# Patient Record
Sex: Male | Born: 1940 | Race: White | Hispanic: No | Marital: Single | Smoking: Former smoker
Health system: Southern US, Community
[De-identification: ages and names within clinical notes are randomized; demographics above are authoritative.]

## PROBLEM LIST (undated history)

## (undated) DIAGNOSIS — E785 Hyperlipidemia, unspecified: Secondary | ICD-10-CM

## (undated) DIAGNOSIS — N179 Acute kidney failure, unspecified: Secondary | ICD-10-CM

## (undated) DIAGNOSIS — R339 Retention of urine, unspecified: Secondary | ICD-10-CM

## (undated) DIAGNOSIS — I219 Acute myocardial infarction, unspecified: Secondary | ICD-10-CM

## (undated) DIAGNOSIS — M6282 Rhabdomyolysis: Secondary | ICD-10-CM

## (undated) DIAGNOSIS — R319 Hematuria, unspecified: Secondary | ICD-10-CM

## (undated) DIAGNOSIS — E119 Type 2 diabetes mellitus without complications: Secondary | ICD-10-CM

## (undated) DIAGNOSIS — I739 Peripheral vascular disease, unspecified: Secondary | ICD-10-CM

## (undated) DIAGNOSIS — I2510 Atherosclerotic heart disease of native coronary artery without angina pectoris: Secondary | ICD-10-CM

## (undated) DIAGNOSIS — K219 Gastro-esophageal reflux disease without esophagitis: Secondary | ICD-10-CM

## (undated) DIAGNOSIS — I10 Essential (primary) hypertension: Secondary | ICD-10-CM

## (undated) DIAGNOSIS — M6281 Muscle weakness (generalized): Secondary | ICD-10-CM

## (undated) DIAGNOSIS — R1310 Dysphagia, unspecified: Secondary | ICD-10-CM

## (undated) DIAGNOSIS — E870 Hyperosmolality and hypernatremia: Secondary | ICD-10-CM

## (undated) DIAGNOSIS — F419 Anxiety disorder, unspecified: Secondary | ICD-10-CM

## (undated) HISTORY — DX: Gastro-esophageal reflux disease without esophagitis: K21.9

## (undated) HISTORY — DX: Acute kidney failure, unspecified: N17.9

## (undated) HISTORY — DX: Essential (primary) hypertension: I10

## (undated) HISTORY — DX: Retention of urine, unspecified: R33.9

## (undated) HISTORY — DX: Hyperosmolality and hypernatremia: E87.0

## (undated) HISTORY — DX: Hyperlipidemia, unspecified: E78.5

## (undated) HISTORY — DX: Dysphagia, unspecified: R13.10

## (undated) HISTORY — PX: ABDOMINAL SURGERY: SHX537

## (undated) HISTORY — DX: Peripheral vascular disease, unspecified: I73.9

## (undated) HISTORY — DX: Rhabdomyolysis: M62.82

## (undated) HISTORY — DX: Muscle weakness (generalized): M62.81

## (undated) HISTORY — DX: Anxiety disorder, unspecified: F41.9

## (undated) HISTORY — DX: Type 2 diabetes mellitus without complications: E11.9

## (undated) HISTORY — DX: Acute myocardial infarction, unspecified: I21.9

## (undated) HISTORY — DX: Hematuria, unspecified: R31.9

## (undated) HISTORY — DX: Atherosclerotic heart disease of native coronary artery without angina pectoris: I25.10

## (undated) HISTORY — PX: OTHER SURGICAL HISTORY: SHX169

---

## 2011-01-10 ENCOUNTER — Inpatient Hospital Stay: Payer: Self-pay | Admitting: Internal Medicine

## 2011-01-10 DIAGNOSIS — I10 Essential (primary) hypertension: Secondary | ICD-10-CM

## 2011-01-18 LAB — PATHOLOGY REPORT

## 2011-01-21 LAB — PATHOLOGY REPORT

## 2011-01-24 ENCOUNTER — Emergency Department: Payer: Self-pay | Admitting: Emergency Medicine

## 2011-02-18 ENCOUNTER — Ambulatory Visit: Payer: Self-pay | Admitting: Vascular Surgery

## 2011-02-18 LAB — URINALYSIS, COMPLETE
Bacteria: NONE SEEN
Bilirubin,UR: NEGATIVE
Glucose,UR: NEGATIVE mg/dL (ref 0–75)
Nitrite: NEGATIVE
RBC,UR: <1 /HPF (ref 0–5)
Specific Gravity: 1.015 (ref 1.003–1.030)
WBC UR: 1 /HPF (ref 0–5)

## 2011-02-18 LAB — BASIC METABOLIC PANEL
BUN: 15 mg/dL (ref 7–18)
Calcium, Total: 9.6 mg/dL (ref 8.5–10.1)
Chloride: 100 mmol/L (ref 98–107)
Co2: 31 mmol/L (ref 21–32)
EGFR (Non-African Amer.): >60
Osmolality: 285 (ref 275–301)
Potassium: 3.8 mmol/L (ref 3.5–5.1)
Sodium: 141 mmol/L (ref 136–145)

## 2011-02-18 LAB — APTT: Activated PTT: 37.5 secs — ABNORMAL HIGH (ref 23.6–35.9)

## 2011-02-18 LAB — CBC
HCT: 32.6 % — ABNORMAL LOW (ref 40.0–52.0)
MCHC: 32.4 g/dL (ref 32.0–36.0)
RBC: 3.92 10*6/uL — ABNORMAL LOW (ref 4.40–5.90)
RDW: 14.7 % — ABNORMAL HIGH (ref 11.5–14.5)
WBC: 7.9 10*3/uL (ref 3.8–10.6)

## 2011-02-18 LAB — PROTIME-INR: INR: 1

## 2011-02-22 ENCOUNTER — Inpatient Hospital Stay: Payer: Self-pay | Admitting: Vascular Surgery

## 2011-02-24 LAB — CREATININE, SERUM
Creatinine: 0.56 mg/dL — ABNORMAL LOW (ref 0.60–1.30)
EGFR (Non-African Amer.): >60

## 2011-02-24 LAB — PLATELET COUNT: Platelet: 216 10*3/uL (ref 150–440)

## 2011-02-27 LAB — PATHOLOGY REPORT

## 2011-02-28 LAB — CREATININE, SERUM
Creatinine: 0.56 mg/dL — ABNORMAL LOW (ref 0.60–1.30)
EGFR (Non-African Amer.): >60

## 2011-03-19 ENCOUNTER — Ambulatory Visit: Payer: Self-pay | Admitting: Vascular Surgery

## 2011-03-19 LAB — BASIC METABOLIC PANEL
BUN: 9 mg/dL (ref 7–18)
Calcium, Total: 9.1 mg/dL (ref 8.5–10.1)
Chloride: 92 mmol/L — ABNORMAL LOW (ref 98–107)
Creatinine: 0.59 mg/dL — ABNORMAL LOW (ref 0.60–1.30)
EGFR (Non-African Amer.): >60
Sodium: 131 mmol/L — ABNORMAL LOW (ref 136–145)

## 2011-05-14 ENCOUNTER — Ambulatory Visit: Payer: Self-pay | Admitting: Podiatry

## 2011-05-14 LAB — CBC WITH DIFFERENTIAL/PLATELET
Basophil #: 0 10*3/uL (ref 0.0–0.1)
Basophil %: 0.3 %
Eosinophil #: 0.3 10*3/uL (ref 0.0–0.7)
Eosinophil %: 2.6 %
HGB: 11.4 g/dL — ABNORMAL LOW (ref 13.0–18.0)
Lymphocyte #: 1.3 10*3/uL (ref 1.0–3.6)
Lymphocyte %: 10.2 %
MCH: 26.7 pg (ref 26.0–34.0)
MCHC: 32.3 g/dL (ref 32.0–36.0)
MCV: 83 fL (ref 80–100)
Neutrophil #: 10.4 10*3/uL — ABNORMAL HIGH (ref 1.4–6.5)
Neutrophil %: 79 %
RBC: 4.27 10*6/uL — ABNORMAL LOW (ref 4.40–5.90)
WBC: 13.2 10*3/uL — ABNORMAL HIGH (ref 3.8–10.6)

## 2011-05-14 LAB — BASIC METABOLIC PANEL
Anion Gap: 7 (ref 7–16)
BUN: 22 mg/dL — ABNORMAL HIGH (ref 7–18)
Creatinine: 0.87 mg/dL (ref 0.60–1.30)
EGFR (African American): >60
EGFR (Non-African Amer.): >60
Glucose: 128 mg/dL — ABNORMAL HIGH (ref 65–99)
Osmolality: 284 (ref 275–301)

## 2011-05-15 ENCOUNTER — Ambulatory Visit: Payer: Self-pay | Admitting: Podiatry

## 2013-07-15 ENCOUNTER — Inpatient Hospital Stay: Payer: Self-pay | Admitting: Cardiothoracic Surgery

## 2013-07-15 LAB — BASIC METABOLIC PANEL
Anion Gap: 8 (ref 7–16)
BUN: 21 mg/dL — ABNORMAL HIGH (ref 7–18)
CO2: 29 mmol/L (ref 21–32)
Calcium, Total: 9.5 mg/dL (ref 8.5–10.1)
Chloride: 99 mmol/L (ref 98–107)
Creatinine: 1.1 mg/dL (ref 0.60–1.30)
EGFR (African American): >60
EGFR (Non-African Amer.): >60
Glucose: 275 mg/dL — ABNORMAL HIGH (ref 65–99)
Osmolality: 285 (ref 275–301)
Potassium: 4.2 mmol/L (ref 3.5–5.1)
SODIUM: 136 mmol/L (ref 136–145)

## 2013-07-15 LAB — CBC
HCT: 54.9 % — AB (ref 40.0–52.0)
HGB: 17.8 g/dL (ref 13.0–18.0)
MCH: 27.4 pg (ref 26.0–34.0)
MCHC: 32.4 g/dL (ref 32.0–36.0)
MCV: 84 fL (ref 80–100)
Platelet: 238 10*3/uL (ref 150–440)
RBC: 6.5 10*6/uL — ABNORMAL HIGH (ref 4.40–5.90)
RDW: 13.8 % (ref 11.5–14.5)
WBC: 15.4 10*3/uL — AB (ref 3.8–10.6)

## 2013-07-15 LAB — URINALYSIS, COMPLETE
BACTERIA: NONE SEEN
Bilirubin,UR: NEGATIVE
Glucose,UR: >=500 mg/dL (ref 0–75)
KETONE: NEGATIVE
NITRITE: POSITIVE
Ph: 7 (ref 4.5–8.0)
SPECIFIC GRAVITY: 1.009 (ref 1.003–1.030)
SQUAMOUS EPITHELIAL: NONE SEEN

## 2013-07-15 LAB — APTT: Activated PTT: 29 secs (ref 23.6–35.9)

## 2013-07-15 LAB — PROTIME-INR
INR: 1
PROTHROMBIN TIME: 12.6 s (ref 11.5–14.7)

## 2013-07-15 LAB — HEMOGLOBIN A1C: HEMOGLOBIN A1C: 9.9 % — AB (ref 4.2–6.3)

## 2013-07-17 LAB — CBC WITH DIFFERENTIAL/PLATELET
BASOS ABS: 0 10*3/uL (ref 0.0–0.1)
Basophil %: 0.5 %
Eosinophil #: 0.2 10*3/uL (ref 0.0–0.7)
Eosinophil %: 2.5 %
HCT: 41.8 % (ref 40.0–52.0)
HGB: 13.7 g/dL (ref 13.0–18.0)
Lymphocyte #: 0.6 10*3/uL — ABNORMAL LOW (ref 1.0–3.6)
Lymphocyte %: 6.5 %
MCH: 27.5 pg (ref 26.0–34.0)
MCHC: 32.7 g/dL (ref 32.0–36.0)
MCV: 84 fL (ref 80–100)
Monocyte #: 1.1 x10 3/mm — ABNORMAL HIGH (ref 0.2–1.0)
Monocyte %: 11.9 %
NEUTROS PCT: 78.6 %
Neutrophil #: 7.3 10*3/uL — ABNORMAL HIGH (ref 1.4–6.5)
Platelet: 139 10*3/uL — ABNORMAL LOW (ref 150–440)
RBC: 4.98 10*6/uL (ref 4.40–5.90)
RDW: 13.9 % (ref 11.5–14.5)
WBC: 9.3 10*3/uL (ref 3.8–10.6)

## 2013-07-19 LAB — URINE CULTURE

## 2013-09-04 IMAGING — US US EXTREM LOW VENOUS*R*
1 series · 17 of 24 positions shown · non-contrast
Comparison: none

REASON FOR EXAM: DVT
COMMENTS:

[Series 1: us extrem low venous*right* · 17 of 31 slices shown]
[im 1/31]
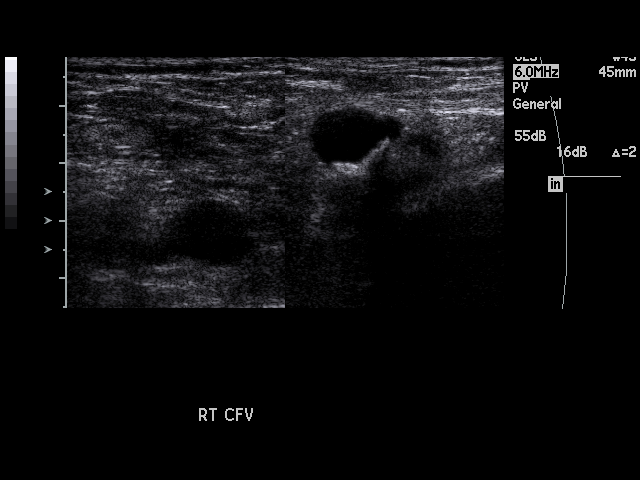
[im 3/31]
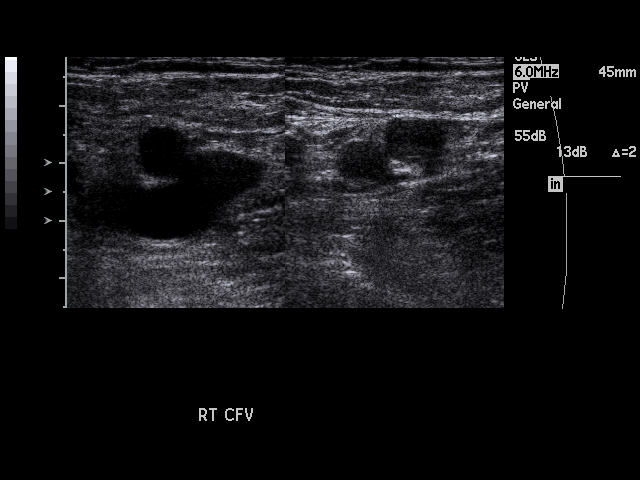
[im 4/31]
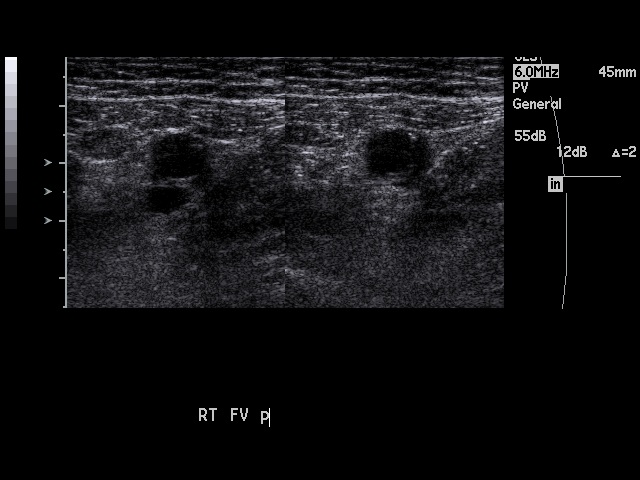
[im 6/31]
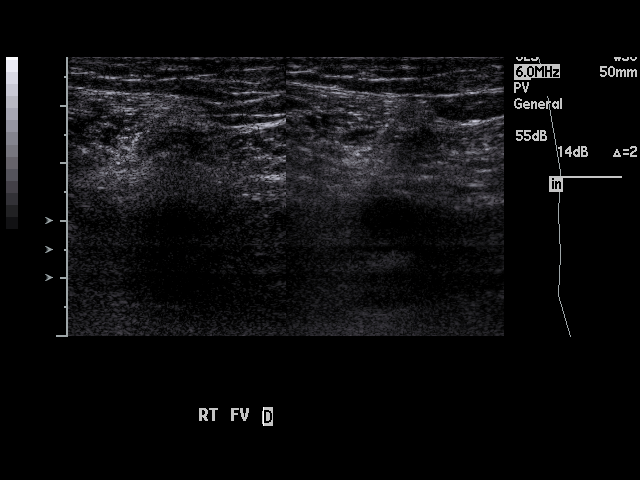
[im 8/31]
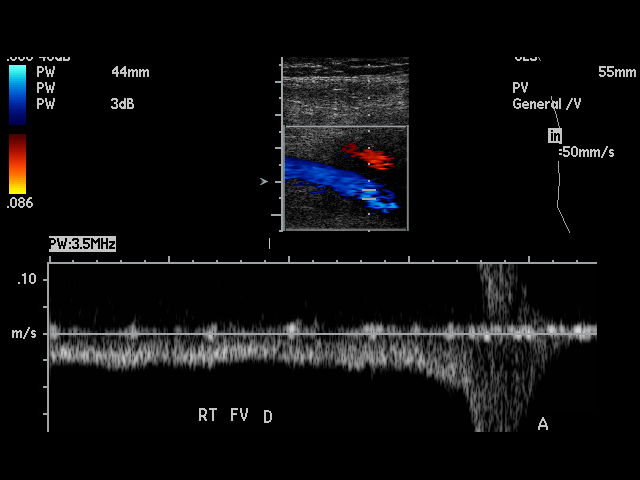
[im 10/31]
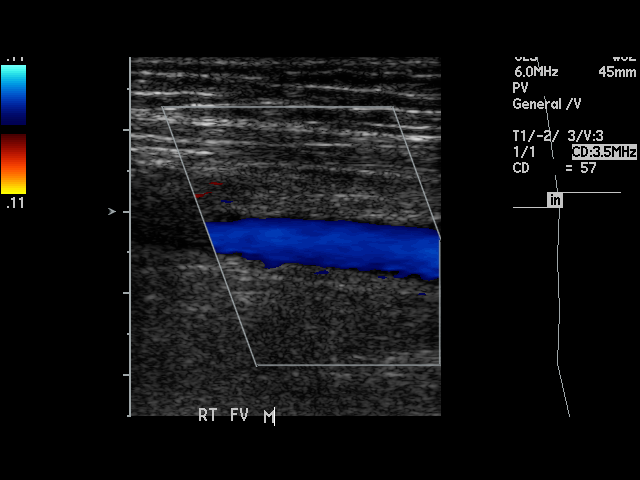
[im 12/31]
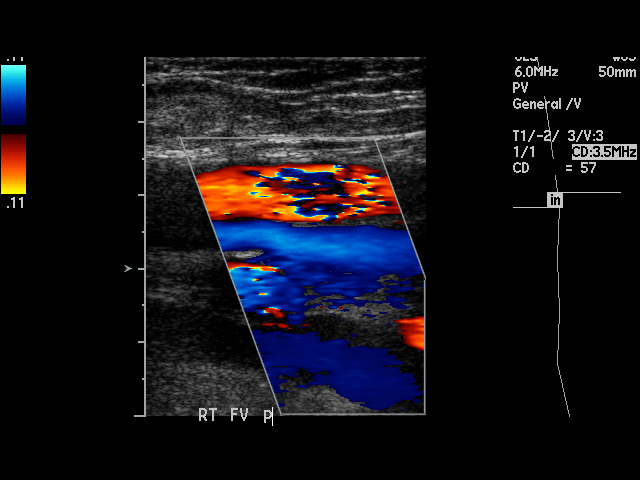
[im 14/31]
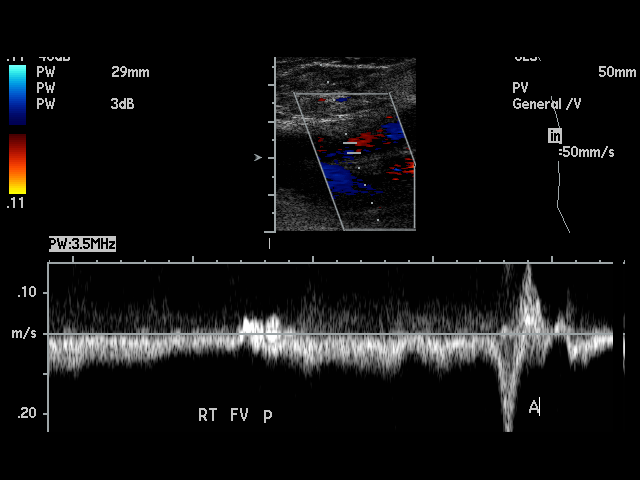
[im 16/31]
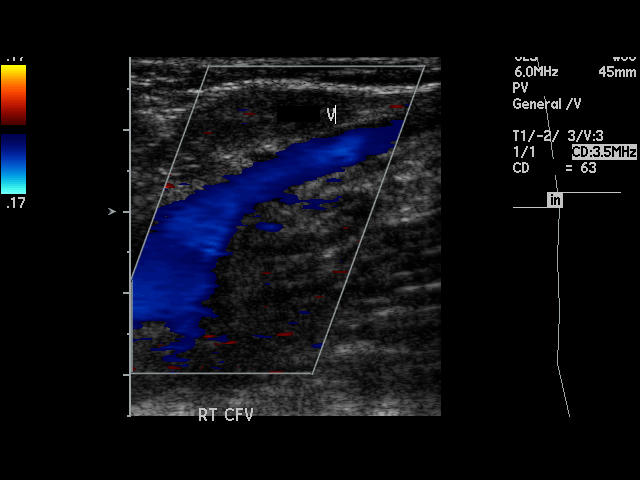
[im 17/31]
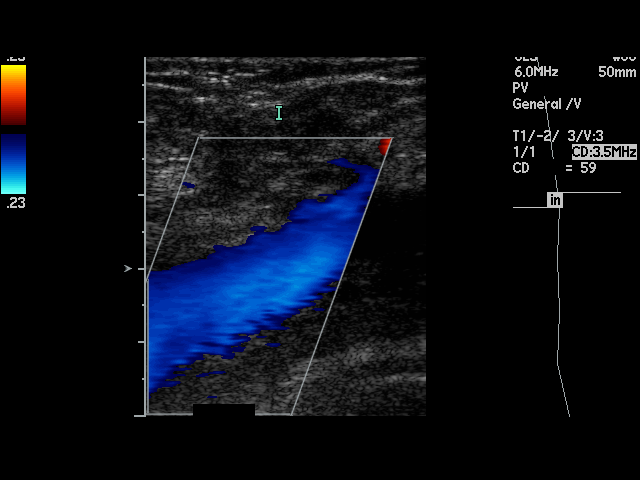
[im 19/31]
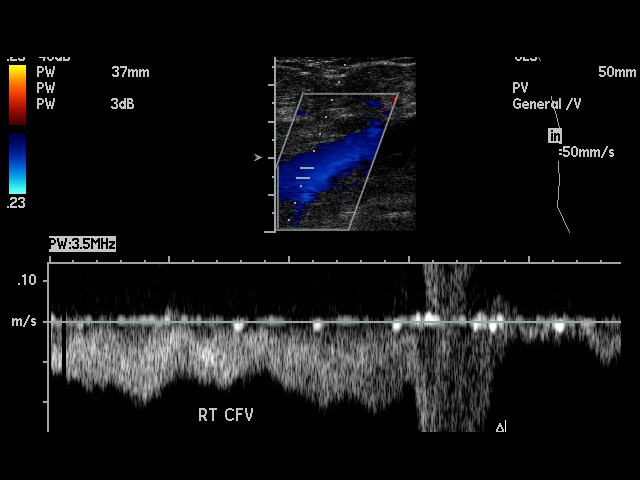
[im 21/31]
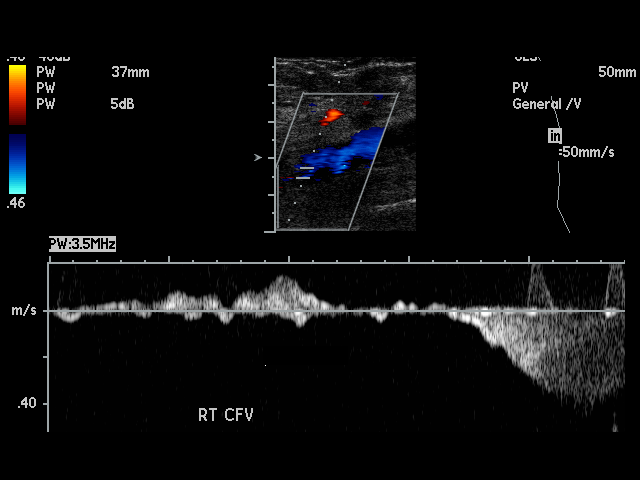
[im 23/31]
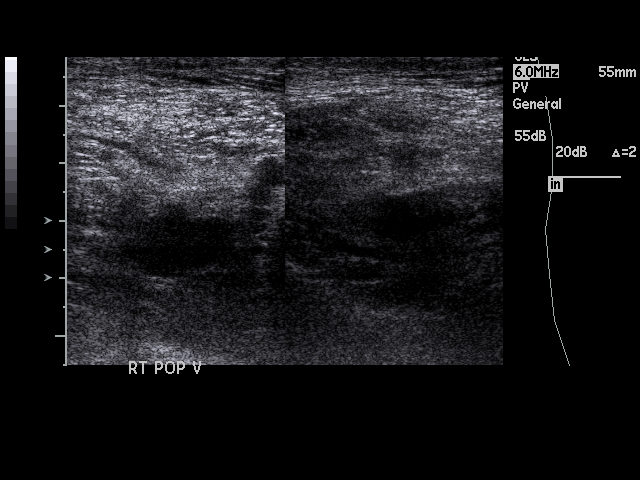
[im 25/31]
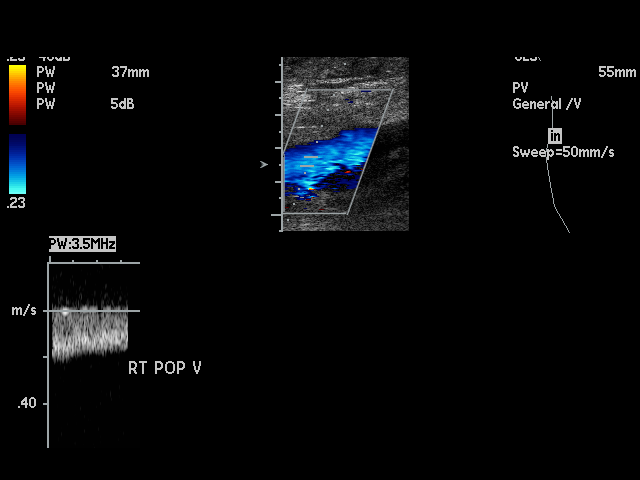
[im 27/31]
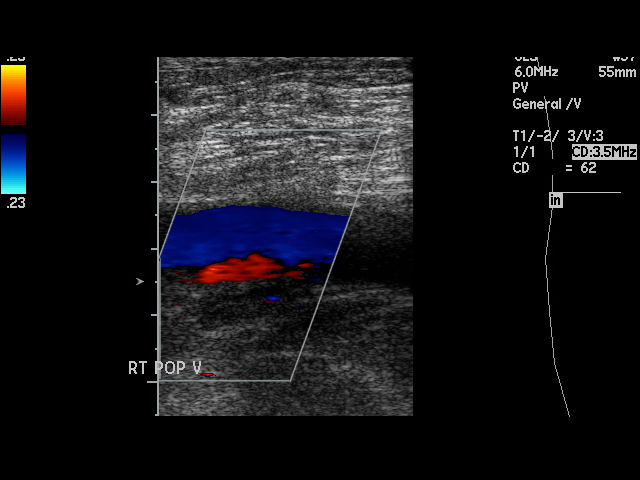
[im 28/31]
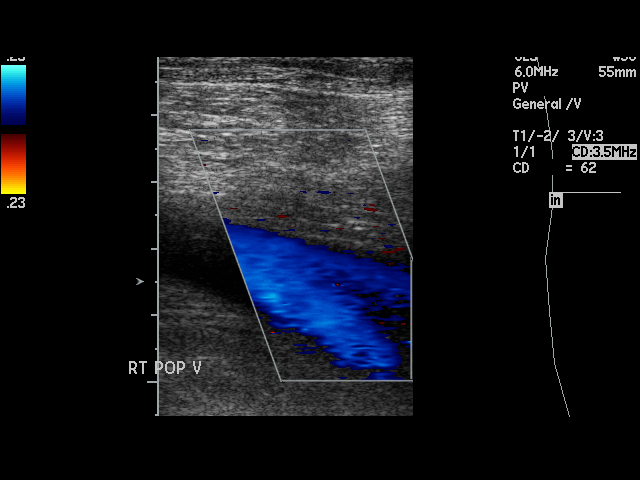
[im 31/31]
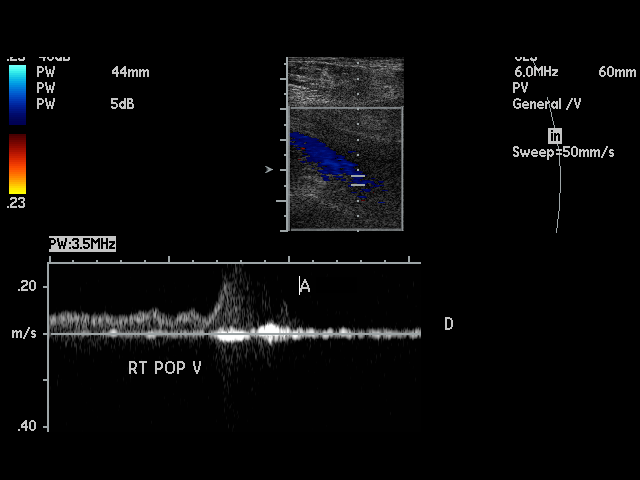

[17 of 24 positions shown; findings below may reference images not displayed]

PROCEDURE:     US  - US DOPPLER LOW EXTR RIGHT  - January 10, 2011  [DATE]

RESULT:     Comparison: None

Technique and findings: Multiple longitudinal and transverse grayscale as
well as color and spectral Doppler images of the right lower extremity veins
were obtained from the common femoral veins through the popliteal veins.

The right common femoral, femoral, and popliteal veins are patent,
demonstrating normal color-flow and compressibility. No intraluminal
thrombus is identified.  There is normal respiratory variation and
augmentation demonstrated at all vein levels.
IMPRESSION: No evidence of DVT in the right lower extremity.

## 2013-09-04 IMAGING — CR RIGHT FOOT COMPLETE - 3+ VIEW
3 series · 3 of 3 positions shown · non-contrast
Comparison: none

REASON FOR EXAM: osteomyelitis
COMMENTS:

PROCEDURE:     DXR - DXR FOOT RT COMPLETE W/OBLIQUES  - January 10, 2011 [DATE]
RESULT:     Comparison: None.

[ap]
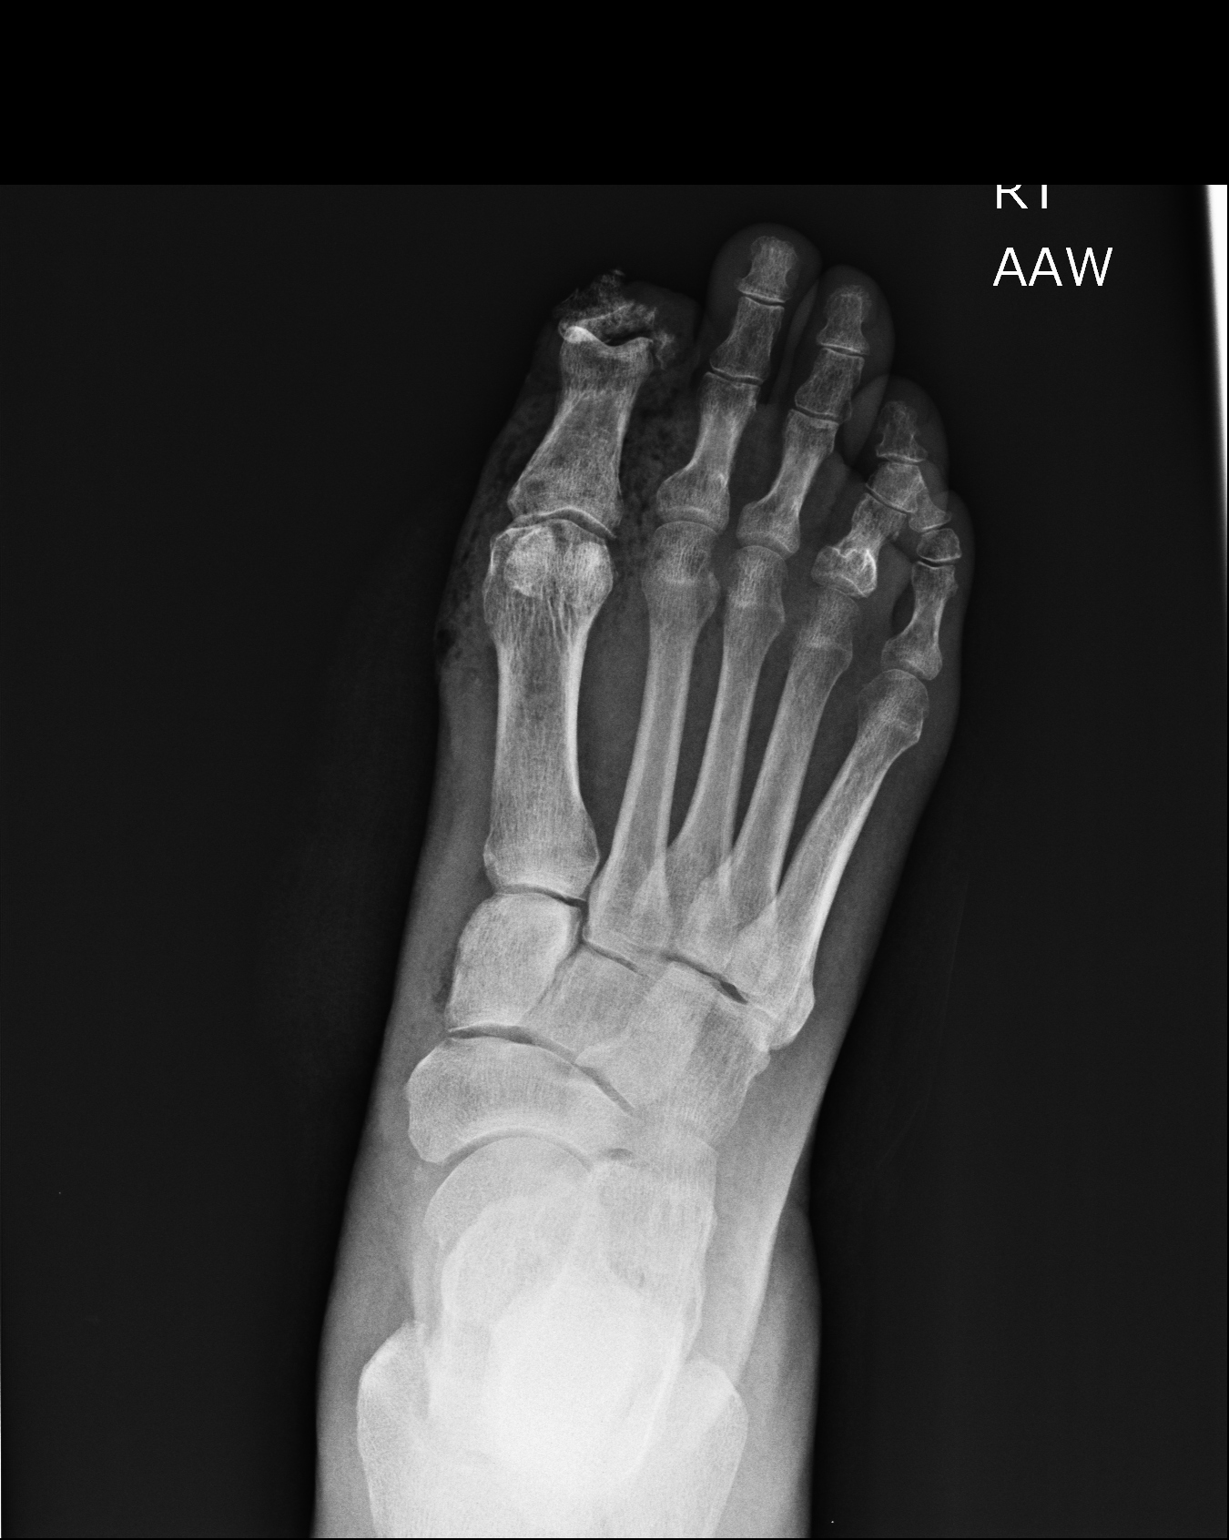

[oblique]
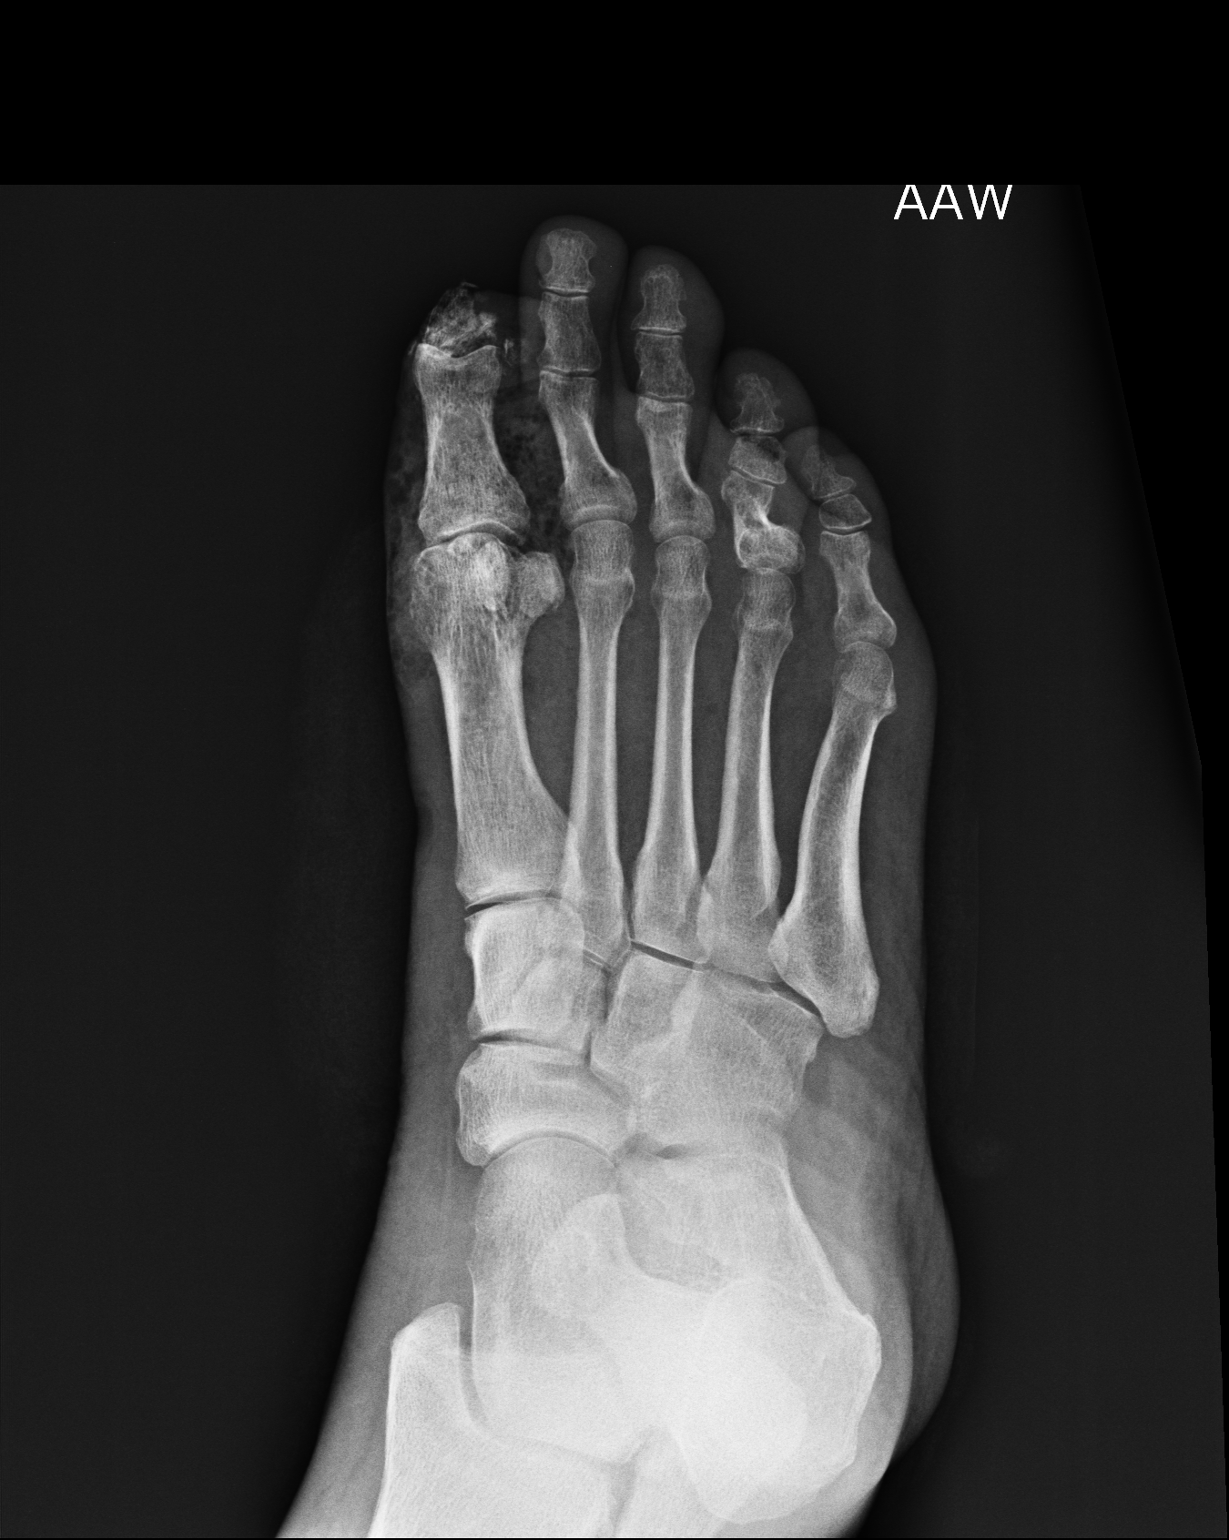

[lat]
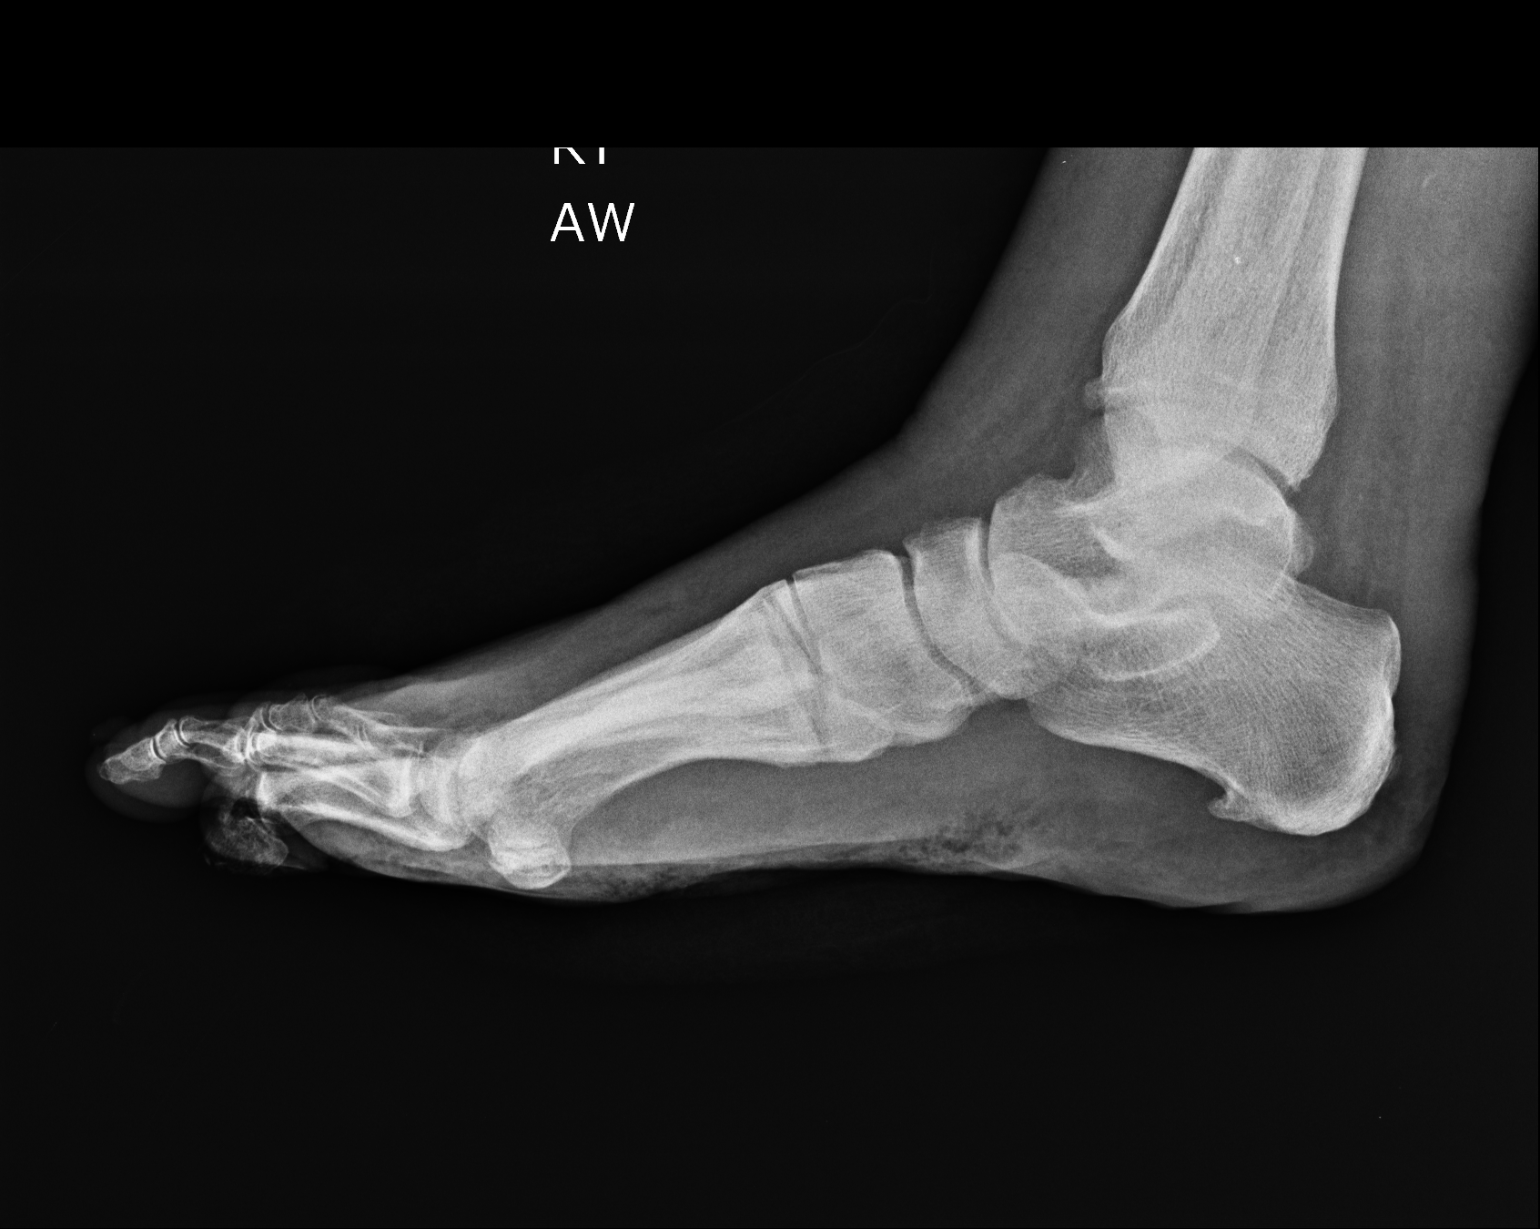

[3 of 3 positions shown; findings below may reference images not displayed]

FINDINGS: There is gas within the soft tissues of the great toe. This extends
proximally to the plantar aspect of the foot at the level of the middle
cuneiform. There is soft tissue defect of the great toe. There is truncation
of the distal phalanx of the great toe, which is partially exposed. There is
irregularity of the residual distal phalanx. This may represent
osteomyelitis or postoperative changes. Well corticated density lateral to
the proximal phalanx likely represent sequela of old prior trauma.
IMPRESSION: 1. Soft tissue gas in the great toe and medial foot, concerning for soft
tissue infection, including necrotizing infection such as necrotizing
fasciitis. Surgical consultation is recommended.
2. Truncation and irregularity of the distal phalanx of the great toe may be
secondary to osteomyelitis and/or postoperative changes.

This was called to Dr. Verna Lyon at 6647 hours 01/10/2011.

## 2013-09-04 IMAGING — US US CAROTID DUPLEX BILAT
1 series · 17 of 24 positions shown · non-contrast
Comparison: none

REASON FOR EXAM: CVA; PVD
COMMENTS:

PROCEDURE:     US  - US CAROTID DOPPLER BILATERAL  - January 10, 2011  [DATE]
RESULT:     Comparison: None
TECHNIQUE: Gray-scale, color Doppler, and spectral Doppler images were
obtained of the extracranial carotid artery systems and vertebral arteries
in the neck.

[Series 1: us carotid duplex bilat · 17 of 75 slices shown]
[im 1/75]
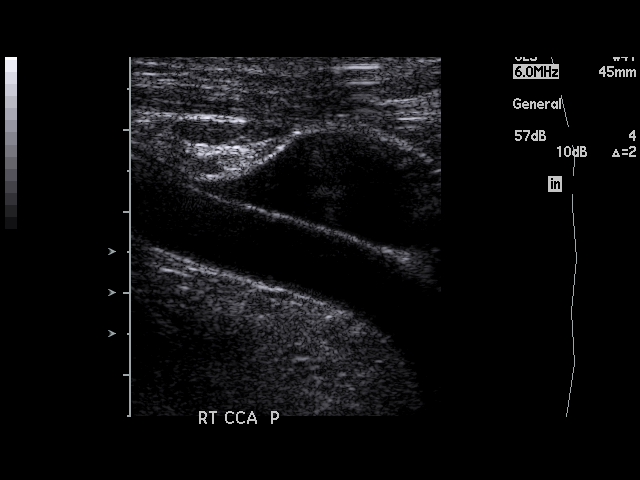
[im 7/75]
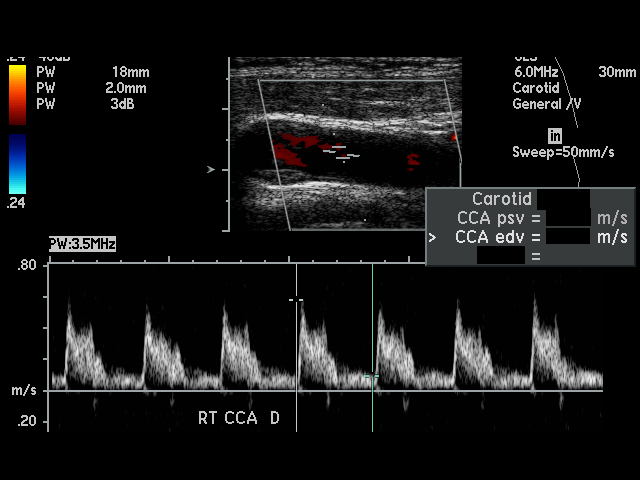
[im 10/75]
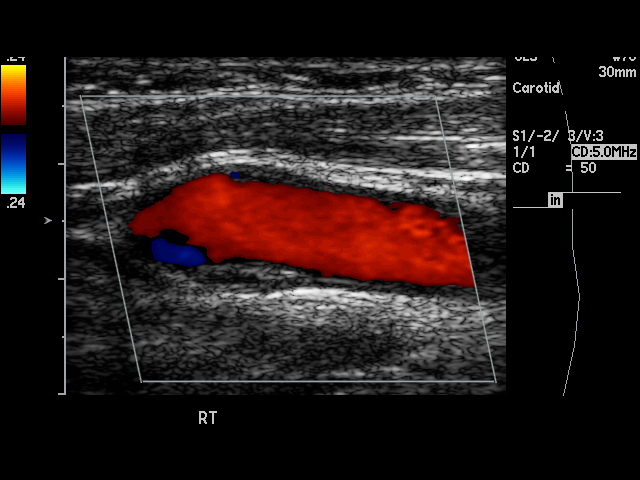
[im 13/75]
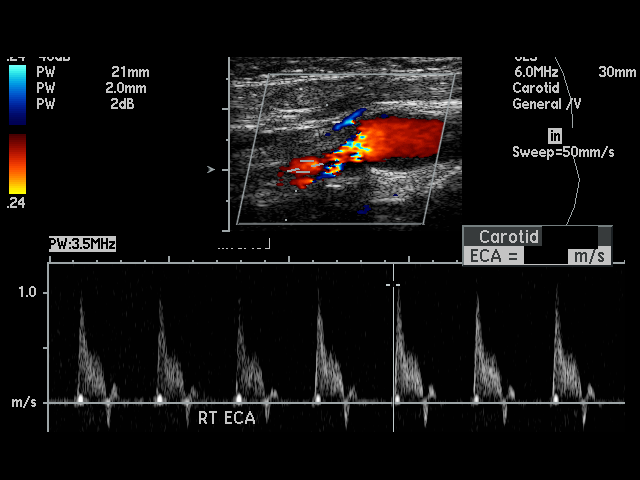
[im 20/75]
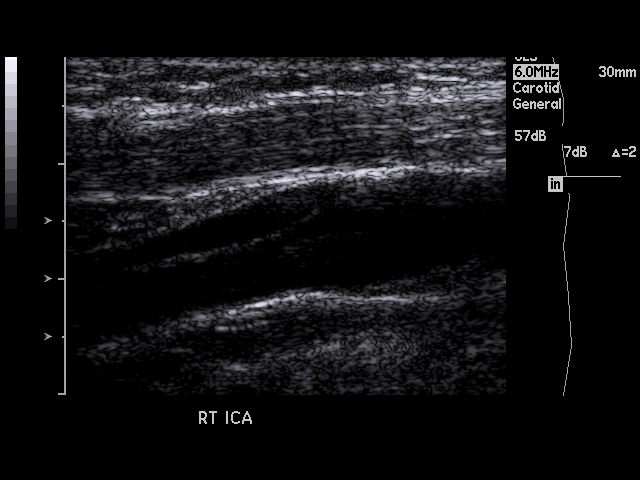
[im 23/75]
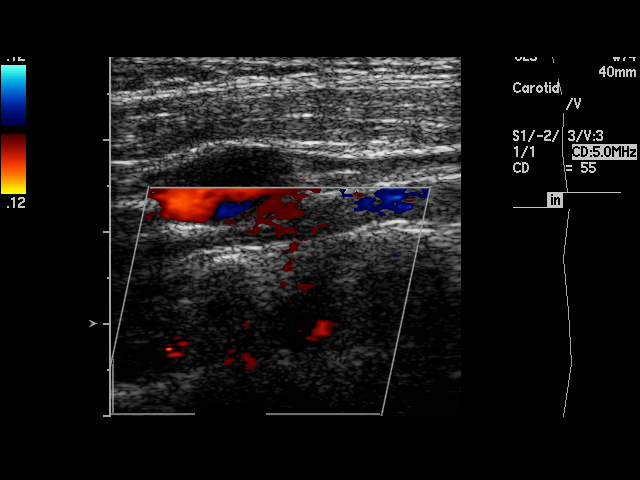
[im 29/75]
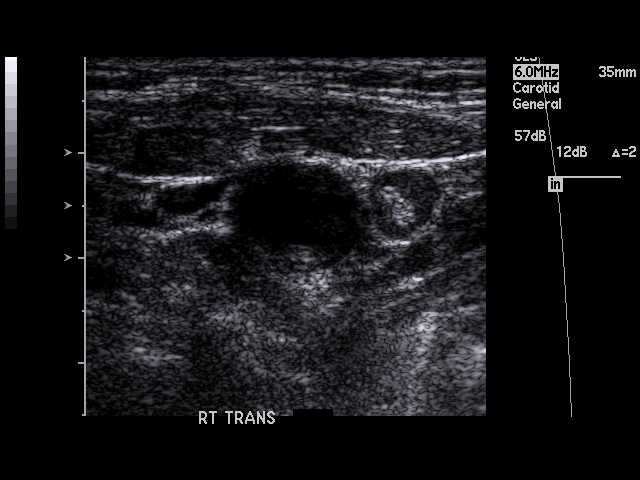
[im 33/75]
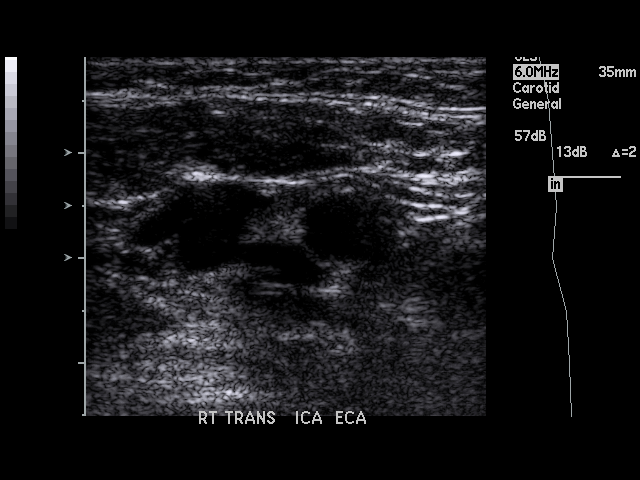
[im 39/75]
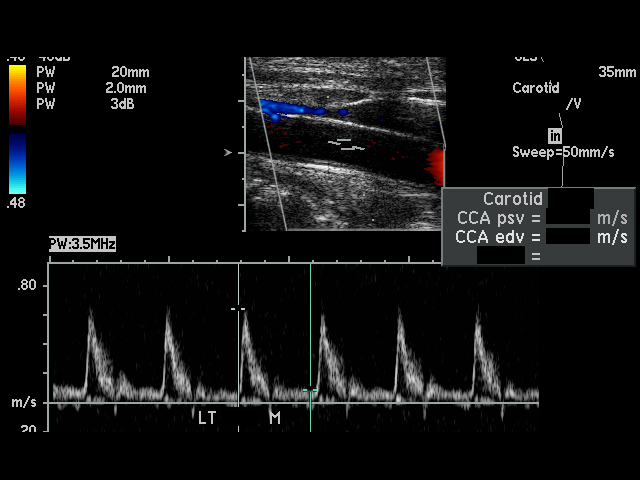
[im 42/75]
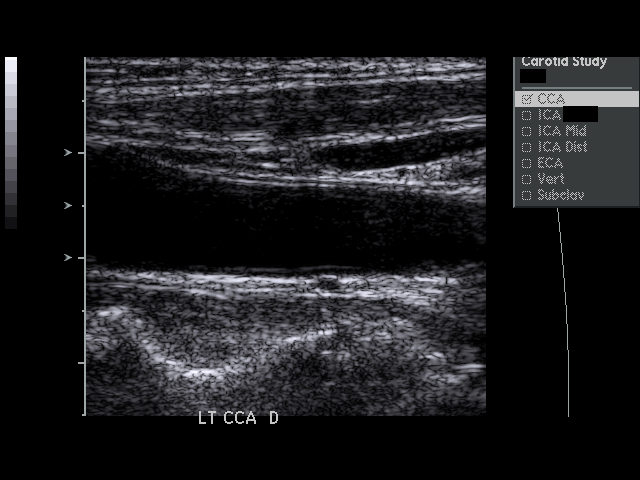
[im 46/75]
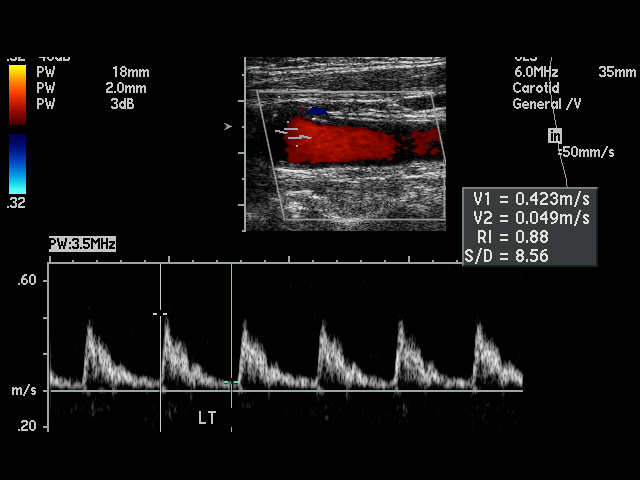
[im 52/75]
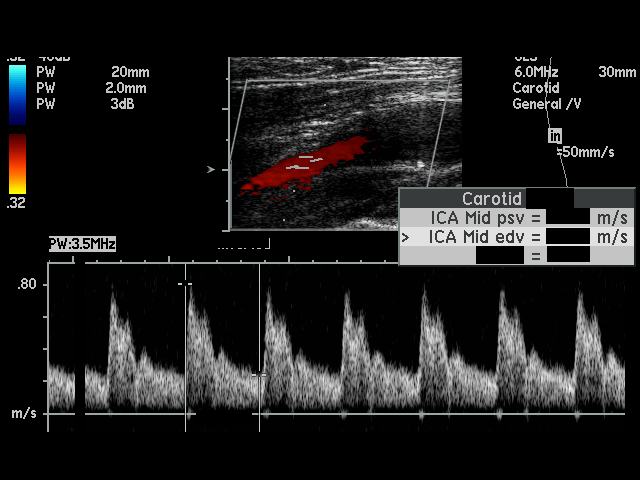
[im 55/75]
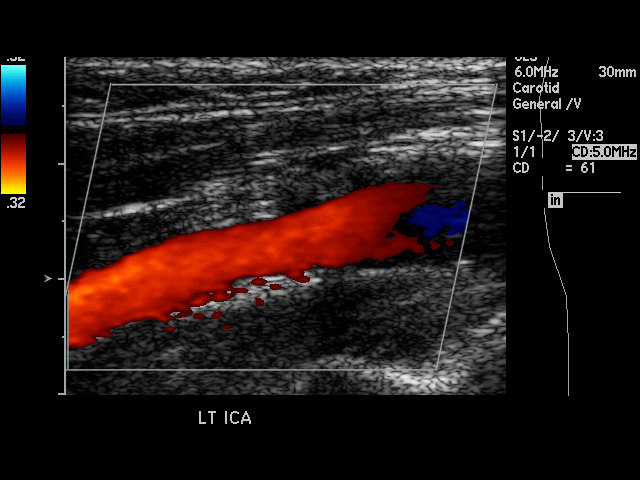
[im 62/75]
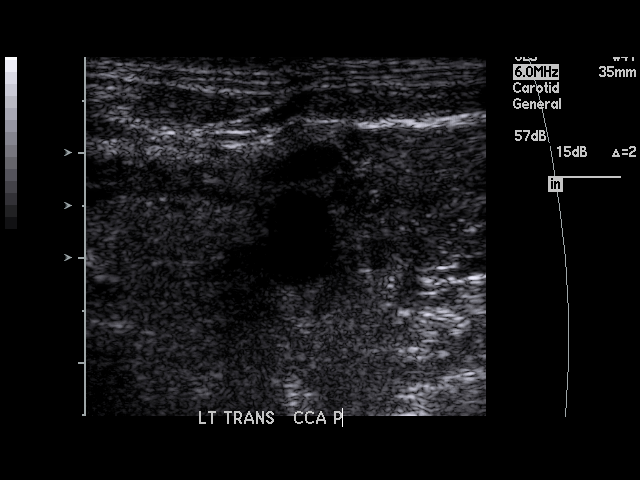
[im 65/75]
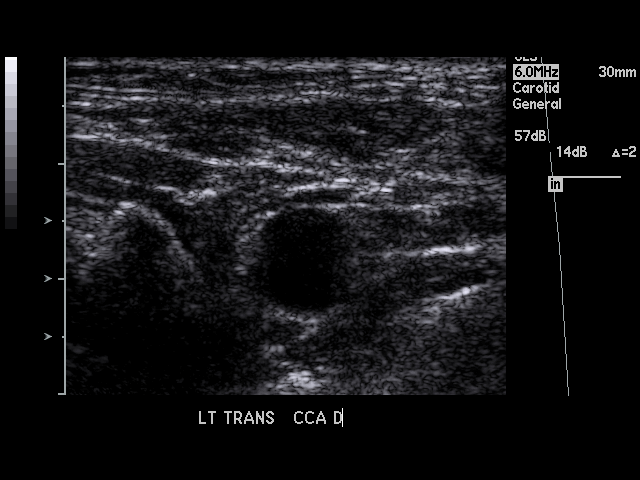
[im 68/75]
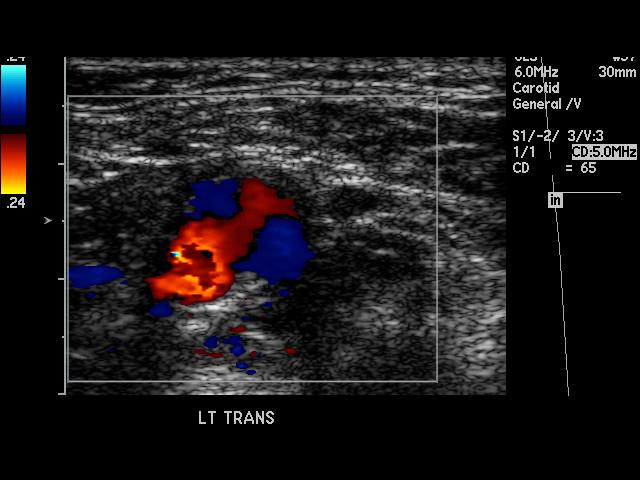
[im 75/75]
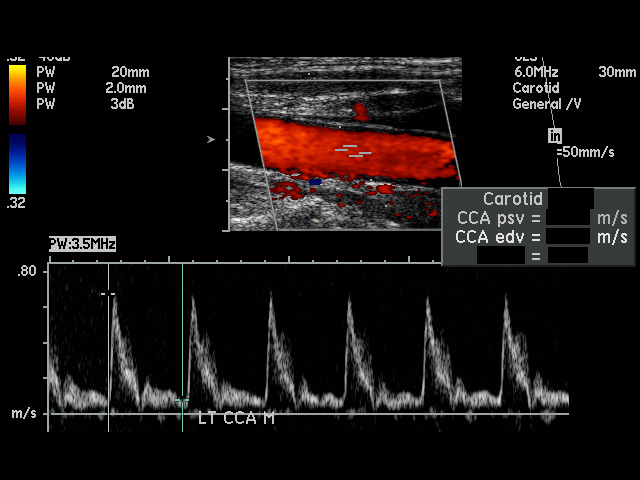

[17 of 24 positions shown; findings below may reference images not displayed]

FINDINGS: There are mild atherosclerotic plaques in the carotid bulbs and internal
carotid arteries. No significant atherosclerotic plaques identified in the
extracranial carotid arteries.  The peak systolic velocities are not
elevated.

The vertebral arteries are patent bilaterally.
IMPRESSION: No evidence of hemodynamically significant stenosis in the extracranial
carotid arteries.

## 2013-09-12 IMAGING — XA IR VASCULAR PROCEDURE
1 series · 1 of 1 positions shown · non-contrast
Comparison: none

[Series 2: single · 1 of 1 slices shown]
[im 1/1]
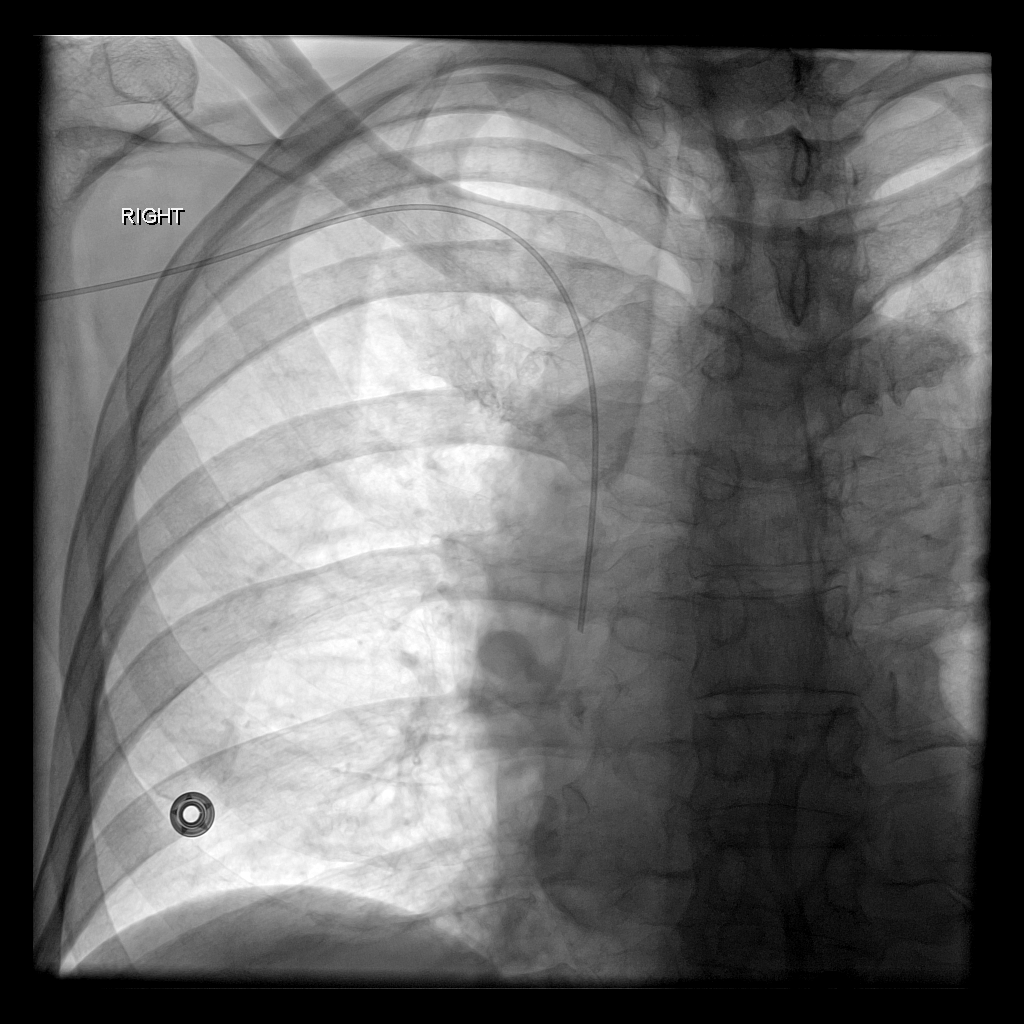

[1 of 1 positions shown; findings below may reference images not displayed]

IMAGES IMPORTED FROM THE [employer] WORKFLOW SYSTEM
NO DICTATION FOR STUDY

## 2013-12-12 ENCOUNTER — Ambulatory Visit: Payer: Self-pay | Admitting: Internal Medicine

## 2013-12-30 ENCOUNTER — Inpatient Hospital Stay: Payer: Self-pay | Admitting: Internal Medicine

## 2013-12-30 LAB — DIFFERENTIAL
Basophil #: 0 10*3/uL (ref 0.0–0.1)
Basophil %: 0.3 %
Eosinophil #: 0 10*3/uL (ref 0.0–0.7)
Eosinophil %: 0.1 %
Lymphocyte #: 0.3 10*3/uL — ABNORMAL LOW (ref 1.0–3.6)
Lymphocyte %: 1.6 %
Monocyte #: 0.5 x10 3/mm (ref 0.2–1.0)
Monocyte %: 3 %
NEUTROS ABS: 15.1 10*3/uL — AB (ref 1.4–6.5)
Neutrophil %: 95 %

## 2013-12-30 LAB — DRUG SCREEN, URINE

## 2013-12-30 LAB — TROPONIN I
Troponin-I: 29 ng/mL — ABNORMAL HIGH
Troponin-I: 16 ng/mL — ABNORMAL HIGH
Troponin-I: 21 ng/mL — ABNORMAL HIGH

## 2013-12-30 LAB — URINALYSIS, COMPLETE
RBC,UR: 2808 /HPF (ref 0–5)
Specific Gravity: 1.016 (ref 1.003–1.030)
Squamous Epithelial: NONE SEEN
WBC UR: 2128 /HPF (ref 0–5)

## 2013-12-30 LAB — COMPREHENSIVE METABOLIC PANEL
ALBUMIN: 2.9 g/dL — AB (ref 3.4–5.0)
ALK PHOS: 164 U/L — AB
Anion Gap: 17 — ABNORMAL HIGH (ref 7–16)
BILIRUBIN TOTAL: 2.2 mg/dL — AB (ref 0.2–1.0)
BUN: 67 mg/dL — AB (ref 7–18)
CHLORIDE: 105 mmol/L (ref 98–107)
Calcium, Total: 8.4 mg/dL — ABNORMAL LOW (ref 8.5–10.1)
Co2: 18 mmol/L — ABNORMAL LOW (ref 21–32)
Creatinine: 5.06 mg/dL — ABNORMAL HIGH (ref 0.60–1.30)
EGFR (Non-African Amer.): 12 — ABNORMAL LOW
GFR CALC AF AMER: 14 — AB
Glucose: 189 mg/dL — ABNORMAL HIGH (ref 65–99)
OSMOLALITY: 304 (ref 275–301)
POTASSIUM: 4.9 mmol/L (ref 3.5–5.1)
SGOT(AST): 318 U/L — ABNORMAL HIGH (ref 15–37)
SGPT (ALT): 97 U/L — ABNORMAL HIGH
SODIUM: 140 mmol/L (ref 136–145)
Total Protein: 6.7 g/dL (ref 6.4–8.2)

## 2013-12-30 LAB — PROTIME-INR
INR: 1.5
Prothrombin Time: 17.6 secs — ABNORMAL HIGH (ref 11.5–14.7)

## 2013-12-30 LAB — CK: CK, TOTAL: 8635 U/L — AB

## 2013-12-30 LAB — CBC
HCT: 46.1 % (ref 40.0–52.0)
HGB: 14.8 g/dL (ref 13.0–18.0)
MCH: 27.9 pg (ref 26.0–34.0)
MCHC: 32.1 g/dL (ref 32.0–36.0)
MCV: 87 fL (ref 80–100)
Platelet: 91 10*3/uL — ABNORMAL LOW (ref 150–440)
RBC: 5.3 10*6/uL (ref 4.40–5.90)
RDW: 14.2 % (ref 11.5–14.5)
WBC: 15.9 10*3/uL — ABNORMAL HIGH (ref 3.8–10.6)

## 2013-12-30 LAB — AMMONIA

## 2013-12-30 LAB — CK-MB
CK-MB: 26.3 ng/mL — AB (ref 0.5–3.6)
CK-MB: 22 ng/mL — AB (ref 0.5–3.6)
CK-MB: 43.4 ng/mL — ABNORMAL HIGH (ref 0.5–3.6)

## 2013-12-30 LAB — APTT: ACTIVATED PTT: 35.1 s (ref 23.6–35.9)

## 2013-12-30 LAB — ETHANOL: Ethanol: <3 mg/dL

## 2013-12-31 ENCOUNTER — Other Ambulatory Visit: Payer: Self-pay | Admitting: Physician Assistant

## 2013-12-31 DIAGNOSIS — I340 Nonrheumatic mitral (valve) insufficiency: Secondary | ICD-10-CM

## 2013-12-31 DIAGNOSIS — R570 Cardiogenic shock: Secondary | ICD-10-CM

## 2013-12-31 DIAGNOSIS — N179 Acute kidney failure, unspecified: Secondary | ICD-10-CM

## 2013-12-31 DIAGNOSIS — I214 Non-ST elevation (NSTEMI) myocardial infarction: Secondary | ICD-10-CM

## 2013-12-31 LAB — COMPREHENSIVE METABOLIC PANEL
ALK PHOS: 102 U/L
Albumin: 2.2 g/dL — ABNORMAL LOW (ref 3.4–5.0)
Anion Gap: 14 (ref 7–16)
BUN: 72 mg/dL — ABNORMAL HIGH (ref 7–18)
Bilirubin,Total: 2.9 mg/dL — ABNORMAL HIGH (ref 0.2–1.0)
CALCIUM: 6.6 mg/dL — AB (ref 8.5–10.1)
CO2: 20 mmol/L — AB (ref 21–32)
Chloride: 104 mmol/L (ref 98–107)
Creatinine: 4.15 mg/dL — ABNORMAL HIGH (ref 0.60–1.30)
EGFR (African American): 18 — ABNORMAL LOW
GFR CALC NON AF AMER: 15 — AB
GLUCOSE: 386 mg/dL — AB (ref 65–99)
OSMOLALITY: 313 (ref 275–301)
Potassium: 5 mmol/L (ref 3.5–5.1)
SGOT(AST): 277 U/L — ABNORMAL HIGH (ref 15–37)
SGPT (ALT): 88 U/L — ABNORMAL HIGH
Sodium: 138 mmol/L (ref 136–145)
Total Protein: 5.7 g/dL — ABNORMAL LOW (ref 6.4–8.2)

## 2013-12-31 LAB — PROTIME-INR
INR: 1.3
Prothrombin Time: 15.7 secs — ABNORMAL HIGH (ref 11.5–14.7)

## 2013-12-31 LAB — CBC WITH DIFFERENTIAL/PLATELET
Basophil #: 0 10*3/uL (ref 0.0–0.1)
Basophil %: 0.2 %
EOS ABS: 0.2 10*3/uL (ref 0.0–0.7)
EOS PCT: 0.7 %
HCT: 41.5 % (ref 40.0–52.0)
HGB: 13.3 g/dL (ref 13.0–18.0)
LYMPHS ABS: 0.5 10*3/uL — AB (ref 1.0–3.6)
Lymphocyte %: 1.9 %
MCH: 27.9 pg (ref 26.0–34.0)
MCHC: 32.1 g/dL (ref 32.0–36.0)
MCV: 87 fL (ref 80–100)
Monocyte #: 0.6 x10 3/mm (ref 0.2–1.0)
Monocyte %: 2.5 %
Neutrophil #: 22.9 10*3/uL — ABNORMAL HIGH (ref 1.4–6.5)
Neutrophil %: 94.7 %
Platelet: 80 10*3/uL — ABNORMAL LOW (ref 150–440)
RBC: 4.77 10*6/uL (ref 4.40–5.90)
RDW: 14.6 % — AB (ref 11.5–14.5)
WBC: 24.2 10*3/uL — AB (ref 3.8–10.6)

## 2013-12-31 LAB — TSH: Thyroid Stimulating Horm: 2.13 u[IU]/mL

## 2013-12-31 LAB — PHOSPHORUS: PHOSPHORUS: 5 mg/dL — AB (ref 2.5–4.9)

## 2013-12-31 LAB — CK: CK, Total: 4554 U/L — ABNORMAL HIGH

## 2013-12-31 LAB — MAGNESIUM: MAGNESIUM: 2.2 mg/dL

## 2014-01-01 DIAGNOSIS — R7989 Other specified abnormal findings of blood chemistry: Secondary | ICD-10-CM

## 2014-01-01 DIAGNOSIS — J9600 Acute respiratory failure, unspecified whether with hypoxia or hypercapnia: Secondary | ICD-10-CM | POA: Diagnosis not present

## 2014-01-01 LAB — BASIC METABOLIC PANEL
Anion Gap: 9 (ref 7–16)
Anion Gap: 8 (ref 7–16)
BUN: 68 mg/dL — ABNORMAL HIGH (ref 7–18)
BUN: 71 mg/dL — ABNORMAL HIGH (ref 7–18)
CALCIUM: 6.4 mg/dL — AB (ref 8.5–10.1)
CHLORIDE: 103 mmol/L (ref 98–107)
CHLORIDE: 107 mmol/L (ref 98–107)
CO2: 30 mmol/L (ref 21–32)
CO2: 30 mmol/L (ref 21–32)
Calcium, Total: 7.3 mg/dL — ABNORMAL LOW (ref 8.5–10.1)
Creatinine: 3.26 mg/dL — ABNORMAL HIGH (ref 0.60–1.30)
Creatinine: 3.01 mg/dL — ABNORMAL HIGH (ref 0.60–1.30)
EGFR (African American): 26 — ABNORMAL LOW
GFR CALC AF AMER: 24 — AB
GFR CALC NON AF AMER: 20 — AB
GFR CALC NON AF AMER: 22 — AB
GLUCOSE: 124 mg/dL — AB (ref 65–99)
Glucose: 157 mg/dL — ABNORMAL HIGH (ref 65–99)
OSMOLALITY: 304 (ref 275–301)
Osmolality: 313 (ref 275–301)
POTASSIUM: 3.7 mmol/L (ref 3.5–5.1)
POTASSIUM: 3.7 mmol/L (ref 3.5–5.1)
SODIUM: 142 mmol/L (ref 136–145)
Sodium: 145 mmol/L (ref 136–145)

## 2014-01-01 LAB — CBC WITH DIFFERENTIAL/PLATELET
Basophil #: 0 x10 3/mm 3
Basophil %: 0.1 %
Eosinophil #: 0 x10 3/mm 3
Eosinophil %: 0 %
HCT: 38.4 % — ABNORMAL LOW
HGB: 12.6 g/dL — ABNORMAL LOW
Lymphocyte %: 2.1 %
Lymphs Abs: 0.4 x10 3/mm 3 — ABNORMAL LOW
MCH: 27.7 pg
MCHC: 33 g/dL
MCV: 84 fL
Monocyte #: 1.1 "x10 3/mm " — ABNORMAL HIGH
Monocyte %: 5.9 %
Neutrophil #: 17.8 x10 3/mm 3 — ABNORMAL HIGH
Neutrophil %: 91.9 %
Platelet: 64 x10 3/mm 3 — ABNORMAL LOW
RBC: 4.57 x10 6/mm 3
RDW: 13.8 %
WBC: 19.4 x10 3/mm 3 — ABNORMAL HIGH

## 2014-01-01 LAB — URINE CULTURE

## 2014-01-01 LAB — CULTURE, BLOOD (SINGLE)

## 2014-01-02 DIAGNOSIS — J9600 Acute respiratory failure, unspecified whether with hypoxia or hypercapnia: Secondary | ICD-10-CM | POA: Diagnosis not present

## 2014-01-02 LAB — COMPREHENSIVE METABOLIC PANEL
ALBUMIN: 1.8 g/dL — AB (ref 3.4–5.0)
ALT: 64 U/L — AB
AST: 91 U/L — AB (ref 15–37)
Alkaline Phosphatase: 70 U/L
Anion Gap: 11 (ref 7–16)
BILIRUBIN TOTAL: 1.7 mg/dL — AB (ref 0.2–1.0)
BUN: 79 mg/dL — ABNORMAL HIGH (ref 7–18)
CHLORIDE: 107 mmol/L (ref 98–107)
Calcium, Total: 6.8 mg/dL — CL (ref 8.5–10.1)
Co2: 26 mmol/L (ref 21–32)
Creatinine: 3.04 mg/dL — ABNORMAL HIGH (ref 0.60–1.30)
EGFR (African American): 26 — ABNORMAL LOW
EGFR (Non-African Amer.): 22 — ABNORMAL LOW
GLUCOSE: 183 mg/dL — AB (ref 65–99)
OSMOLALITY: 315 (ref 275–301)
POTASSIUM: 4.4 mmol/L (ref 3.5–5.1)
SODIUM: 144 mmol/L (ref 136–145)
Total Protein: 5.1 g/dL — ABNORMAL LOW (ref 6.4–8.2)

## 2014-01-02 LAB — CBC WITH DIFFERENTIAL/PLATELET
Bands: 4 %
Basophil #: 0 10*3/uL (ref 0.0–0.1)
Basophil %: 0.3 %
Comment - H1-Com2: NORMAL
EOS ABS: 0 10*3/uL (ref 0.0–0.7)
Eosinophil %: 0 %
HCT: 39.2 % — ABNORMAL LOW (ref 40.0–52.0)
HGB: 12.9 g/dL — ABNORMAL LOW (ref 13.0–18.0)
Lymphocyte #: 0.4 10*3/uL — ABNORMAL LOW (ref 1.0–3.6)
Lymphocyte %: 3.2 %
Lymphocytes: 4 %
MCH: 28.2 pg (ref 26.0–34.0)
MCHC: 33 g/dL (ref 32.0–36.0)
MCV: 86 fL (ref 80–100)
MONOS PCT: 3.7 %
Monocyte #: 0.5 x10 3/mm (ref 0.2–1.0)
Neutrophil #: 11.5 10*3/uL — ABNORMAL HIGH (ref 1.4–6.5)
Neutrophil %: 92.8 %
PLATELETS: 55 10*3/uL — AB (ref 150–440)
RBC: 4.58 10*6/uL (ref 4.40–5.90)
RDW: 14.3 % (ref 11.5–14.5)
Segmented Neutrophils: 92 %
WBC: 12.4 10*3/uL — AB (ref 3.8–10.6)

## 2014-01-02 LAB — AMYLASE: Amylase: 12 U/L — ABNORMAL LOW (ref 25–115)

## 2014-01-02 LAB — MAGNESIUM: Magnesium: 2.6 mg/dL — ABNORMAL HIGH

## 2014-01-02 LAB — CK: CK, Total: 775 U/L — ABNORMAL HIGH (ref 39–308)

## 2014-01-02 LAB — LIPASE, BLOOD: Lipase: 35 U/L — ABNORMAL LOW (ref 73–393)

## 2014-01-02 LAB — PHOSPHORUS: PHOSPHORUS: 4.2 mg/dL (ref 2.5–4.9)

## 2014-01-03 LAB — CBC WITH DIFFERENTIAL/PLATELET
Basophil #: 0 10*3/uL (ref 0.0–0.1)
Basophil %: 0.1 %
Eosinophil #: 0 10*3/uL (ref 0.0–0.7)
Eosinophil %: 0.1 %
HCT: 38.6 % — ABNORMAL LOW (ref 40.0–52.0)
HGB: 12.5 g/dL — AB (ref 13.0–18.0)
Lymphocyte #: 0.3 10*3/uL — ABNORMAL LOW (ref 1.0–3.6)
Lymphocyte %: 3.2 %
MCH: 28.1 pg (ref 26.0–34.0)
MCHC: 32.5 g/dL (ref 32.0–36.0)
MCV: 86 fL (ref 80–100)
MONO ABS: 0.5 x10 3/mm (ref 0.2–1.0)
Monocyte %: 5.8 %
Neutrophil #: 7.9 10*3/uL — ABNORMAL HIGH (ref 1.4–6.5)
Neutrophil %: 90.8 %
Platelet: 58 10*3/uL — ABNORMAL LOW (ref 150–440)
RBC: 4.47 10*6/uL (ref 4.40–5.90)
RDW: 14.1 % (ref 11.5–14.5)
WBC: 8.8 10*3/uL (ref 3.8–10.6)

## 2014-01-03 LAB — BASIC METABOLIC PANEL
ANION GAP: 7 (ref 7–16)
BUN: 78 mg/dL — ABNORMAL HIGH (ref 7–18)
Calcium, Total: 6.9 mg/dL — CL (ref 8.5–10.1)
Chloride: 111 mmol/L — ABNORMAL HIGH (ref 98–107)
Co2: 29 mmol/L (ref 21–32)
Creatinine: 2.55 mg/dL — ABNORMAL HIGH (ref 0.60–1.30)
EGFR (African American): 32 — ABNORMAL LOW
GFR CALC NON AF AMER: 26 — AB
Glucose: 216 mg/dL — ABNORMAL HIGH (ref 65–99)
Osmolality: 322 (ref 275–301)
POTASSIUM: 4.2 mmol/L (ref 3.5–5.1)
Sodium: 147 mmol/L — ABNORMAL HIGH (ref 136–145)

## 2014-01-03 LAB — PHOSPHORUS: PHOSPHORUS: 4 mg/dL (ref 2.5–4.9)

## 2014-01-03 LAB — MAGNESIUM: Magnesium: 2.6 mg/dL — ABNORMAL HIGH

## 2014-01-03 LAB — EXPECTORATED SPUTUM ASSESSMENT W GRAM STAIN, RFLX TO RESP C

## 2014-01-04 LAB — RENAL FUNCTION PANEL
ALBUMIN: 1.8 g/dL — AB (ref 3.4–5.0)
ANION GAP: 6 — AB (ref 7–16)
BUN: 77 mg/dL — AB (ref 7–18)
Calcium, Total: 7.1 mg/dL — ABNORMAL LOW (ref 8.5–10.1)
Chloride: 110 mmol/L — ABNORMAL HIGH (ref 98–107)
Co2: 29 mmol/L (ref 21–32)
Creatinine: 2.13 mg/dL — ABNORMAL HIGH (ref 0.60–1.30)
EGFR (African American): 39 — ABNORMAL LOW
GFR CALC NON AF AMER: 32 — AB
GLUCOSE: 248 mg/dL — AB (ref 65–99)
OSMOLALITY: 320 (ref 275–301)
PHOSPHORUS: 3.4 mg/dL (ref 2.5–4.9)
Potassium: 4 mmol/L (ref 3.5–5.1)
Sodium: 145 mmol/L (ref 136–145)

## 2014-01-05 DIAGNOSIS — I214 Non-ST elevation (NSTEMI) myocardial infarction: Secondary | ICD-10-CM

## 2014-01-05 LAB — CBC WITH DIFFERENTIAL/PLATELET
Comment - H1-Com1: NORMAL
Comment - H1-Com2: NORMAL
HCT: 46.8 % (ref 40.0–52.0)
HGB: 15 g/dL (ref 13.0–18.0)
Lymphocytes: 3 %
MCH: 27.6 pg (ref 26.0–34.0)
MCHC: 32 g/dL (ref 32.0–36.0)
MCV: 86 fL (ref 80–100)
MONOS PCT: 3 %
PLATELETS: 137 10*3/uL — AB (ref 150–440)
RBC: 5.42 10*6/uL (ref 4.40–5.90)
RDW: 14.2 % (ref 11.5–14.5)
Segmented Neutrophils: 94 %
Variant Lymphocyte - H1-Rlymph: 0 %
WBC: 15.6 10*3/uL — ABNORMAL HIGH (ref 3.8–10.6)

## 2014-01-05 LAB — CK: CK, Total: 88 U/L (ref 39–308)

## 2014-01-06 LAB — BASIC METABOLIC PANEL
Anion Gap: 5 — ABNORMAL LOW (ref 7–16)
BUN: 80 mg/dL — AB (ref 7–18)
CREATININE: 2.1 mg/dL — AB (ref 0.60–1.30)
Calcium, Total: 7.8 mg/dL — ABNORMAL LOW (ref 8.5–10.1)
Chloride: 118 mmol/L — ABNORMAL HIGH (ref 98–107)
Co2: 28 mmol/L (ref 21–32)
EGFR (African American): 40 — ABNORMAL LOW
EGFR (Non-African Amer.): 33 — ABNORMAL LOW
Glucose: 200 mg/dL — ABNORMAL HIGH (ref 65–99)
Osmolality: 330 (ref 275–301)
Potassium: 4.5 mmol/L (ref 3.5–5.1)
SODIUM: 151 mmol/L — AB (ref 136–145)

## 2014-01-06 LAB — CBC WITH DIFFERENTIAL/PLATELET
HCT: 43.2 % (ref 40.0–52.0)
HGB: 13.9 g/dL (ref 13.0–18.0)
LYMPHS PCT: 5 %
MCH: 27.7 pg (ref 26.0–34.0)
MCHC: 32.2 g/dL (ref 32.0–36.0)
MCV: 86 fL (ref 80–100)
Monocytes: 6 %
Platelet: 136 10*3/uL — ABNORMAL LOW (ref 150–440)
RBC: 5.01 10*6/uL (ref 4.40–5.90)
RDW: 14.5 % (ref 11.5–14.5)
Segmented Neutrophils: 89 %
WBC: 12.2 10*3/uL — AB (ref 3.8–10.6)

## 2014-01-06 LAB — PHOSPHORUS: Phosphorus: 3.7 mg/dL (ref 2.5–4.9)

## 2014-01-06 LAB — URINE CULTURE

## 2014-01-06 LAB — MAGNESIUM: Magnesium: 2.4 mg/dL

## 2014-01-07 LAB — MAGNESIUM: Magnesium: 2.3 mg/dL

## 2014-01-07 LAB — CBC WITH DIFFERENTIAL/PLATELET
Basophil #: 0 10*3/uL (ref 0.0–0.1)
Basophil %: 0.2 %
EOS ABS: 0 10*3/uL (ref 0.0–0.7)
EOS PCT: 0.2 %
HCT: 40 % (ref 40.0–52.0)
HGB: 12.6 g/dL — ABNORMAL LOW (ref 13.0–18.0)
Lymphocyte #: 0.6 10*3/uL — ABNORMAL LOW (ref 1.0–3.6)
Lymphocyte %: 5.3 %
MCH: 27.7 pg (ref 26.0–34.0)
MCHC: 31.5 g/dL — ABNORMAL LOW (ref 32.0–36.0)
MCV: 88 fL (ref 80–100)
MONO ABS: 0.8 x10 3/mm (ref 0.2–1.0)
MONOS PCT: 6.7 %
NEUTROS ABS: 10.1 10*3/uL — AB (ref 1.4–6.5)
Neutrophil %: 87.6 %
PLATELETS: 145 10*3/uL — AB (ref 150–440)
RBC: 4.55 10*6/uL (ref 4.40–5.90)
RDW: 14.4 % (ref 11.5–14.5)
WBC: 11.6 10*3/uL — ABNORMAL HIGH (ref 3.8–10.6)

## 2014-01-07 LAB — BASIC METABOLIC PANEL
Anion Gap: 5 — ABNORMAL LOW (ref 7–16)
BUN: 78 mg/dL — AB (ref 7–18)
CHLORIDE: 116 mmol/L — AB (ref 98–107)
CREATININE: 1.83 mg/dL — AB (ref 0.60–1.30)
Calcium, Total: 7.7 mg/dL — ABNORMAL LOW (ref 8.5–10.1)
Co2: 29 mmol/L (ref 21–32)
EGFR (African American): 47 — ABNORMAL LOW
GFR CALC NON AF AMER: 39 — AB
Glucose: 183 mg/dL — ABNORMAL HIGH (ref 65–99)
OSMOLALITY: 326 (ref 275–301)
POTASSIUM: 4.2 mmol/L (ref 3.5–5.1)
Sodium: 150 mmol/L — ABNORMAL HIGH (ref 136–145)

## 2014-01-07 LAB — EXPECTORATED SPUTUM ASSESSMENT W REFEX TO RESP CULTURE

## 2014-01-07 LAB — PHOSPHORUS: Phosphorus: 3.4 mg/dL (ref 2.5–4.9)

## 2014-01-08 DIAGNOSIS — R000 Tachycardia, unspecified: Secondary | ICD-10-CM

## 2014-01-08 LAB — CBC WITH DIFFERENTIAL/PLATELET
Basophil #: 0 10*3/uL (ref 0.0–0.1)
Basophil %: 0.3 %
Eosinophil #: 0.1 10*3/uL (ref 0.0–0.7)
Eosinophil %: 0.8 %
HCT: 37.8 % — ABNORMAL LOW (ref 40.0–52.0)
HGB: 12.1 g/dL — ABNORMAL LOW (ref 13.0–18.0)
Lymphocyte #: 0.6 10*3/uL — ABNORMAL LOW (ref 1.0–3.6)
Lymphocyte %: 5.2 %
MCH: 27.6 pg (ref 26.0–34.0)
MCHC: 31.9 g/dL — ABNORMAL LOW (ref 32.0–36.0)
MCV: 87 fL (ref 80–100)
Monocyte #: 0.7 x10 3/mm (ref 0.2–1.0)
Monocyte %: 6.5 %
Neutrophil #: 9.6 10*3/uL — ABNORMAL HIGH (ref 1.4–6.5)
Neutrophil %: 87.2 %
Platelet: 157 10*3/uL (ref 150–440)
RBC: 4.36 10*6/uL — ABNORMAL LOW (ref 4.40–5.90)
RDW: 14.3 % (ref 11.5–14.5)
WBC: 11.1 10*3/uL — ABNORMAL HIGH (ref 3.8–10.6)

## 2014-01-08 LAB — BASIC METABOLIC PANEL
ANION GAP: 2 — AB (ref 7–16)
BUN: 69 mg/dL — AB (ref 7–18)
CALCIUM: 7.5 mg/dL — AB (ref 8.5–10.1)
CHLORIDE: 113 mmol/L — AB (ref 98–107)
CREATININE: 1.6 mg/dL — AB (ref 0.60–1.30)
Co2: 31 mmol/L (ref 21–32)
EGFR (Non-African Amer.): 45 — ABNORMAL LOW
GFR CALC AF AMER: 55 — AB
GLUCOSE: 205 mg/dL — AB (ref 65–99)
Osmolality: 317 (ref 275–301)
Potassium: 4.3 mmol/L (ref 3.5–5.1)
SODIUM: 146 mmol/L — AB (ref 136–145)

## 2014-01-09 LAB — CBC WITH DIFFERENTIAL/PLATELET
BASOS ABS: 0.1 10*3/uL (ref 0.0–0.1)
Basophil %: 0.5 %
Eosinophil #: 0.1 10*3/uL (ref 0.0–0.7)
Eosinophil %: 0.7 %
HCT: 40.6 % (ref 40.0–52.0)
HGB: 12.6 g/dL — ABNORMAL LOW (ref 13.0–18.0)
LYMPHS PCT: 5.1 %
Lymphocyte #: 0.7 10*3/uL — ABNORMAL LOW (ref 1.0–3.6)
MCH: 27.4 pg (ref 26.0–34.0)
MCHC: 31.1 g/dL — ABNORMAL LOW (ref 32.0–36.0)
MCV: 88 fL (ref 80–100)
MONO ABS: 0.9 x10 3/mm (ref 0.2–1.0)
Monocyte %: 7.1 %
Neutrophil #: 11.5 10*3/uL — ABNORMAL HIGH (ref 1.4–6.5)
Neutrophil %: 86.6 %
Platelet: 187 10*3/uL (ref 150–440)
RBC: 4.61 10*6/uL (ref 4.40–5.90)
RDW: 14.2 % (ref 11.5–14.5)
WBC: 13.3 10*3/uL — ABNORMAL HIGH (ref 3.8–10.6)

## 2014-01-09 LAB — BASIC METABOLIC PANEL
Anion Gap: 5 — ABNORMAL LOW (ref 7–16)
BUN: 67 mg/dL — AB (ref 7–18)
CO2: 29 mmol/L (ref 21–32)
CREATININE: 1.41 mg/dL — AB (ref 0.60–1.30)
Calcium, Total: 7.6 mg/dL — ABNORMAL LOW (ref 8.5–10.1)
Chloride: 108 mmol/L — ABNORMAL HIGH (ref 98–107)
EGFR (African American): >60
EGFR (Non-African Amer.): 52 — ABNORMAL LOW
Glucose: 221 mg/dL — ABNORMAL HIGH (ref 65–99)
OSMOLALITY: 309 (ref 275–301)
Potassium: 4.7 mmol/L (ref 3.5–5.1)
Sodium: 142 mmol/L (ref 136–145)

## 2014-01-10 LAB — BASIC METABOLIC PANEL
ANION GAP: 6 — AB (ref 7–16)
BUN: 58 mg/dL — AB (ref 7–18)
CREATININE: 1.43 mg/dL — AB (ref 0.60–1.30)
Calcium, Total: 8 mg/dL — ABNORMAL LOW (ref 8.5–10.1)
Chloride: 109 mmol/L — ABNORMAL HIGH (ref 98–107)
Co2: 29 mmol/L (ref 21–32)
EGFR (African American): >60
GFR CALC NON AF AMER: 51 — AB
Glucose: 221 mg/dL — ABNORMAL HIGH (ref 65–99)
Osmolality: 310 (ref 275–301)
Potassium: 4.3 mmol/L (ref 3.5–5.1)
Sodium: 144 mmol/L (ref 136–145)

## 2014-01-10 LAB — CULTURE, BLOOD (SINGLE)

## 2014-01-10 LAB — CBC WITH DIFFERENTIAL/PLATELET
Basophil #: 0.1 10*3/uL (ref 0.0–0.1)
Basophil %: 0.3 %
Eosinophil #: 0.2 10*3/uL (ref 0.0–0.7)
Eosinophil %: 1.1 %
HCT: 39.1 % — ABNORMAL LOW (ref 40.0–52.0)
HGB: 12.7 g/dL — ABNORMAL LOW (ref 13.0–18.0)
LYMPHS ABS: 0.9 10*3/uL — AB (ref 1.0–3.6)
LYMPHS PCT: 4.9 %
MCH: 28.1 pg (ref 26.0–34.0)
MCHC: 32.5 g/dL (ref 32.0–36.0)
MCV: 87 fL (ref 80–100)
MONOS PCT: 9.6 %
Monocyte #: 1.7 x10 3/mm — ABNORMAL HIGH (ref 0.2–1.0)
NEUTROS PCT: 84.1 %
Neutrophil #: 14.8 10*3/uL — ABNORMAL HIGH (ref 1.4–6.5)
Platelet: 232 10*3/uL (ref 150–440)
RBC: 4.51 10*6/uL (ref 4.40–5.90)
RDW: 14.1 % (ref 11.5–14.5)
WBC: 17.6 10*3/uL — ABNORMAL HIGH (ref 3.8–10.6)

## 2014-01-11 ENCOUNTER — Ambulatory Visit: Payer: Self-pay | Admitting: Internal Medicine

## 2014-01-11 DIAGNOSIS — I214 Non-ST elevation (NSTEMI) myocardial infarction: Secondary | ICD-10-CM

## 2014-01-11 LAB — BASIC METABOLIC PANEL
Anion Gap: 6 — ABNORMAL LOW (ref 7–16)
BUN: 56 mg/dL — AB (ref 7–18)
CALCIUM: 8 mg/dL — AB (ref 8.5–10.1)
CHLORIDE: 111 mmol/L — AB (ref 98–107)
CREATININE: 1.87 mg/dL — AB (ref 0.60–1.30)
Co2: 29 mmol/L (ref 21–32)
EGFR (African American): 46 — ABNORMAL LOW
GFR CALC NON AF AMER: 38 — AB
GLUCOSE: 226 mg/dL — AB (ref 65–99)
Osmolality: 313 (ref 275–301)
Potassium: 4 mmol/L (ref 3.5–5.1)
Sodium: 146 mmol/L — ABNORMAL HIGH (ref 136–145)

## 2014-01-11 LAB — URINALYSIS, COMPLETE
BACTERIA: NONE SEEN
Bilirubin,UR: NEGATIVE
Ketone: NEGATIVE
NITRITE: NEGATIVE
PH: 7 (ref 4.5–8.0)
Protein: 30
Specific Gravity: 1.007 (ref 1.003–1.030)
Squamous Epithelial: NONE SEEN
WBC UR: 125 /HPF (ref 0–5)

## 2014-01-12 LAB — BASIC METABOLIC PANEL
ANION GAP: 4 — AB (ref 7–16)
BUN: 43 mg/dL — AB (ref 7–18)
CO2: 31 mmol/L (ref 21–32)
Calcium, Total: 8.9 mg/dL (ref 8.5–10.1)
Chloride: 113 mmol/L — ABNORMAL HIGH (ref 98–107)
Creatinine: 1.49 mg/dL — ABNORMAL HIGH (ref 0.60–1.30)
EGFR (African American): 59 — ABNORMAL LOW
GFR CALC NON AF AMER: 49 — AB
Glucose: 169 mg/dL — ABNORMAL HIGH (ref 65–99)
OSMOLALITY: 309 (ref 275–301)
POTASSIUM: 4.2 mmol/L (ref 3.5–5.1)
Sodium: 148 mmol/L — ABNORMAL HIGH (ref 136–145)

## 2014-01-13 LAB — BASIC METABOLIC PANEL
Anion Gap: 4 — ABNORMAL LOW (ref 7–16)
BUN: 40 mg/dL — ABNORMAL HIGH (ref 7–18)
CALCIUM: 8.5 mg/dL (ref 8.5–10.1)
CHLORIDE: 113 mmol/L — AB (ref 98–107)
Co2: 33 mmol/L — ABNORMAL HIGH (ref 21–32)
Creatinine: 1.45 mg/dL — ABNORMAL HIGH (ref 0.60–1.30)
EGFR (African American): >60
EGFR (Non-African Amer.): 51 — ABNORMAL LOW
GLUCOSE: 78 mg/dL (ref 65–99)
Osmolality: 307 (ref 275–301)
Potassium: 3.8 mmol/L (ref 3.5–5.1)
Sodium: 150 mmol/L — ABNORMAL HIGH (ref 136–145)

## 2014-01-14 LAB — BASIC METABOLIC PANEL
Anion Gap: 7 (ref 7–16)
BUN: 29 mg/dL — ABNORMAL HIGH (ref 7–18)
Calcium, Total: 8.2 mg/dL — ABNORMAL LOW (ref 8.5–10.1)
Chloride: 107 mmol/L (ref 98–107)
Co2: 31 mmol/L (ref 21–32)
Creatinine: 1.36 mg/dL — ABNORMAL HIGH (ref 0.60–1.30)
EGFR (African American): >60
EGFR (Non-African Amer.): 54 — ABNORMAL LOW
Glucose: 67 mg/dL (ref 65–99)
Osmolality: 293 (ref 275–301)
Potassium: 3.8 mmol/L (ref 3.5–5.1)
Sodium: 145 mmol/L (ref 136–145)

## 2014-02-10 ENCOUNTER — Encounter (INDEPENDENT_AMBULATORY_CARE_PROVIDER_SITE_OTHER): Payer: Self-pay

## 2014-02-10 ENCOUNTER — Encounter: Payer: Self-pay | Admitting: Cardiovascular Disease

## 2014-02-10 ENCOUNTER — Ambulatory Visit (INDEPENDENT_AMBULATORY_CARE_PROVIDER_SITE_OTHER): Payer: Medicare Other | Admitting: Cardiovascular Disease

## 2014-02-10 VITALS — BP 98/58 | HR 76 | Ht 70.0 in | Wt 183.0 lb

## 2014-02-10 DIAGNOSIS — I2510 Atherosclerotic heart disease of native coronary artery without angina pectoris: Secondary | ICD-10-CM

## 2014-02-10 DIAGNOSIS — I10 Essential (primary) hypertension: Secondary | ICD-10-CM

## 2014-02-10 DIAGNOSIS — E785 Hyperlipidemia, unspecified: Secondary | ICD-10-CM | POA: Insufficient documentation

## 2014-02-10 NOTE — Assessment & Plan Note (Signed)
Blood pressure is borderline low on small dose metoprolol.

## 2014-02-10 NOTE — Progress Notes (Signed)
HPI  This is a 73 year old male who is here today for a follow-up visit after recent prolonged hospitalization at [facility]. He presented in November with severe septic shock and multiorgan failure. He was intubated and was on the vent for a long time. He was found to have non-ST elevation myocardial infarction with a peak troponin of 25. Echocardiogram showed low normal LV systolic function with inferior and posterior wall hypokinesis. He also had pneumothorax that required chest placement. He was treated medically and gradually improved after a prolonged hospitalization. He was discharged to rehabilitation. He has been making significant progress there with physical therapy. 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 He denies any chest pain or shortness of breath.  No Known Allergies   No current outpatient prescriptions on file prior to visit.   No current facility-administered medications on file prior to visit.     Past Medical History  Diagnosis Date  . MI (myocardial infarction)   . Muscle weakness   . Dysphagia   . PVD (peripheral vascular disease)   . Anxiety disorder   . Diabetes mellitus without complication   . Gastro-esophageal reflux   . Acute kidney failure   . Rhabdomyolysis   . Hematuria   . Urinary retention   . Hypernatremia   . Coronary artery disease     Non-ST elevation myocardial infarction in November 2015 in the setting of septic shock and multiorgan failure. Peak troponin was 25. Echo showed normal LV systolic function with inferior and posterior hypokinesis.  . Hyperlipidemia   . Hypertension      Past Surgical History  Procedure Laterality Date  . Leg amputation       Family History  Problem Relation Age of Onset  . Heart Problems Father   . Heart disease Father      History   Social History  . Marital Status: Single    Spouse Name: N/A    Number of Children: N/A  . Years of Education: N/A    Occupational History  . Not on file.   Social History Main Topics  . Smoking status: Former Smoker    Types: Cigarettes  . Smokeless tobacco: Not on file  . Alcohol Use: No  . Drug Use: No  . Sexual Activity: Not on file   Other Topics Concern  . Not on file   Social History Narrative  . No narrative on file     PHYSICAL EXAM   BP 98/58 mmHg  Pulse 76  Ht 5\' 10"  (1.778 m)  Wt 183 lb (83.008 kg)  BMI 26.26 kg/m2 Constitutional: He is oriented to person, place, and time. He appears well-developed and well-nourished. No distress.  HENT: No nasal discharge.  Head: Normocephalic and atraumatic.  Eyes: Pupils are equal and round.  No discharge. Neck: Normal range of motion. Neck supple. No JVD present. No thyromegaly present.  Cardiovascular: Normal rate, regular rhythm, normal heart sounds. Exam reveals no gallop and no friction rub. No murmur heard.  Pulmonary/Chest: Effort normal and breath sounds normal. No stridor. No respiratory distress. He has no wheezes. He has no rales. He exhibits no tenderness.  Abdominal: Soft. Bowel sounds are normal. He exhibits no distension. There is no tenderness. There is no rebound and no guarding.  Musculoskeletal: Normal range of motion. He exhibits no edema and no tenderness.  Neurological: He is alert and oriented to person, place, and time. Coordination normal.  Skin: Skin is  warm and dry. No rash noted. He is not diaphoretic. No erythema. No pallor.  Psychiatric: He has a normal mood and affect. His behavior is normal. Judgment and thought content normal.       XBM:WUXLKEKG:Sinus  Rhythm  WITHIN NORMAL LIMITS   ASSESSMENT AND PLAN

## 2014-02-10 NOTE — Patient Instructions (Signed)
Continue same medications.  Follow up in 3 months.  

## 2014-02-10 NOTE — Assessment & Plan Note (Signed)
Continue treatment with high dose atorvastatin. He will require a fasting lipid and liver profile in the near future.

## 2014-02-10 NOTE — Assessment & Plan Note (Signed)
Surprisingly, he is actually doing very well and has made significant progress since his recent hospitalization. His mental status is back to normal. He denies any anginal symptoms. Due to that, I think is very reasonable to continue medical therapy especially that his ejection fraction was close to normal. He is already on optimal medications. I did not make any changes. I will have him follow-up in 3 months.

## 2014-02-11 ENCOUNTER — Ambulatory Visit: Payer: Self-pay | Admitting: Internal Medicine

## 2014-06-04 NOTE — H&P (Signed)
PATIENT NAME:  Spencer Edwards, Spencer Edwards MR#:  [id number] DATE OF BIRTH:  08-24-1940  DATE OF ADMISSION:  07/15/2013  ADMITTING DIAGNOSES:  1. Right-sided spontaneous pneumothorax.  2. Urinary retention with gross hematuria.  3. Peripheral vascular disease.  4. Stage I sacral decubitus.   HISTORY OF PRESENT ILLNESS: Mr. Spencer Edwards is a 74 year old white male who is status post right below-knee amputation several years ago. Since that time, he has had significant limitations in his overall functional status. The patient has been having difficulties passing his urine over the last several days. In addition, about 4 days ago, he swallowed a Dulcolax pill which he says went down the wrong pipe. He then began a severe coughing episode, and at the time, noticed blood around his urethral meatus and started developing shortness of breath. He found it difficult, over the last several days, to pass his urine, and he came into the Emergency Room with the above-named complaints. While in the Emergency Room, he had a CT scan of his abdomen done. That revealed a very large right-sided pneumothorax. He had no evidence of kidney stones, and there was some bladder thickening, but no obvious tumor. I was consulted to place a chest tube. This was accomplished in the Emergency Room without difficulty. A postoperative chest x-ray showed the lung to be fully expanded.   PAST MEDICAL HISTORY: Significant for peripheral vascular disease, status post amputation. He has also had a laparotomy for small bowel obstruction. He has a history of  insulin-dependent diabetes mellitus. He sees Dr. Aram BeechamJeffrey Edwards for his routine medical care.   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 He stopped smoking several years ago. He does not drink alcohol. He currently lives alone and has a home health agency that provides him with some care.   FAMILY HISTORY: There is no family history of any lung disease. There is no  family history of any lung cancer.   REVIEW OF SYSTEMS: As per history of present illness, and all other review of systems were asked and were negative.   PHYSICAL EXAMINATION:  GENERAL: Revealed a somewhat disheveled elderly man in no acute distress. He was able to speak in complete sentences.  GENITOURINARY: He had a Foley catheter in place that was draining bloody urine. There were some blood clots in the urine stream.  LUNGS: Showed diminished breath sounds on the right. After chest tube insertion, his right lung sounded normal.  HEART: Regular and tachycardic. There were no murmurs.  ABDOMEN: Soft, nontender, nondistended. He had normal bowel sounds. There were no palpable masses. There was no hepatosplenomegaly.  EXTREMITIES: Revealed a right below-knee amputation. The left foot had no pulses. It was, however, warm.  SKIN: He did have a stage I sacral decubitus ulcer.   ASSESSMENT AND PLAN: This patient will be admitted to the hospital with a right-sided pneumothorax. We will consult Dr. Aram BeechamJeffrey Edwards for management of his diabetes and other medical problems. Will also consult Dr. Lonna Edwards. I have discussed his care with Dr. Lonna Edwards, and I explained to him his current situation, and Dr. Lonna Edwards will see the patient. His chest tube will be placed to minus 20 cm of water suction. Will repeat his chest x-ray in the morning.    ____________________________ Sheppard Plumberimothy E. Thelma Bargeaks, MD teo:lb D: 07/15/2013 13:52:24 ET Edwards: 07/15/2013 14:00:13 ET JOB#: [id number]  cc: Spencer Pacasimothy E. Thelma Bargeaks, MD, <Dictator> Spencer DecemberIMOTHY E Detrick Dani MD ELECTRONICALLY SIGNED 07/17/2013 19:56

## 2014-06-04 NOTE — Consult Note (Signed)
PATIENT NAME:  Spencer BrunnerPPERSON, Sharon T MR#:  [id number] DATE OF BIRTH:  1940-04-09  DATE OF CONSULTATION:  12/31/2013  CONSULTING PHYSICIAN:  Caralyn Guileichard D. Edwyna ShellHart, DO  HISTORY OF PRESENT ILLNESS: An intubated patient, came in with acute sepsis, unknown origin. A 74 year old male with a history of diabetes, recent right pneumothorax status post tube thoracotomy in June 2015 and other multiple medical problems. Is found at home and he was sent immediately to the emergency room, blood pressure 247/190, oxygenation satisfactory. Given labetalol. Blood pressure plummeted to the 60s. He was intubated when his mental status worsened. He had an elevated troponin to 29.0. There is no intervention cardiac-wise at this point.   PAST MEDICAL HISTORY: He has had a history of sacral decubitus, hypertension, diabetes, peripheral vascular disease, right below-knee amputation, tobacco abuse, chronic obstructive pulmonary disease.   PAST SURGICAL HISTORY: Small bowel obstruction, right below-knee amputation.  SOCIAL HISTORY: Quit smoking and drinking a long time ago.   FAMILY HISTORY: Diabetes.   MEDICATIONS: He is on Lantus 24 subcutaneous, Colace, aspirin, Advair.   PHYSICAL EXAMINATION:   GENERAL: At the present time, cannot talk to him to do a decent history, so I have to take it from his internist. He is a well-developed, well nourished, obese, white male. Moderate to severe respiratory distress on a respirator, unable to communicate.   LUNGS: Diminished sounds bilaterally.   ABDOMEN: Soft.   GENITOURINARY: Foley catheter is in, minimal dark urine, more light pink urine and probably secondary to his bacteria.   LABORATORY DATA: He had 2+ bacteria on admission, red blood cells 2808 and a white cell count of 2128. I am assuming that he could have had a diabetic-induced urinary tract infection with hematuria now secondary to that.   LABORATORY DATA: I have personally reviewed his CAT scan. There are no urinary  tract anomalies, tumors, masses or growths. 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   IMPRESSION AND PLAN: My urology impression at this point is a urinary tract infection with gross hematuria. Further work-up to include cystoscopy in the future. We can do that in our office. I am assuming he has a urinary tract infection. I would continue to treat him with antibiotics. This should clear. The Foley catheter is in good place on CAT scan. There are no masses in the bladder. The bladder is compress, cannot really see if there is anything major there that needs to be reviewed, but with a smoking history we would certainly do a cystoscopy at this point. No other invasive studies need to be done at this time by urology.   Thanks very much and let us know if you need further help.     ____________________________ Caralyn Guileichard D. Edwyna ShellHart, DO rdh:TT D: 12/31/2013 14:00:46 ET T: 12/31/2013 14:24:50 ET JOB#: [id number]  cc: Caralyn Guileichard D. Edwyna ShellHart, DO, <Dictator> Clete D HART DO ELECTRONICALLY SIGNED 01/26/2014 13:11

## 2014-06-04 NOTE — Discharge Summary (Signed)
PATIENT NAME:  Spencer Edwards, Diangelo T MR#:  [id number] DATE OF BIRTH:  04/10/1940  DATE OF ADMISSION:  12/30/2013 DATE OF DISCHARGE:  01/14/2014  DISPOSITION: To [facility].  DISCHARGE DIAGNOSES: 1.  Acute renal failure, resolved.  2.  Sepsis with multiorgan failure, resolved. 3.  Severe septic shock, resolved.  4.  Acute non-ST-elevation myocardial infarction. 5.  Rhabdomyolysis with hematuria.  6.  Acute respiratory failure status post intubation and extubation.  7.  Type 2 diabetes mellitus.  8.  Thrombocytopenia from sepsis, resolved. 9.  Metabolic encephalopathy, resolved.  10.  Peripheral vascular disease. 11.  Hematuria. 12.  Urinary retention. 13.  Hypernatremia.  CODE STATUS: DNR.   CONSULTATIONS: Seen by Dr. Thelma Bargeaks from thoracic surgery, Dr. Meredeth IdeFleming from pulmonology, Dr. Harvie JuniorPhifer from palliative care, Dr. Mosetta PigeonHarmeet Singh from nephrology, Dr. Mariah MillingGollan from cardiology, Dr. Trey Paulaichard Hart.   DISCHARGE MEDICATIONS: 1.  Flomax 0.4 mg p.o. daily. 2.  Insulin Lantus. 3.  Levemir 25 units every 24 hours. 3.  Dulcolax suppository as needed, 10 mg. 4.  Atorvastatin 80 mg p.o. at bedtime. 5.  Metoprolol 12.5 mg q. 12 hours.  6.  Seroquel 25 mg p.o. b.i.d.  7.  Xanax 0.25 mg every 8 hours.  8.  Aspirin 81 mg daily.  9.  Plavix 75 mg p.o. daily.  10.  Albuterol tiotropium nebulizer every 6 hours as needed for wheezing.  11.  Famotidine 20 mg p.o. daily.   DISCHARGE DIET: Pureed diet with nectar thick liquids and aspiration precautions. Meds in puree.   HOSPITAL COURSE:  1.  This is a 74 year old male patient admitted on November 19th because of poor responsiveness. The patient's blood pressure was 247/190 when he came, and the patient was given a dose of labetalol in the ER and then blood pressure dropped to 60s. The patient was admitted for severe metabolic acidosis with acute renal failure and severe septic shock with multiorgan failure. Please refer to Dr. Rob HickmanGouru's interim discharge  summary for full details. The patient had blood cultures and urine cultures that revealed Proteus. The patient was given Zosyn, Levaquin, and vancomycin. After some time, the patient's cultures were sensitive to Levaquin so Zosyn was discontinued and we continued Levaquin for 10 days. The patient's septic shock improved with antibiotcs,fluids. The patient was seen by Dr. Sampson GoonFitzgerald in consultation.. The patient initially felt better, but continued to have fever for about 2 days, on December 1st and 2nd. The patient's cultures were repeated. They were negative. The patient was given Keflex p.o., and he finished the course of Keflex for 10 days, on December 3rd. The patient did not require any further antibiotics.  2.  Severe metabolic acidosis with metabolic encephalopathy due to septic shock, acute renal failure. The patient initially received bicarb drip then fluids. His mental status improved.  3.  The patient had some anxiety on and off so we have added Xanax and Seroquel. Dose needs to be titrated according to his clinical anxiety levels and sedation levels. 4.  Acute renal failure and hypernatremia. Seen by Dr. Mady HaagensenMunsoor Lateef. The patient received D5 water for hypernatremia. Right now his sodium is improved. Sodium is normal, 145. It improved from 150. The patient's renal failure also improved with fluids. Most recent renal function is 1.36.  5.  Hematuria with urine retention. Had a Foley catheter placed with post residual of 900. The patient seen by Dr. Edwyna ShellHart. He said the patient needs cystoscopy as an outpatient. His hematuria resolved. His hematuria is thought to be  secondary to UTI. We added Flomax for urinary retention and he will see Dr. Edwyna Shell in office for followup of his hematuria and also possible cystoscopy.  6.  Non-ST-elevation MI. The patient was seen by cardiology. He is on aspirin, beta blockers, statins and Plavix has been added today. The patient has elevated troponins of 29 so cardiology  has seen the patient and the patient did have inferolateral T-wave changes, and the patient has no chest pain or dyspnea. The patient advised medical management because of his renal failure. The patient will be seen by cardiology as an outpatient for possible cardiac cath as an outpatient.  7.  The patient had a hydropneumothorax and seen by Dr. Thelma Barge. He had a chest tube placed initially; that was placed on November 28th. The patient also had thoracocentesis with 700 mL of straw-colored fluid removed after placing the chest tube. The patient's chest tube was successfully removed on the 2nd. The patient had no significant effusion on repeat x-rays. The patient right now is not hypoxic. His O2 sats are 93% on room air and sometimes it is 96%. He does not have any trouble breathing. Repeat chest x-ray is done. It showed improvement in his pneumothorax. The patient has only minimal pneumothorax and Dr. Thelma Barge recommended no further intervention and chest tube was removed. 8.  Diabetes mellitus, type 2. The patient is on Levemir and sliding scale with coverage. Sugars have been fine, around 120s. He can continue Levemir and continue on dysphagia diet with nectar thick liquids.   The patient is going to [facility]. The patient's cousin, Britta Mccreedy, is healthcare power of attorney at this time.   PHYSICAL EXAMINATION: DISCHARGE VITAL SIGNS: Temperature 98.8, heart rate 87, blood pressure 142/75, sats 94 to 95% on room air.  GENERAL: He is alert, awake and oriented, an elderly male, not in distress. HEAD: Atraumatic, normocephalic. EYES: Pupils equally reacting to light. Extraocular movements intact.  EARS: No tympanic membrane congestion, no turbinate hypertrophy.  EXTREMITIES: No extremity edema. Has below knee amputation on the right side.   The patient's hospital course is very complicated. He had sepsis and vent-dependent respiratory failure complicated by pneumothorax with chest tube placement which is  removed now and the patient had multiorgan failure with sepsis, treated and resolved, non-ST-elevation MI. The patient also had hypernatremia and renal failure. All of this is corrected and the patient is right now stable for transfer to skilled nursing.   TIME SPENT: More than 35 minutes.   ____________________________ Katha Hamming, MD sk:sb D: 01/14/2014 09:47:57 ET T: 01/14/2014 10:21:59 ET JOB#: [id number]  cc: Katha Hamming, MD, <Dictator> Muhammad A. Kirke Corin, MD Antonieta Iba, MD Caralyn Guile. Edwyna Shell, DO Munsoor Lizabeth Leyden, MD Stann Mainland. Sampson Goon, MD Sheppard Plumber. Thelma Barge, MD Katha Hamming MD ELECTRONICALLY SIGNED 01/25/2014 23:37

## 2014-06-04 NOTE — Consult Note (Signed)
PATIENT NAME:  Spencer Edwards, Collie T MR#:  [id number] DATE OF BIRTH:  Nov 07, 1940  DATE OF CONSULTATION:  12/31/2013  REQUESTING PHYSICIAN:  Katharina Caperima Vaickute, MD CONSULTING PHYSICIAN:  Dolorez Jeffrey Lizabeth LeydenN. Johnathon Mittal, MD   REASON FOR CONSULTATION: Acute renal failure in the setting of septic shock.   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  History is obtained through chart review.  Apparently, he was visited by [employer] yesterday and found to be poorly responsive.  He was subsequently brought to the Emergency Department.  It was felt that he could not protect his airway, therefore, he underwent intubation. Upon initial evaluation, he was found to have severe metabolic derangements including acute renal failure, metabolic acidosis, rhabdomyolysis.  He was also found to have an acute myocardial infarction with a troponin as high as 29.  He also now appears to have gram-negative sepsis as initial blood cultures are showing gram-negative rods. He also had significant hematuria as well as pyuria.  He was started on aggressive IV fluid hydration.  Creatinine has come down to 4.15. Serum bicarbonate is up a bit to 20. He is on a bicarbonate drip.   PAST MEDICAL HISTORY:  1.  Diabetes mellitus type 2.  2.  History of spontaneous right pneumothorax.  3.  History of urinary retention.  4.  History of sacral decubitus.  5.  Hypertension.  6.  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  7.  History of tobacco abuse.  8.  Chronic obstructive pulmonary disease.  9.  History of small bowel obstruction status post  surgical repair.   ALLERGIES: No known drug allergies.   CURRENT INPATIENT MEDICATIONS: Include:  1.  Fentanyl drip.  2.  Norepinephrine drip.  3.  Normal saline 0.9 at 100 mL per hour.  4.  Sodium bicarbonate 150 mEq at 100 mL per hours.  5.  Insulin drip.  6.  Propofol drip.  7.  Aspirin 300 mg rectally daily.  8.  Docusate Senna 1 tablet p.o. b.i.d.  9.  Fluticasone 2 puffs inhaled every 12 hours.  10. Solu-Medrol 60 mg IV every 6 hours.  11. Protonix 40 mg IV every 6 a.m.  12. Zosyn 3.375 grams IV every 12 hours.  13. Vancomycin 1000 mg IV every 48 hours.   SOCIAL HISTORY: Unable to obtain directly from the patient; however according to the prior medical records, he does have history of tobacco abuse. Also remote history of alcohol abuse.   FAMILY HISTORY: Unable to obtain from the patient directly; however, per prior records it appears that the history is significant for diabetes mellitus.   REVIEW OF SYSTEMS:  Unable to obtain from the patient as he is currently intubated and sedated.   PHYSICAL EXAMINATION:  VITAL SIGNS: Temperature 36.3 Celsius, pulse 92, respirations 24, blood pressure 118/78.  GENERAL: Reveals a critically ill-appearing male.  HEENT: Normocephalic, atraumatic. No spontaneous extraocular movements are noted. Pupils were 3 mm and sluggish to react. Endotracheal tube was noted to be in place.  NECK: Supple.  LUNGS: Show bilateral rhonchi and breath sounds were vent-assisted.  CARDIOVASCULAR: S1, S2 regular rate and rhythm. No murmurs or rubs appreciated.  ABDOMEN: Soft, nontender, nondistended. Bowel sounds  positive. No rebound or guarding. No gross organomegaly appreciated.  EXTREMITIES: No clubbing or cyanosis noted in the left lower extremity, right lower extremity amputation noted.  NEUROLOGIC: The patient is currently intubated and sedated, not following commands.  GENITOURINARY: No suprapubic tenderness is noted at this time. Foley catheter noted to be  in place.  SKIN: Warm and dry. No rashes noted.  MUSCULOSKELETAL: Right lower extremity amputation noted.  PSYCHIATRIC: Unable to assess at this time.   LABORATORY DATA: Sodium 138, potassium 5.0, chloride 104, CO2 of 20, BUN 72, creatinine 4.15, glucose 386, total protein 5.7, albumin 2.2, total bilirubin 2.9, alkaline phosphatase 102, AST 277, ALT 88. CK is 4554. TSH 2.13. Urine drug screen negative.  CBC shows WBC 24.2, hemoglobin 13, hematocrit 41, platelets of 80,000.  INR 1.3.  Initial blood culture shows gram-negative rods.  Urinalysis shows 2808 RBCs per high-power field and 2128 WBCs per high-power field. ABG shows pH of 7.2, pCO2 of 39, pO2 of 131 and FiO2 of 35%.   IMPRESSION: This is a 74 year old Caucasian male with past medical history of diabetes mellitus, type 2, history of spontaneous right pneumothorax, history of urinary retention, history of sacral decubitus ulcer, hypertension, peripheral vascular disease, a right below-the-knee amputation, history of tobacco abuse, chronic obstructive pulmonary disease, history of small bowel obstruction status post surgical repair, who presented to [facility] in a poorly responsive state.   ASSESSMENT AND PLAN:  1.  Acute renal failure secondary to acute tubular necrosis. The patient has sepsis which is the likely reason for his acute renal failure with acute tubular necrosis.  His baseline creatinine is 1.1. Presenting creatinine was 5.06 and is now down to 4.15. Continue IV fluid support for now. The patient had a CT scan of the abdomen and pelvis which was negative for obstruction. No urgent indication for dialysis at the patient is producing urine. Would certainly avoid any further nephrotoxins, however.  2.  Metabolic acidosis. Most likely secondary to sepsis as well as acute renal failure. Serum bicarbonate is up to 20.  We will discontinue 0.9 normal saline and increase the sodium bicarbonate rate to 150 mL per hour.   3.  Gram-negative sepsis.  Gram-negative rods are currently growing. Agree with broad-spectrum antibiotics including Zosyn, which should cover most relevant gram-negative rods.  4.  Rhabdomyolysis. CK does appear to be trending down. We will continue sodium bicarbonate drip at this time.  5.  Acute respiratory failure. The patient is currently maintained on the ventilator. Would continue vent support at this time.   I would like to thank Dr. Winona Legato for this kind referral.  Further plan as the patient progresses.      ____________________________ Lennox Pippins, MD mnl:DT D: 12/31/2013 09:48:10 ET T: 12/31/2013 10:27:33 ET JOB#: [id number]  cc: Lennox Pippins, MD, <Dictator> Lennox Pippins MD ELECTRONICALLY SIGNED 01/10/2014 15:46

## 2014-06-04 NOTE — Op Note (Signed)
PATIENT NAME:  Spencer Edwards, Spencer Edwards DATE OF BIRTH:  1940/04/03  DATE OF PROCEDURE:  01/08/2014  PREOPERATIVE DIAGNOSIS: Right hydropneumothorax.   POSTOPERATIVE DIAGNOSIS:  Right hydropneumothorax.   PROCEDURE PERFORMED: Right tube thoracostomy.   SURGEON: Elye Harmsen A. Violet Seabury, MD.   ANESTHESIA: IV sedation and 1% lidocaine with epinephrine.   COMPLICATIONS: None.   SPECIMENS: None.   FINDINGS: Are 700 of straw-colored fluid immediately upon chest tube placement.   INDICATION FOR PROCEDURE: [name] is a pleasant, 74 year old who was admitted for elevated cardiac enzymes and hypertension. He has a history of pneumothorax. He began having an enlarging pneumothorax with a fluid component. I was consulted for tube thoracostomy and I thought the patient would benefit from tube thoracostomy.   DETAILS OF PROCEDURE: Informed consent was obtained. Mr. [name] was laid supine and his right chest was prepped and draped  in the standard surgical fashion. A timeout was then performed correctly identifying the patient name, operative site and procedure to be performed. His right chest was then anesthetized with 1% lidocaine with epinephrine. An incision was made at the nipple line in the anterior axillary line. It was deepened down to the ribs and using a Kelly clamp, I hopped over the ribs and into the pleura. There was a large amount of air and some fluid release. I then placed a 24 French tube through the thoracostomy with a large amount of straw-colored fluid. This was hooked up to suction. The tube was sutured in place. A sterile dressing was placed over the tube. The tube was placed to suction. There was some bubbling initially but then no obvious air leak. There were no immediate complications. Needle, sponge and instrument counts were correct at the end of the procedure.     ____________________________ Si Raiderhristopher A. Khadeejah Castner, MD cal:TT D: 01/09/2014 12:30:50  ET T: 01/09/2014 19:31:27 ET JOB#: [id number]  cc: Cristal Deerhristopher A. Ramil Edgington, MD, <Dictator> Jarvis NewcomerHRISTOPHER A Lucrezia Dehne MD ELECTRONICALLY SIGNED 01/17/2014 20:11

## 2014-06-04 NOTE — Op Note (Signed)
PATIENT NAME:  Spencer Edwards, [name] MR#:  [id number] DATE OF BIRTH:  [birth year]  DATE OF PROCEDURE:  07/15/2013  PREOPERATIVE DIAGNOSIS: Right pneumothorax.   POSTOPERATIVE DIAGNOSIS:  Right pneumonia.    OPERATION PERFORMED: Insertion of right-sided chest tube.   SURGEON:  Timothy E. Thelma Bargeaks, MD  INDICATIONS FOR PROCEDURE: [name] is a 74 year old gentleman who presented with a right-sided pneumothorax and shortness of breath. He was apprised of the indications and risks of chest tube insertion. The patient gave his informed consent.   DESCRIPTION OF PROCEDURE: The patient was positioned in the Emergency Room bed with his right arm elevated above his head. The right axilla was prepped and draped in the usual sterile fashion. A 14-French Thal-Quick chest tube was inserted. The skin was anesthetized with 1% lidocaine, as was the deeper tissues. The needle was advanced until air was obtained from the pleural space and then a wire was positioned into the pleural space through the needle. Once this was complete, we then placed several peel-away sheaths and dilators over the wire and ultimately the 14-French chest tube was inserted over the wire into the pleural space. The wire was withdrawn and the tube was secured. It was then connected to that Pleur-evac and sterile dressings were applied. The patient had a chest x-ray afterward which showed the lung to be fully inflated. The patient tolerated the procedure well.   ____________________________ Sheppard Plumberimothy E. Thelma Bargeaks, MD teo:cs D: 08/02/2013 15:52:33 ET T: 08/02/2013 19:43:40 ET JOB#: [id number]  cc: Marcial Pacasimothy E. Thelma Bargeaks, MD, <Dictator> Jasmine DecemberIMOTHY E OAKS MD ELECTRONICALLY SIGNED 08/13/2013 11:56

## 2014-06-04 NOTE — Consult Note (Signed)
PATIENT NAME:  Spencer Edwards, Spencer Edwards MR#:  [id number] DATE OF BIRTH:  1941/01/04  DATE OF CONSULTATION:  07/15/2013  REFERRING PHYSICIAN:  Timothy E. Thelma Barge, MD CONSULTING PHYSICIAN:  Shaune Pollack, MD  PRIMARY CARE PHYSICIAN:  Per Dr. Duane Lope. Judithann Sheen, MD, not his patient. Nonlocal.  REASON FOR CONSULTATION: Medical management.   REVIEW OF HISTORY: The patient is a 74 year old Caucasian male with a history of hypertension, COPD, diabetes, who was admitted for pneumothorax by Dr. Thelma Barge. Actually, the patient had trouble with urination this morning and then came to the ED for further evaluation. The patient noticed he had a little bit of bloody urine before coming to the ED. Since the patient could not urinate, Dr. Fanny Bien, ED physician, put in a Foley catheter, which drained a lot of bloody urine. Dr. Fanny Bien ordered a CAT scan of abdomen, which showed right-sided pneumothorax, so Dr. Thelma Barge evaluated the patient and put in a chest tube this morning. Dr. Thelma Barge admitted the patient and requested medical consult for medical management. The patient mentioned that he developed cough and shortness of breath 4 to 5 days ago after taking medication, but otherwise, the patient denies any other symptoms.   PAST MEDICAL HISTORY:  1. Hypertension.  2. Diabetes. 3. COPD. 4. PVD. 5. Gangrene of right lower extremity, status post BKA.   PAST SURGICAL HISTORY:  1. Right-sided BKA. 2. Small bowel obstruction, status post surgery.   SOCIAL HISTORY: Quit smoking and alcohol drinking a long time ago. Denies any drug abuse.   FAMILY HISTORY: Mother had diabetes. Father had a heart attack. No hypertension or cancer in his family.   ALLERGIES: None.   HOME MEDICATIONS:  1. Multivitamin 1 tablet p.o. daily.  2. Lantus 24 units subcutaneous once a day.  3. Colace 100 mg 1 cap b.i.d. p.r.n. 4. Aspirin 81 mg p.o. daily.  5. Advair 250 mcg p.o. b.i.d.   REVIEW OF SYSTEMS:  CONSTITUTIONAL: The patient denies any fever or  chills. No headache or dizziness or weakness.  EYES: No double vision or blurry vision.  ENT: No postnasal drip, slurred speech or dysphagia.  CARDIOVASCULAR: No chest pain, palpitations. No orthopnea or nocturnal dyspnea. No leg edema.  PULMONARY: Positive for cough, sputum, shortness of breath, but no hemoptysis. No wheezing.  GASTROINTESTINAL: No abdominal pain, nausea, vomiting, diarrhea. No melena or bloody stool.  GENITOURINARY: No dysuria, but has hematuria and urine retention.  NEUROLOGY: No syncope, loss of consciousness or seizure.  HEMATOLOGY: No easy bruising or bleeding.  ENDOCRINOLOGY: No polyuria, polydipsia, heat or cold intolerance.  SKIN: No rash or jaundice.   PHYSICAL EXAMINATION:  VITAL SIGNS: Temperature 98, blood pressure 171/70, pulse 130, respirations 20, O2 saturation 95% on oxygen by nasal cannula.  GENERAL: The patient is alert, awake, oriented, in no acute distress.  HEENT: Pupils round and equally reactive to light and accommodation. Moist oral mucosa. Clear oropharynx. NECK: Supple. No JVD or carotid bruit. No lymphadenopathy. No thyromegaly.  CARDIOVASCULAR: S1 and S2. Regular rate, tachycardia. No murmurs or gallops.  PULMONARY: Bilateral air entry. No wheezing or rales. Right side: Weak breath sounds. No crackles. No use of accessory muscles to breathe.  ABDOMEN: Soft. No distention. No tenderness. No organomegaly. Bowel sounds present.  EXTREMITIES: No edema, clubbing or cyanosis. Right-sided BKA. Left side: No calf tenderness. Left pedal pulse is present.  SKIN: No rash or jaundice.  CHEST: Chest tube on the right side in situ with bloody drainage, about 50 mL.  GENITOURINARY: Foley catheter  drained a lot of bloody urine.  NEUROLOGICAL: A and O x3. No focal deficit. Power 5 out of 5. Sensation intact.   LABORATORY DATA:  The last chest x-ray after chest tube showed interval insertion of a small bore right chest tube with near-complete re-expansion of  right lung.  Before chest tube, chest x-ray showed a large right pneumothorax.  Glucose 275, BUN 21, creatinine 1.10, electrolytes were normal.  WBC 15.4, hemoglobin 17.8, platelets 238.  INR 1.0.  CAT scan of abdomen and pelvis showed greater than 50% right hydropneumothorax, cholelithiasis. No evidence of renal calculus. Also, there is bladder wall thickening and stranding about bladder wall.  Urinalysis showed RBC 12,545, WBC 603, nitrite positive.   IMPRESSIONS:  1. Right pneumothorax.  2. Urinary retention.  3. Hematuria.  4. Systemic inflammatory response syndrome.  5. Urinary tract infection.  6. Hematuria.  7. Dehydration.  8. Hypertension.  9. Diabetes.  10. Chronic obstructive pulmonary disease.  11. History of peripheral vascular disease.   RECOMMENDATIONS:  1. Agree with current treatment.  2. Continue O2 by nasal cannula.  3. Will add Advair and nebulizer treatment.  4. Hold aspirin due to hematuria.  5. Will continue Foley catheter.  6. Follow up urology consult.  7. For SIRS and UTI, we will start Levaquin. Follow up CBC, urine culture.  8. For mild dehydration, will give normal saline and follow up BMP.  9. For diabetes, we will start sliding scale and continue Levemir.  10. For uncontrolled hypertension, will give hydralazine IV p.r.n. and start Lopressor.   I discussed the patient's condition and recommendations with the patient.   TIME SPENT: About 65 minutes.   ____________________________ Shaune PollackQing Blenda Wisecup, MD qc:lb D: 07/15/2013 11:16:50 ET T: 07/15/2013 11:43:48 ET JOB#: [id number]  cc: Shaune PollackQing Marcey Persad, MD, <Dictator> Shaune PollackQING Sabian Kuba MD ELECTRONICALLY SIGNED 07/15/2013 16:00

## 2014-06-04 NOTE — Consult Note (Signed)
PATIENT NAME:  Spencer Edwards, Spencer Edwards MR#:  [id number] DATE OF BIRTH:  March 18, 1940  DATE OF CONSULTATION:  07/15/2013  REFERRING PHYSICIAN:  Dr. Thelma Barge. CONSULTING PHYSICIAN:  Hektor Huston C. Xyon Lukasik, MD  REASON FOR CONSULTATION: Urinary retention and gross hematuria.   CHIEF COMPLAINT:  Unable to urinate.   HISTORY OF PRESENT ILLNESS: A 74 year old white male presented to the Emergency Department with a several day history of urinary hesitancy, decreased force and caliber of urinary stream and sensation of incomplete emptying.  For approximately 4 days, he was taking a Dulcolax pill, which he states went down "wrong way" and began to have severe coughing. Shortly after that time, he noted blood per meatus and started to have shortness of breath. He was seen in the Emergency Department and a CT scan of the abdomen was done which incidentally showed a large right-sided pneumothorax. Chest tube was placed by Dr. Thelma Barge.  He had a Foley catheter placed with return of 400 mL of bloody urine. He states I saw him approximately 3 years ago for an episode of urinary retention with catheter removal. He has not seen a urologist since that time.   PAST MEDICAL HISTORY:  1.  Peripheral vascular disease.  2.  History of bowel obstruction.  3.  Insulin-dependent diabetes.  4.  Hypertension.  5.  COPD.   PAST SURGICAL HISTORY:   1.  Right BKA. 2.  Exploratory laparotomy for small bowel obstruction.   SOCIAL HISTORY:  Prior tobacco and alcohol use, none at present.   ALLERGIES:  NKDA.   MEDICATIONS ON ADMISSION:  Multivitamins daily,  Lantus 24 units subQ daily, Colace 100 mg at twice daily as needed, ASA 81 mg daily, Advair 250 mcg b.i.d.    REVIEW OF SYSTEMS:   GENERAL: Denies fever, chills.  EYES: No visual changes.  ENT: No complaints.  CARDIOVASCULAR: Denies chest pain, palpitations.  PULMONARY: Positive shortness of breath. Prior chest tube placement.  GASTROINTESTINAL: Lower abdominal discomfort.   GENITOURINARY: As per the HPI.  NEUROLOGIC: No history of CVA, TIA or seizure.  HEMATOLOGIC: No history of bleeding or clotting disorders.  ENDOCRINE: No heat/cold intolerance. DERMATOLOGIC:  Denies rash.   PHYSICAL EXAMINATION:  VITAL SIGNS:  Temp is 98.4, pulse 110, BP 110/68.  GENERAL: Alert male in no acute distress.  HEENT: Oral cavity is clear.  ABDOMEN: Soft, nontender.  GENITOURINARY:   Phallus is without lesions. Foley catheter in place, draining clear urine.  Prostate exam was deferred at this time secondary to chest tube discomfort and the need for lateral decubitus position. The prostate will be examined at a later date.   IMAGING AND LABORATORY DATA: Urinalysis was red and cloudy.  He had 3+ leukocytes, 12,000 RBCs, 603 WBCs.  Urine culture is pending. Creatinine is 1.10. H and H 6.5/17.8. CT of the abdomen and pelvis without contrast was reviewed. A Foley catheter is in place. The bladder wall is thickened. There is moderate prostate enlargement. No gross renal abnormalities on this noncontrast study.   IMPRESSION: 1.  Urinary retention, most likely secondary to benign prostatic hyperplasia.  2.  Gross hematuria, most likely prostatic in origin.   RECOMMENDATION:  1.  Would leave his Foley catheter indwelling for 5 to 7 days and followup office visit for catheter removal and voiding trial.  2.  Start tamsulosin 0.4 mg daily.  3.  Standard hematuria evaluation would consist of a CT urogram and cystoscopy. His noncontrast CT is unremarkable. We will follow up with outpatient cystoscopy and  if no obvious source of his hematuria and persistent hematuria, we will proceed with a CT urogram.    ____________________________ Verna CzechScott C. Lonna CobbStoioff, MD scs:dmm D: 07/15/2013 18:56:23 ET T: 07/15/2013 19:19:37 ET JOB#: [id number]  cc: Lorin PicketScott C. Lonna CobbStoioff, MD, <Dictator> Riki AltesSCOTT C Letecia Arps MD ELECTRONICALLY SIGNED 07/21/2013 15:20

## 2014-06-04 NOTE — Discharge Summary (Signed)
PATIENT NAME:  Spencer BrunnerPPERSON, Durant T MR#:  [id number] DATE OF BIRTH:  [birth year]  DATE OF ADMISSION:  07/15/2013 DATE OF DISCHARGE:  07/19/2013  ADMITTING DIAGNOSES:  1.  Right-sided spontaneous pneumothorax.  2.  Urinary retention with gross hematuria.  3.  Sacral decubitus.   HOSPITAL COURSE: Mr. Shanda BumpsRichard Fano is a 74 year old gentleman with a long-standing history of tobacco use, peripheral vascular disease, status post lower extremity amputation, who presented to the Emergency Department on 07/15/2013 with a chief complaint of coughing, shortness of breath and difficulty passing his urine. While in the Emergency Department, he had a Foley catheter placed which revealed gross hematuria and also had a chest x-ray done which revealed a significant right-sided pneumothorax. A Foley catheter was placed to straight drainage and a urology consultation was obtained. The right-sided pneumothorax was managed with a percutaneous chest tube and he was admitted to the hospital where, over the next several days, he was seen in consultation by Dr. Lonna CobbStoioff. The recommendation was to leave his Foley catheter in place and to follow up with Dr. Lonna CobbStoioff off on discharge. His chest tube was placed to water seal and was ultimately removed with his chest x-ray showing complete expansion of the right lung.   DISPOSITION:  He was discharged to home on 07/19/2013 and was instructed to follow up with Dr. Thelma Bargeaks in 1 week and Dr. Lonna CobbStoioff in 1 week as well.   DISCHARGE MEDICATIONS: Included Advair, Colace, Lantus, aspirin, multivitamins, Norco tamsulosin.    ____________________________ Sheppard Plumberimothy E. Thelma Bargeaks, MD teo:cs D: 07/28/2013 15:00:29 ET T: 07/28/2013 15:09:31 ET JOB#: [id number]  cc: Marcial Pacasimothy E. Thelma Bargeaks, MD, <Dictator> Jasmine DecemberIMOTHY E OAKS MD ELECTRONICALLY SIGNED 08/13/2013 11:56

## 2014-06-04 NOTE — Consult Note (Signed)
PATIENT NAME:  Spencer Edwards, Spencer T MR#:  [id number] DATE OF BIRTH:  1940/03/23  DATE OF CONSULTATION:  01/08/2014  REASON FOR CONSULTATION: Enlarging right pneumothorax.   HISTORY OF PRESENT ILLNESS: Mr. Spencer Edwards is a pleasant 74 year old male who has a history of spontaneous pneumothorax, which was treated by Dr. Thelma Bargeaks in the past as well as diabetes and multiple medical problems who is admitted on 11/19 for unresponsiveness, hypertension and subsequent hypotension.  He was noted to have an elevated troponin and has been admitted.  There has been some difficulties with extubation.  He was noted to have a pneumothorax which was small.  A few days after admission, it has gotten gradually larger and is approximately 3 cm and is labeled small or moderate. I was consulted for tube thoracostomy.   PAST MEDICAL HISTORY:  1.  History of diabetes mellitus.  2.  History of right spontaneous pneumothorax status post tube thoracostomy 07/2013.  3.  History of urinary retention with hematuria.  4.  History of decubitus ulcer.  5.  Hypertension. 6.  Peripheral vascular disease.  7.  Status post right below-knee amputation.  8.  History of tobacco abuse and chronic obstructive pulmonary disease.  9.  History of bowel obstruction post surgery.   SOCIAL HISTORY: Denies any tobacco or alcohol. No drug use.   FAMILY HISTORY: Mother with diabetes.    ALLERGIES: None.   HOME MEDICATIONS: Lantus, Colace, aspirin, Advair, multivitamin.  Norco.   REVIEW OF SYSTEMS:  Unable to obtain review of systems due to the patient being intubated and sedated.   PHYSICAL EXAMINATION:  VITAL SIGNS: Temperature 37.6, pulse 76, blood pressure 118/63 and 20 on ventilator.  GENERAL: No acute distress, sedated.  HEAD: Normocephalic, atraumatic.  EYES: No scleral icterus. No conjunctivitis. Face: No obvious facial trauma. Normal external nose. Normal external ears.  HEART: Regular rate and rhythm. No murmurs, rubs, or gallops.   ABDOMEN: Soft, nontender, nondistended.  EXTREMITIES:  Sedated and unable to assess.  NEUROLOGIC: Unable to assess sedated.   LABORATORY DATA:  This morning showed a white cell count of 11.1, hemoglobin 12.1, hematocrit 37.8, platelets 157,000, creatinine 1.6, BUN 69. Chest x-ray showed small to moderate pneumothorax. Increased over previous x-rays.   ASSESSMENT AND PLAN: Mr. Spencer Edwards is a pleasant 74 year old with a history of spontaneous pneumothorax who appears to have a second spontaneous pneumothorax. We will place tube thoracostomy and place on suction, if tube is in appropriate condition.  We will consult Dr. Thelma Bargeaks for further management on Monday. We will have patient consented.  Procedure to follow.     ____________________________ Si Raiderhristopher A. Marrion Accomando, MD cal:DT D: 01/08/2014 13:18:00 ET T: 01/08/2014 14:24:10 ET JOB#: [id number]  cc: Spencer Deerhristopher A. Nakiyah Beverley, MD, <Dictator> Spencer NewcomerHRISTOPHER A Spencer Alarie MD ELECTRONICALLY SIGNED 01/17/2014 20:11

## 2014-06-04 NOTE — Consult Note (Signed)
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 Upon his arrival to [facility] he continued to be poorly responsive and was found to have a severely elevated BP of 247/190. He received IV labetalol which dropped his SBP into the 60s. He subsequently received IV fluids and required pressors. His mental status continued to decline requiring intubation. Troponin was found to be 29. EKG showed sinus tachycardia 120, inferolateral TWI. He was found to have an anion gap metabolic acidosis and elevated LFTs. He was placed on empiric antibiotics (Zosyn & Levaquin). Echo is pending. Cardiology is consulted for further management of the above.  _______________  PMH: 1. Uncontrolled DM2 2. HTN 3. PVD 4. Recent right spontaneous pneumothorax status post tube thoracostomy in 07/2013   5. Urinary retention with gross hematuria followed at [facility] 6. 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 COPD 8. Tobacco abuse ________________   Present Illness 74 year old male who presented to [facility] with the above problem list with the above complaints.   Patient was found at his home by [employer] to be poorly responsive. Apparently he had been noting some lower extremity pain that prevented him from attending church the previous day. He was found by [employer] to be slumped over sitting in his own urine, with hematuria present upon arrival at the hospital. He was transported to the hospital and found to be severely hypertensive  with a BP of 247/190. He received IV labetalol which dropped his SBP into the 60s requiring 3-4 liters of IV fluid. Around this same time his mental status declined requiring intubation to maintain his airway. He was sedated with Versad. He also required pressors. A central line was placed in his right IJ. Labs revealed troponin of 29-->16.00-->21.00. SCr 5.06-->4.15, BUN 67--> 72, AST 318-->277, ALT 97-->88, T bili 2.2-->2.9,  Total CK 8635--> 4554, WBC 15-->24, Blood culture with GNR, UA numerous WBC/RBC, EKG sinus tach, 120, inferolateral TWI. CT head no acute intra cranial findings. CT abdomen/pelvis No acute findings in the abdomen pelvis. No evidence of bowel obstruction or infection, status post right emicolectomy without complication, foley catheter the bladder, trace residual pneumothorax at the right lung base, no partial pneumothorax on comparison radiograph. Bibasilar atelectasis. Echo is pending.   Physical Exam:  GEN intubated and sedated   HEENT PERRL   NECK supple   RESP clear BS   CARD Regular rate and rhythm  Normal, S1, S2  No murmur   ABD denies tenderness  soft   EXTR negative edema   SKIN normal to palpation   NEURO intubated and sedated   PSYCH intubated and sedated   Review of Systems:  ROS Pt not able to provide ROS  intubated and sedated   Family & Social History:  Family and Social History:  Family History intubated and sedated   Social History intubated and sedated   Place of Living Home     Peripheral Arterial Disease:    Gastric Reflux:    Hypertension:    Heart Murmer:    Diabetes Mellitus,Type I (IDD):    COPD:  BKA 2013:    Right foot partial amputation: Nov 2012   Bowel Obstruction:    PICC line right upper arm:   Home Medications: Medication Instructions Status  acetaminophen-HYDROcodone 325 mg-5 mg oral tablet 1 tab(s) orally every 4 hours, As needed, pain Active  tamsulosin 0.4 mg oral capsule 1 cap(s) orally once a  day (after a meal) Active   Lab Results:  Thyroid:  20-Nov-15 03:55   Thyroid Stimulating Hormone 2.13 (0.45-4.50 (IU = International Unit)  ----------------------- Pregnant patients have  different reference  ranges for TSH:  - - - - - - - - - -  Pregnant, first trimetser:  0.36 - 2.50 uIU/mL)  Hepatic:  20-Nov-15 03:55   Bilirubin, Total  2.9  Alkaline Phosphatase 102 (46-116 NOTE: New Reference Range 08/31/13)  SGPT (ALT)  88 (14-63 NOTE: New Reference Range 08/31/13)  SGOT (AST)  277  Total Protein, Serum  5.7  Albumin, Serum  2.2  Routine Micro:  19-Nov-15 12:36   Micro Text Report BLOOD CULTURE   COMMENT                   NO GROWTH IN 8-12 HOURS   GRAM STAIN                GRAM NEGATIVE ROD   ANTIBIOTIC                       Culture Comment NO GROWTH IN 8-12 HOURS  Gram Stain 1 GRAM NEGATIVE ROD  Routine Chem:  19-Nov-15 12:08   Result Comment BLOOD CULTURE - NOTIFIED OF CRITICAL VALUE  - READ-BACK PROCESS PERFORMED.  - CALLED TO BETH BAONO AT 7939 ON 12/31/13  - [place]  Result(s) reported on 31 Dec 2013 at 07:43AM.    12:36   Result Comment BLOOD CULTURE - NOTIFIED OF CRITICAL VALUE  - READ-BACK PROCESS PERFORMED.  - CALLED TO BETH BAONO AT 0300 ON 12/31/13  - [place]  Result(s) reported on 31 Dec 2013 at 02:18AM.    13:23   Result Comment UA DIPSTICK - Unable to obtain valid dipstick results  - due to interference of gross blood in the  - specimen.  Result(s) reported on 30 Dec 2013 at 09:51PM.  20-Nov-15 03:55   Phosphorus, Serum  5.0 (Result(s) reported on 31 Dec 2013 at 04:52AM.)  Magnesium, Serum 2.2 (1.8-2.4 THERAPEUTIC RANGE: 4-7 mg/dL TOXIC: > 10 mg/dL  -----------------------)  Result Comment TOTAL CK - RESULTS VERIFIED BY REPEAT TESTING.  Result(s) reported on 31 Dec 2013 at 05:03AM.  Glucose, Serum  386  BUN  72  Creatinine (comp)  4.15  Sodium, Serum 138  Potassium, Serum 5.0  Chloride, Serum 104  CO2, Serum  20  Calcium (Total), Serum   6.6  Osmolality (calc) 313  eGFR (African American)  18  eGFR (Non-African American)  15 (eGFR values <65m/min/1.73 m2 may be an indication of chronic kidney disease (CKD). Calculated eGFR, using the MRDR Study equation, is useful in  patients with stable renal function. The eGFR calculation will not be reliable in acutely ill patients when serum creatinine is changing rapidly. It is not useful in patients on dialysis. The eGFR calculation may not be applicable to patients at the low and high extremes of body sizes, pregnant women, and vegetarians.)  Anion Gap 14  Urine Drugs:  [id number]  Tricyclic Antidepressant, Ur Qual (comp) NEGATIVE (Result(s) reported on 30 Dec 2013 at 04:08PM.)  Amphetamines, Urine  Qual. NEGATIVE  MDMA, Urine Qual. NEGATIVE  Cocaine Metabolite, Urine Qual. NEGATIVE  Opiate, Urine qual NEGATIVE  Phencyclidine, Urine Qual. NEGATIVE  Cannabinoid, Urine Qual. NEGATIVE  Barbiturates, Urine Qual. NEGATIVE  Benzodiazepine, Urine Qual. NEGATIVE (----------------- The URINE DRUG SCREEN provides only a preliminary, unconfirmed analytical test result and should not be used for non-medical  purposes.  Clinical consideration and professional judgment should be  applied to any positive drug screen result due to possible interfering substances.  A more specific alternate chemical method must be used in order to obtain a confirmed analytical result.  Gas chromatography/mass spectrometry (GC/MS) is the preferred confirmatory method.)  Methadone, Urine Qual. NEGATIVE  Cardiac:  19-Nov-15 12:08   Troponin I  29.00 (0.00-0.05 0.05 ng/mL or less: NEGATIVE  Repeat testing in 3-6 hrs  if clinically indicated. >0.05 ng/mL: POTENTIAL  MYOCARDIAL INJURY. Repeat  testing in 3-6 hrs if  clinically indicated. NOTE: An increase or decrease  of 30% or more on serial  testing suggests a  clinically important change)    16:28   Troponin I  16.00 (0.00-0.05 0.05 ng/mL  or less: NEGATIVE  Repeat testing in 3-6 hrs  if clinically indicated. >0.05 ng/mL: POTENTIAL  MYOCARDIAL INJURY. Repeat  testing in 3-6 hrs if  clinically indicated. NOTE: An increase or decrease  of 30% or more on serial  testing suggests a  clinically important change)    20:00   Troponin I  21.00 (0.00-0.05 0.05 ng/mL or less: NEGATIVE  Repeat testing in 3-6 hrs  if clinically indicated. >0.05 ng/mL: POTENTIAL  MYOCARDIAL INJURY. Repeat  testing in 3-6 hrs if  clinically indicated. NOTE: An increase or decrease  of 30% or more on serial  testing suggests a  clinically important change)  20-Nov-15 03:55   CK, Total  4554 (39-308 NOTE: NEW REFERENCE RANGE  03/15/2013)  Routine UA:  19-Nov-15 13:23   Color (UA) RED  Clarity (UA) CLOUDY  Glucose (UA) see comment  Bilirubin (UA) see comment  Ketones (UA) see comment  Specific Gravity (UA) 1.016  Blood (UA) see comment  pH (UA) see comment  Protein (UA) see comment  Nitrite (UA) SEE COMMENT  Leukocyte Esterase (UA) see comment  RBC (UA) 2808 /HPF  WBC (UA) 2128 /HPF  Bacteria (UA) 2+  Epithelial Cells (UA) NONE SEEN  WBC Clump (UA) PRESENT (Result(s) reported on 30 Dec 2013 at 09:51PM.)  Routine Coag:  20-Nov-15 03:55   Prothrombin  15.7  INR 1.3 (INR reference interval applies to patients on anticoagulant therapy. A single INR therapeutic range for coumarins is not optimal for all indications; however, the suggested range for most indications is 2.0 - 3.0. Exceptions to the INR Reference Range may include: Prosthetic heart valves, acute myocardial infarction, prevention of myocardial infarction, and combinations of aspirin and anticoagulant. The need for a higher or lower target INR must be assessed individually. Reference: The Pharmacology and Management of the Vitamin K  antagonists: the seventh ACCP Conference on Antithrombotic and Thrombolytic Therapy. NGEXB.2841 Sept:126 (3suppl): [id number]. A HCT value  >55% may artifactually increase the PT.  In one study,  the increase was an average of 25%. Reference:  "Effect on Routine and Special Coagulation Testing Values of Citrate Anticoagulant Adjustment in Patients with High HCT Values." American Journal of Clinical Pathology 2006;126:400-405.)  Routine Hem:  20-Nov-15 03:55   WBC (CBC)  24.2  RBC (CBC) 4.77  Hemoglobin (CBC) 13.3  Hematocrit (CBC) 41.5  Platelet Count (CBC)  80  MCV  87  MCH 27.9  MCHC 32.1  RDW  14.6  Neutrophil % 94.7  Lymphocyte % 1.9  Monocyte % 2.5  Eosinophil % 0.7  Basophil % 0.2  Neutrophil #  22.9  Lymphocyte #  0.5  Monocyte # 0.6  Eosinophil # 0.2  Basophil # 0.0 (Result(s) reported on 31 Dec 2013 at 04:41AM.)   EKG:  EKG Interp. by me   Interpretation sinus tachycardia, 120, TWI inferolateral   Radiology Results: XRay:    20-Nov-15 06:29, Chest Portable Single View  Chest Portable Single View   REASON FOR EXAM:    on vent  COMMENTS:       PROCEDURE: DXR - DXR PORTABLE CHEST SINGLE VIEW  - Dec 31 2013  6:29AM     CLINICAL DATA:  Status post intubation.    EXAM:  PORTABLE CHEST - 1 VIEW    COMPARISON:  December 30, 2013.    FINDINGS:  Stable cardiomediastinal silhouette. Endotracheal tube is in grossly  good position with distal tip 6.5 cm above the carina. Right  internal jugular catheter line is noted with distal tip in expected  position the cavoatrial junction. Nasogastric tube is seen passing  through esophagus into stomach. Stable mild right apical  pneumothorax is noted. Stable minimal left basilar subsegmental  atelectasis is noted. Minimal left pleural effusion is noted.     IMPRESSION:  Stable mild right apical pneumothorax.    Stable minimal left basilar subsegmental atelectasis.    Stable support apparatus.      Electronically Signed    By: Sabino Dick M.D.    On: 12/31/2013 08:27         Verified By: Marveen Reeks, M.D.,    No Known Allergies:   Vital  Signs/Nurse's Notes: **Vital Signs.:   20-Nov-15 06:00  Celsius 36.3  Pulse Pulse 91  Respirations Respirations 24  Systolic BP Systolic BP 664  Diastolic BP (mmHg) Diastolic BP (mmHg) 82  Mean BP 95  Pulse Ox % Pulse Ox % 100  CO2 Monitor CO2 Monitor 24    Impression 74 year old male with history of uncontrolled DM2, HTN, PVD, recent right spontaneous pneumothorax status post tube thoracostomy in 07/2013, urinary retention with gross hematuria followed at [facility], 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  1. NSTEMI/cardiogenic shock: -I suspect with a troponin elevation of 29 coupled with inferolateral EKG changes this is likely a true ischemic event -Troponin 29.0-->16.0-->21.0 -Echo pending for today -Will need cardiac cath when stable  -Hold heparin gtt & aspirin as he has active hematuria -Check FLP and place on optimal medical therapy  -Hold b-blocker at current time as he is on pressors  2. Septic shock: -Blood culture 1/2 growing GNR -Gram stain 2/2 with GNR -On Zosyn, Levaquin, & Vancomycin  3. Acute renal failure: -SCr 5.06-->4.15 -Possibly from ATN (SBP in the 60s) -Renal has been consulted  4. Metabolic acidosis:  -Possibly 2/2 acute renal failure -Follow  5. AMS: -Ammonia <10 -UDS negative -Follow with supportive & empiric treatment   6. DM2: -Uncontrolled -SSI per IM   Electronic Signatures for Addendum Section:  Kathlyn Sacramento (MD) (Signed Addendum [phone] 14:57)  The patient was seen and examined. Agree with the above. He is intubated and sedated. His presentation is consistent with septic shock with significant  NSTEMI. Continue treatment of underlying condition. Further ischemic cardiac work up if overall condition improves.   Electronic  Signatures: Kathlyn Sacramento (MD)  (Signed [phone] 14:57)  Co-Signer: General Aspect/Present Illness, Home Medications, Allergies Rise Mu (PA-C)  (Signed 20-Nov-15 09:25)  Authored: General Aspect/Present Illness, History and Physical Exam, Review of System, Family & Social History, Past Medical History, Home Medications, Labs, EKG , Radiology, Allergies, Vital Signs/Nurse's Notes, Impression/Plan   Last Updated: 20-Nov-15 14:57 by Kathlyn Sacramento (MD)

## 2014-06-05 NOTE — Op Note (Signed)
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   PROCEDURE PERFORMED:  1. Abdominal aortogram.  2. Left lower extremity distal runoff, third order catheter placement.  3. Additional third order catheter placement in the peroneal artery.  4. Percutaneous transluminal angioplasty to 3 mm left anterior tibial artery.  5. Percutaneous transluminal angioplasty to 2.7 mm left peroneal artery.   SURGEON: Levora Dredge, M.D.   SEDATION: Versed 4 mg plus fentanyl 150 mcg administered IV. Continuous ECG, pulse oximetry, and cardiopulmonary monitoring was performed throughout the entire procedure by the interventional radiology nurse. Total sedation time is 1.5 hours.   ACCESS: 6 French sheath, right common femoral artery.   CONTRAST USED: Isovue 110 mL.   FLUORO TIME: 18.6 minutes.   INDICATIONS: Mr. Pecore is a 74 year old gentleman who recently underwent amputation of the right lower extremity secondary to gangrenous changes to the foot related to diabetic foot infection and atherosclerotic occlusive disease. During his postoperative rehab he was noted to develop gangrenous changes with ulceration of the left heel. He has known atherosclerotic occlusive disease of this leg and is now undergoing angiography with the hope for intervention for limb salvage. Risks and benefits were reviewed. All questions are answered. The patient agreed to proceed.   DESCRIPTION OF PROCEDURE: The patient is taken to Special Procedures and placed in the supine position. After adequate sedation is achieved, both groins are prepped and draped in sterile fashion. 1% lidocaine is infiltrated into the soft  tissues overlying the right common femoral impulse and access to the common femoral artery is obtained with a micropuncture needle, microwire followed by micro sheath, J-wire followed by 5 French sheath, and 5 French pigtail catheter. The pigtail catheter is positioned at the level of T12 and bolus injection of contrast is utilized to create an image in the AP projection. The pigtail catheter is repositioned to the bifurcation and LAO and RAO views of the iliac system are obtained. A stiff angled Glidewire and pigtail catheter are then used to cross the aortic bifurcation and the catheter is advanced down into the superficial femoral artery. Distal runoff is then obtained, although images below the knee are inadequate. Images did demonstrate profound disease at the level of the trifurcation.   5000 units of heparin is given. A stiff angled Glidewire is reintroduced and the pigtail catheter is removed. The 5 French sheath is exchanged for a 6 [place] Ansel sheath, which is advanced up and over the bifurcation. 125 straight slip catheter is then advanced over the stiff angled Glidewire and the catheter and Glidewire combination negotiated down to the anterior tibial. Anterior tibial appears to occlude approximately 3 cm distal to its origin and remains occluded throughout its course. Distal images of the foot demonstrate the dorsalis pedis does reconstitute. The peroneal artery is also noted and it is diffusely and heavily diseased in its proximal one third. The distal two thirds are patent and do have collaterals that reconstitute the dorsalis pedis. Lateral plantar and posterior tibial are essentially nonvisualized throughout their course. There are small, short-segment islands of what appears to be the posterior tibial throughout its length; however, the lateral plantar and the distal posterior tibial are essentially absent. Given the nature of the  patient's disease, it was felt attempts at salvage via opening the  anterior tibial artery would be of benefit and therefore a combination of the Glidewire and the slip catheter supplemented with a V18 wire and CXI 018 catheter were utilized. Ultimately after multiple images and repetitive attempts at advancing  wire and catheter down to the ankle, success was achieved and an image of the dorsalis pedis was obtained via the catheter demonstrating that the entire lesion had been crossed successfully. A 2.5 x 10 Fox balloon was used to dilate the artery serially. Four separate inflations were required. Subsequently, a 3 x 20 balloon was used to dilate the anterior tibial artery in its mid and proximal two thirds. Follow-up angiography now demonstrated quite nice anterior tibial with multiple collaterals extending off of it with rapid flow. At the origin of the dorsalis pedis there remains a short segment of approximately 70% stenosis and therefore, with the 0.014 wire in place, a 2.5 x 4-cm balloon was advanced across this area and inflated to 14 atmospheres. Nitro was also given, a total of 500 micrograms. Follow-up angiography then demonstrated excellent result and there is less than 10% residual stenosis throughout the entire course of the anterior tibial.   Attention was then turned to the peroneal artery. The catheter and wire were pulled back into the tibioperoneal trunk. Hand injection of contrast was used to identify the origin of the peroneal. Using a combination of the Glidewire and the grand slam wire, the catheter was negotiated into the peroneal beyond the stenoses. Hand injection of contrast was then used to demonstrate distal runoff and this represents the additional third order catheter placement.  0.014 wire was reintroduced and a 2.5 x 10 Fox balloon was used to serial angioplasty the proximal one third of the peroneal. Follow-up angiography demonstrated an excellent result with good luminal gain and less than 5 to 10% residual stenosis. Therefore the wire was  removed. The sheath was pulled back into the right external iliac, J-wire was advanced and an oblique view was obtained. A StarClose device was successfully deployed. There were no immediate complications.   INTERPRETATION:  The abdominal aorta and iliac systems are widely patent.   The left common femoral and profunda femoris are widely patent with fairly poor collateralization through the geniculates distally. SFA is patent throughout its course as is the popliteal. 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 Peroneal appears to come directly off the anterior tibial. The anterior tibial is occluded approximately 3 cm from its origin and remains occluded down to the dorsalis pedis. Following the above-noted intervention with a maximal balloon inflation to 3 mm, there is wide patency with an excellent result throughout the entire anterior tibial. Multiple side branches are preserved.   Following angioplasty using a 2.5-mm Fox balloon inflated to 20 atmospheres, which represents a 2.8-mm lumen, the peroneal artery is recanalized as well.   SUMMARY: Successful recanalization of the left lower extremity tibial disease with in-line flow via the anterior tibial and peroneal arteries.   ____________________________ Renford DillsGregory G. Schnier, MD ggs:bjt D: 03/19/2011 16:17:54 ET T: 03/19/2011 17:02:50 ET JOB#: [id number]  cc: Renford DillsGregory G. Schnier, MD, <Dictator> Argentina DonovanJustin A. Ether GriffinsFowler, DPM Duane LopeJeffrey D. Judithann SheenSparks, MD Renford DillsGREGORY G SCHNIER MD ELECTRONICALLY SIGNED 04/08/2011 10:33

## 2014-06-05 NOTE — Discharge Summary (Signed)
PATIENT NAME:  Spencer BrunnerPPERSON, Temitope T MR#:  [id number] DATE OF BIRTH:  04/12/1940  DATE OF ADMISSION:  02/22/2011 DATE OF DISCHARGE:  02/27/2011  FINAL DIAGNOSES: 1. Gangrene of the right lower extremity.  2. Atherosclerotic occlusive disease.  3. Diabetes mellitus.  PROCEDURE PERFORMED DURING THIS ADMISSION: Right below-the-knee amputation done by Dr. Levora DredgeGregory Schnier on 02/22/2011.   COMPLICATIONS: None.  CONSULTATIONS: Dr. Lonna CobbStoioff for postoperative urinary retention.   HOSPITAL COURSE: The patient was admitted. He underwent his procedure on 02/22/2011 without complication. Following surgery he was transferred from the post-anesthesia care unit to the hospital floor where his vital signs remained stable. He received preoperative and postoperative IV antibiotics and required no transfusion during this admission. His right BKA incision remained clean, dry, and intact. His vital signs remained stable and was deemed stable for discharge on 02/27/2011.   LABORATORY DATA AS OF 02/24/2011: Platelet count 216. Chemistries appeared stable.   DIAGNOSTICS: There were no diagnostics.   DISCHARGE MEDICATIONS: 1. MiraLAX powder. 2. Flomax. 3. Novolin R injections. 4. Percocet 5/325 mg tablets. 5. Advair Diskus 250. 6. Aspirin 81 mg. 7. Colace 200 mg. 8. Cozaar 100 mg. 9. Hydrochlorothiazide 25 mg. 10. Lipitor 10 mg. 11. Lovenox injections 30 mg sub-Q every 12 hours. 12. Protonix 40 mg. 13. Nystatin cream.  DISPOSITION: The patient is discharged to skilled nursing facility for rehabilitation in stable condition on 02/27/2011.   ACTIVITY: He will be on as tolerated heavy lifting status.  FOLLOW-UP:  1. The patient does have a follow-up appointment with [facility] in two weeks at which time we will check his wound as well as remove staples.  2. The patient also has a follow-up evaluation appointment with Dr. Heywood FootmanStoioff's office concerning his urinary retention.    DISCHARGE INSTRUCTIONS: The patient understands to call with any problems or questions.   ____________________________ Terrence DupontJason M. Janica Eldred, PA-C jmk:drc D: 02/27/2011 12:36:53 ET T: 02/27/2011 12:46:15 ET JOB#: [id number]  cc: Terrence DupontJason M. Rony Ratz, PA-C, <Dictator> Terrence DupontJASON M Reniah Cottingham PA ELECTRONICALLY SIGNED 04/04/2011 12:28

## 2014-06-05 NOTE — Op Note (Signed)
PATIENT NAME:  Spencer BrunnerPPERSON, Spencer T MR#:  [id number] DATE OF BIRTH:  02-Jul-1940  DATE OF PROCEDURE:  05/15/2011  PREOPERATIVE DIAGNOSIS: Left heel full-thickness ulceration.   POSTOPERATIVE DIAGNOSIS: Left heel full-thickness ulceration.   PROCEDURES:  1. Excisional debridement left heel to subcutaneous tissue.  2. Wound VAC placement, left heel.   SURGEON: Anne-Marie Genson A. Ether GriffinsFowler, DPM  ANESTHESIA: General.   HEMOSTASIS: Epinephrine 1:200,000 infiltrated along incision site.   COMPLICATIONS: None.   SPECIMEN: None.   OPERATIVE INDICATIONS: The patient is a 74 year old gentleman who sustained a left heel ulceration. He has been treated by Vascular Surgery for revascularization and referred to us for treatment of the heel ulceration. All risks, benefits, alternatives, and complications associated with surgery were discussed with the patient and full consent has been given.   OPERATIVE PROCEDURE: The patient was brought into the OR and placed on the operating table in the prone position. IV sedation was administered by the anesthesia team. The left lower extremity was prepped and draped in the usual sterile fashion. The surrounding ulceration was anesthetized with lidocaine with epinephrine 1:200,000. After sterile prep and drape, the ulcer was painted with methylene blue. Next, the Versajet was used to remove excisionally down to subcutaneous tissue all the necrotic fibrotic tissue. A second painting with methylene blue was then placed and the Versajet was used once again to remove all surrounding fibrotic necrotic tissue. The wound itself measured about 5 x 3.5 cm. At this time there was only mild bleeding noted to the area. The Wound VAC was then placed and cut appropriately to the left heel. He tolerated the procedure and anesthesia well and was transported from the OR to the PAC-U with all vital signs stable and neurovascular status intact.   I will see him in the outpatient clinic in two weeks. He  will have Wound VAC changes performed on a routine regular basis.   ____________________________ Argentina DonovanJustin A. Ether GriffinsFowler, DPM jaf:drc D: 05/15/2011 12:55:20 ET T: 05/15/2011 13:18:14 ET JOB#: [id number]  cc: Jill AlexandersJustin A. Ether GriffinsFowler, DPM, <Dictator> Tyrice Hewitt DPM ELECTRONICALLY SIGNED 06/04/2011 11:23

## 2014-06-05 NOTE — Op Note (Signed)
PATIENT NAME:  Spencer BrunnerPPERSON, Lean T MR#:  [id number] DATE OF BIRTH:  [birth year]  DATE OF PROCEDURE:  02/22/2011  PREOPERATIVE DIAGNOSES:  1. Gangrene of the right lower extremity.  2. Atherosclerotic occlusive disease.  3. Diabetes.   POSTOPERATIVE DIAGNOSES:  1. Gangrene of the right lower extremity.  2. Atherosclerotic occlusive disease.  3. Diabetes.   PROCEDURE PERFORMED: Right below-knee amputation.   SURGEON: Renford DillsGregory G. Schnier, MD  ANESTHESIA: General by endotracheal intubation.   FLUIDS: Per anesthesia record.   ESTIMATED BLOOD LOSS: 900 mL.   SPECIMEN: Distal right lower extremity to pathology, permanent section.   INDICATIONS: [name] is a 74 year old gentleman who presented several months ago with wet gangrene of his foot. He has undergone extensive debridement and attempt at reconstruction, however, he has not healed this and is now returning for a formal below-knee amputation. Risks and benefits were reviewed. All questions are answered. Patient agrees to proceed.   DESCRIPTION OF PROCEDURE: Patient is taken to the Operating Room, placed in the supine position. After adequate general anesthesia is induced and appropriate invasive monitors are placed, he is positioned supine. Right leg is prepped and draped in a sterile fashion. Approximately one handbreadth below the patellar tendon circumferential mark is made and an umbilical tape is used to measure out a posterior flap.   Using a 10 blade scalpel skin incision is created circumferentially. Saphenous vein is ligated between 3-0 Vicryl ties. Fascia is then incised. Muscle bellies are transected with Bovie cautery. Periosteum is elevated around the fibula and the tibia. Tibia is transected with the Gigli saw. The fibula is transected with bone shears. Post amputation knife is then used to transect the posterior muscle bellies. Hemostats are then applied.   Bleeding points are then ligated with figure-of-eight 0 Vicryl  or 3-0 Vicryl. Nerve is identified, dissected from the surrounding tissues, retracted distally and highly ligated and allowed to retract up into the muscle bellies. The end of the tibia is filed down with a rasp. The wound is then irrigated with a liter of saline and the fascia is closed with interrupted 0 Vicryl. Skin is closed with staples and sterile dressing is applied. Patient tolerated procedure well and there were no immediate complications. He is taken to the recovery area in excellent condition.  ____________________________ Renford DillsGregory G. Schnier, MD ggs:cms D: 02/22/2011 16:34:25 ET T: 02/22/2011 17:14:47 ET JOB#: [id number]  cc: Renford DillsGregory G. Schnier, MD, <Dictator> Argentina DonovanJustin A. Ether GriffinsFowler, DPM  Renford DillsGREGORY G SCHNIER MD ELECTRONICALLY SIGNED 03/02/2011 12:29

## 2014-06-05 NOTE — Consult Note (Signed)
Reason for consultation: Urinary retention of present illness: He is status post a right BKA on 1/11 and has had postoperative urinary retention.  Catheter was replaced for the second time on 1/14 with greater than 800 cc of urine obtained.  He was started on Flomax on 1/13.  He has a history of postoperative urinary retention which resolved after a brief period of catheter drainage. Foley catheter draining clear urine Postoperative urinary retentionDischarge patient with an indwelling Foley.  Followup with me next week for voiding trial.  Prostate exam will be performed on followup.  Continue Flomax.  Electronic Signatures: Riki AltesStoioff, Ruchel Brandenburger C (MD)  (Signed on 15-Jan-13 16:39)  Authored  Last Updated: 15-Jan-13 16:39 by Riki AltesStoioff, Murray Guzzetta C (MD)

## 2014-06-08 NOTE — Consult Note (Signed)
PATIENT NAME:  Spencer Edwards, [name] MR#:  [id number] DATE OF BIRTH:  [birth year]  DATE OF CONSULTATION:  01/06/2014  REFERRING PHYSICIAN:  Dr. Allena KatzPatel CONSULTING PHYSICIAN:  Stann Mainlandavid P. Sampson GoonFitzgerald, MD  REASON FOR CONSULTATION: Recurrent fevers and sepsis.   HISTORY OF PRESENT ILLNESS: This is a 74 year old gentleman who was admitted November 23 after being found unresponsive at home. He was septic. He had had a history of recent hospitalization for spontaneous right pneumothorax. He apparently had been declining at home recently. The patient was initially quite hypertensive, but then his pressures bottomed out. He was intubated as his mental status worsened. He was in acute renal failure, as well as had positive troponins. The patient has since been found to have bacteremia from a urinary source. He initially improved, but remains intubated and he has had recurrent fevers. We are consulted for further management.   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   PAST SURGICAL HISTORY: Right-sided AKA, small bowel obstruction status post hemicolectomy.   SOCIAL HISTORY: He apparently has quit smoking and drinking. He lives alone.   FAMILY HISTORY: Noncontributory.   ALLERGIES: No known drug allergies.   REVIEW OF SYSTEMS: Unable to be obtained as the patient is intubated.   ANTIBIOTICS SINCE ADMISSION: Include fluconazole given November 21 through November 23, levofloxacin November 21 through November 23, Zosyn November 21 through November 25.   PHYSICAL EXAMINATION: VITAL SIGNS: Temperature 98.7, pulse 64, blood pressure 122/73, respirations 11, sat 98% on a ventilator. His T-max over the last 24 hours has been 38.6. It appears  his fevers had initially decreased but then recurred starting approximately November 24.  GENERAL: He is intubated, critically ill appearing.  HEENT: Pupils are reactive. Sclerae anicteric. He has conjunctival hemorrhage on his right lateral conjunctiva. ET tube in place and OG tube in place.  NECK: Supple.  HEART: Regular.  LUNGS: Coarse breath sounds bilaterally.  ABDOMEN: Soft, nontender, nondistended. No hepatosplenomegaly noted.  EXTREMITIES: He has a right BKA which is well healed. His left foot has no edema but is quite cool and has decreased capillary refill.  SKIN: He has a scab over the dorsum of his left foot, as well as a blister of his left second toe dorsum.  NEUROLOGIC: He is intubated and sedated.   LABORATORY DATA: White blood count on admission was 15.9, peaked at 24.2, and is currently 12.2, hemoglobin 13.9, platelets 136,000. Renal function on admission showed a creatinine of 5.06, currently down to 2.1, BUN 80. Liver tests on admission showed AST 318 down to 91, ALT 97 down to 64, alkaline phosphatase 164 down to 70, total bilirubin initially 2.1 down to 1.7, albumin quite low at 1.8. Troponin was markedly positive at 29. TSH was normal. Urine toxicology screen was negative. Urinalysis on admission showed 2128 white cells. Urine culture grew Proteus mirabilis that was sensitive to levofloxacin, imipenem, ciprofloxacin, ceftriaxone, ampicillin, and cefazolin, as well as Bactrim. Blood cultures 2/2 grew Proteus as well. Sputum culture November 20 grew only candida. Repeat blood cultures November 25 negative. Repeat urine culture November 25 negative.   IMAGING: Chest x-ray November 25 showed stable apical pneumothorax, small bilateral pleural effusions, and stable bibasilar atelectasis. CT abdomen and pelvis November 19 revealed no acute findings, status post right hemicolectomy, stable pneumothorax.   IMPRESSION:  A 74 year old gentleman with multiple medical problems admitted with  sepsis, elevated white count, hypotension, proteus urinary tract infection, and sepsis from bacteremia with Proteus. He had acute renal failure with a creatinine of 5. Clinically he had improved, but has developed new fevers. He does have a central line in place. Chest x-ray does not show any pneumonia and there is no change in his respiratory status. He is not having any diarrhea. He has been covered with IV Zosyn and levofloxacin. His white count has stabilized.   It is unclear what is causing his new fevers. He is certainly at risk for line infection. I am also concerned if this is peripheral vascular disease with his left foot being cool with poor capillary refill.   RECOMMENDATIONS: 1.  Discontinue Zosyn which can cause beta-lactam fever. Would switch to ceftriaxone at this point and thus will provide good coverage of the Proteus.  2.  Consult vascular for evaluation of the left foot.  3.  If he develops worsening fevers or becomes unstable, I would add vancomycin since he has a line in place, although blood cultures are pending and are no growth to date.   Thank you for the consultation. I will be glad to follow with you.    ____________________________ Stann Mainland. Sampson Goon, MD dpf:at D: 01/06/2014 10:01:34 ET T: 01/06/2014 13:25:58 ET JOB#: [id number]  cc: Stann Mainland. Sampson Goon, MD, <Dictator> Darby Fleeman Sampson Goon MD ELECTRONICALLY SIGNED 02/13/2014 21:19

## 2014-06-08 NOTE — H&P (Signed)
PATIENT NAME:  Spencer Edwards, Spencer Edwards MR#:  [id number] DATE OF BIRTH:  [birth year]  DATE OF ADMISSION:  12/30/2013  PRIMARY CARE PHYSICIAN: Unknown.     HISTORY OF PRESENT ILLNESS: The patient is a 74 year old Caucasian male with history of diabetes mellitus, history of recent right spontaneous pneumothorax status post tube thoracostomy in 07/2013 by Dr. Thelma Barge, and other multiple medical problems, who presents to the hospital poorly responsive. Apparently, the patient was complaining of some lower extremity pain yesterday and he did not go even to church according to nursing staff who was present during my interview.  Today he was visited by [employer] and he was found to be poorly responsive.  He was sitting slumped in urine.  Initial blood pressure was found to be markedly elevated at 247/190.  His oxygenation remained satisfactory.  He was given labetalol however, after labetalol his blood pressure suddenly plummeted down to the 60s.  He required 3 or 4 liters of IV fluid administration as well as levophed initiation through peripheral line to stabilize him.  He was at the same time, intubated since his mental status worsened even more.  Essential line was placed into right IJ and the patient was sedated with Versed.  His labs revealed renal failure, metabolic acidosis as well as liver abnormalities, and markedly elevated troponin of 29.0.  Dr. Kirke Corin was consulted however, Dr. Kirke Corin, do not feel that the patient is a candidate for any intervention at present since he had gross hematuria; however, aspirin therapy was recommended by Dr. Kirke Corin.  Hospitalist services were contacted for admission. The patient himself is intubated and not able to provide any review of systems.   PAST MEDICAL HISTORY:  Significant for history of diabetes mellitus, last hemoglobin A1c was found on the computer to be 9.9 in 07/2013.  History of right spontaneous pneumothorax status post tube thoracostomy by Dr. Thelma Barge in 07/2013,  history of urinary retention with gross hematuria, followed and treated by Dr. 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   PAST SURGICAL HISTORY:  Right-sided DKA, small bowel obstruction status post surgery.  SOCIAL HISTORY:  According to medical records, he quit smoking, drinking alcohol a long time ago.  He denied any drug abuse, according to medical records.   FAMILY HISTORY: The patient's mother had diabetes. No cancer in the family.   ALLERGIES:  None.    MEDICATIONS:   According to the medical records, the patient was on Lantus in the past at 24 units subcutaneously; also Colace, aspirin and Advair and multivitamins.  His medication list now is complete.  According to medical records and it has only acetaminophen hydrocodone 325/5 mg 1 tablet every 4 hours as needed, tamsulosin 0.4 mg once daily dose.  It is unclear if he is still taking these medications; however. Review of systems not available.   On arrival to the hospital, the patient's:  VITAL SIGNS:  Temperature was not measured. Pulse was 124, respiration rate was 40,  blood pressure 199/177, saturation was 100% on oxygen therapy.  GENERAL: This is a well-developed, nourished, obese Caucasian male in moderate to severe respiratory distress. He is intubated and still has disconcordant  movements in his chest and abdomen.   HEENT: His pupils are equal 2 mm and reported reactive to light. Extraocular movements intact. No icterus or conjunctivitis. I am not able  to assess his hearing.  No pharyngeal erythema. Mucosa is dry. ET tube is in place.  NECK:  Did not reveal masses. Supple, nontender. Thyroid is not enlarged. No adenopathy. No JVD or carotid bruits bilaterally. Full range of motion. The patient does have right IJ placed.  LUNGS:  Diminished breath sounds bilaterally, clear to auscultation as well a few rhonchi were heard and a few crackles scattered around bilaterally. The patient does have labored inspirations, as well as increased effort to breathe. He is in moderate respiratory distress despite him being intubated.  CARDIOVASCULAR: S1, S2 appreciated, regular. PMI not lateralized. Chest is nontender to palpation, 1+ pedal pulses, diminished left lower extremity peripheral pulses. Trace to 1+ lower extremity edema on the left.  No calf tenderness or cyanosis was noted in lower extremity. The patient does have a chronic skin changes in his lower extremity as well as his foot some scaling and erosions. He does have cyanosis of his left upper extremity; however.  ABDOMEN: Protuberant, soft no significant tenderness was noted during my evaluation. He does have hepatosplenomegaly. Bowel sounds are present but diminished.  RECTAL: Deferred.  The patient is external genitalia is normal, no lesions were noted. The patient does have a very thick and bloody urine coming out, very little amount 20 to 50 mL so far.  MUSCLE STRENGTH: No movement. The patient is cyanotic. His upper extremities were cyanotic. No kyphosis was noted.  SKIN: Denies any rash, lesions, erythema, nodularity, or induration. The patient does have a scar in his right lower extremity, anterior thigh, which is well-healed at this time.  LYMPHATIC: No adenopathy in the cervical region.  NEUROLOGIC: Difficult to obtain and evaluate since he is poorly responsive and sedated and not able to assess his sensory.  He is nonverbal.  He alert, opened his eyes, but does not look around, his not cooperative.     LABORATORY DATA:  BMP done on 12/30/2013, showed a glucose level of 189, BUN and creatinine were 67 and 5.06, bicarbonate level was 18, estimated GFR for non-African American would be 12 and iron gap was 17.  Alcohol level was less than 3.  Liver enzymes marked abnormalities,  albumin level of 2.9, total bilirubin 2.2, alkaline phosphatase 164, AST 318, ALT 97, MB fraction of CK-MB fraction was 26.3. Troponin was 29.0.  White blood cell count is elevated to 15.9, hemoglobin 14.8, hematocrit count 91. Coagulation panel: Pro time 17.6, INR 1.5. Urinalysis is red cloudy urine, specific gravity 1.016.  Red blood cell count 2808, white blood cell count 2128 with 2+ bacteria, no epithelial cells. White blood cell clumps were also noted. ABGs were performed that showed pH of 7.29, pCO2 of 29, pCO2 of 82, saturation 95.8% on the ventilator, tidal volume of 500, assist-control, PEEP of 5 and mechanical rate of 18.  Lactic acid level was venous blood at 5.1.     The patient's EKG showed sinus tachycardia at 120 beats a minute. ST-T depressions in the lateral leads, as well as inferior leads.  No acute ST-T changes, however, were noted.   RADIOLOGIC STUDIES: Chest x-ray, portable single view, 12/30/2013, at 12:56 showed stable mild cardiomegaly. No active lung disease. A KUB 12/30/2013 at 1423 hours no evidence of bowel obstruction. Chest portable single view, 12/30/2013 at 1423 hours, support lines and tubes in appropriate position, no evidence of pneumothorax or other acute findings.  Stable cardiomegaly.  CT scan of the head without contrast, 12/30/2013, no acute intracranial findings, stable cerebral atrophy.  CT of abdomen and pelvis without contrast 12/30/2013, showed no acute findings in the abdomen and pelvis. No evidence of bowel obstruction or infection. The patient is status post right hemicolectomy without complication, Foley catheter in the bladder, trace residual pneumothorax at right lung base. No partial pneumothorax. No comparison radiograph bibasilar atelectasis.   ASSESSMENT AND PLAN:  1.  Acute renal failure.  Admit the patient to the medical floor. Continue him on IV fluids and follow patient's urinary output. We will get nephrology consultation. We will follow kidney  function in the morning. 2.   Shock, questionable septic, we will continue IV fluids as well as start antibiotic therapy. We will get blood cultures, sputum cultures as well as urine cultures checked.  We will follow results. We will continue Levophed for now, keeping MAP 65 and above.  3.  Acute non-Q-wave myocardial infarction questionable demand ischemia. We will start the patient on rectal aspirin and we will ask cardiologist, to see the patient in consultation. We will get echocardiogram done as well.  4.  Metabolic acidosis, likely due to renal failure. Unlikely ketoacidosis. Will initiate the patient on bicarbonate drip will repeat ABGs later today.  5.  Diabetes mellitus type 2, uncontrolled with prior hemoglobin A1c of 9.9. Will check hemoglobin A1c again.  We will continue sliding scale insulin for now. The patient may need IV insulin depending on his glucose level.  6.  Elevated transaminases.  Questionable alcoholic hepatitis.  We will start the patient on steroids.  We will follow liver tests. We will get ammonia level. 7. Altered mental status, questionable metabolic encephalopathy. Get ammonia level checked; supportive therapy will be given for now. We will follow the patient's mental status.  8.  Acute respiratory failure. We will continue steroids and inhalation therapy nebulizers. We will ask pulmonary consultation.  9.  Treat gross hematuria.  We will get a urology consult. We will get urine cultures as well.   TIME SPENT: 2 hours.     ____________________________ Katharina Caper, MD rv:DT D: 12/30/2013 16:10:34 ET T: 12/30/2013 17:49:11 ET JOB#: [id number]  cc: Katharina Caper, MD, <Dictator> Leeba Barbe MD ELECTRONICALLY SIGNED 02/14/2014 21:04

## 2018-08-20 ENCOUNTER — Other Ambulatory Visit: Payer: Self-pay

## 2018-08-20 ENCOUNTER — Emergency Department: Payer: Medicare Other

## 2018-08-20 ENCOUNTER — Inpatient Hospital Stay
Admission: EM | Admit: 2018-08-20 | Discharge: 2018-08-31 | DRG: 853 | Disposition: A | Discharge status: Skilled Nursing Facility | Payer: Medicare Other | Attending: Internal Medicine | Admitting: Internal Medicine

## 2018-08-20 DIAGNOSIS — I248 Other forms of acute ischemic heart disease: Secondary | ICD-10-CM | POA: Diagnosis present

## 2018-08-20 DIAGNOSIS — I639 Cerebral infarction, unspecified: Secondary | ICD-10-CM

## 2018-08-20 DIAGNOSIS — L089 Local infection of the skin and subcutaneous tissue, unspecified: Secondary | ICD-10-CM | POA: Diagnosis not present

## 2018-08-20 DIAGNOSIS — G9341 Metabolic encephalopathy: Secondary | ICD-10-CM | POA: Diagnosis present

## 2018-08-20 DIAGNOSIS — L89150 Pressure ulcer of sacral region, unstageable: Secondary | ICD-10-CM | POA: Diagnosis present

## 2018-08-20 DIAGNOSIS — K922 Gastrointestinal hemorrhage, unspecified: Secondary | ICD-10-CM | POA: Diagnosis not present

## 2018-08-20 DIAGNOSIS — J449 Chronic obstructive pulmonary disease, unspecified: Secondary | ICD-10-CM | POA: Diagnosis present

## 2018-08-20 DIAGNOSIS — Z0189 Encounter for other specified special examinations: Secondary | ICD-10-CM

## 2018-08-20 DIAGNOSIS — Z1159 Encounter for screening for other viral diseases: Secondary | ICD-10-CM

## 2018-08-20 DIAGNOSIS — E1169 Type 2 diabetes mellitus with other specified complication: Secondary | ICD-10-CM | POA: Diagnosis present

## 2018-08-20 DIAGNOSIS — Z794 Long term (current) use of insulin: Secondary | ICD-10-CM | POA: Diagnosis not present

## 2018-08-20 DIAGNOSIS — Z9049 Acquired absence of other specified parts of digestive tract: Secondary | ICD-10-CM

## 2018-08-20 DIAGNOSIS — Z89611 Acquired absence of right leg above knee: Secondary | ICD-10-CM | POA: Diagnosis not present

## 2018-08-20 DIAGNOSIS — Y92003 Bedroom of unspecified non-institutional (private) residence as the place of occurrence of the external cause: Secondary | ICD-10-CM

## 2018-08-20 DIAGNOSIS — R778 Other specified abnormalities of plasma proteins: Secondary | ICD-10-CM

## 2018-08-20 DIAGNOSIS — R7881 Bacteremia: Secondary | ICD-10-CM | POA: Diagnosis not present

## 2018-08-20 DIAGNOSIS — Z7982 Long term (current) use of aspirin: Secondary | ICD-10-CM | POA: Diagnosis not present

## 2018-08-20 DIAGNOSIS — E1152 Type 2 diabetes mellitus with diabetic peripheral angiopathy with gangrene: Secondary | ICD-10-CM | POA: Diagnosis not present

## 2018-08-20 DIAGNOSIS — W06XXXA Fall from bed, initial encounter: Secondary | ICD-10-CM | POA: Diagnosis present

## 2018-08-20 DIAGNOSIS — I10 Essential (primary) hypertension: Secondary | ICD-10-CM | POA: Diagnosis not present

## 2018-08-20 DIAGNOSIS — Z515 Encounter for palliative care: Secondary | ICD-10-CM | POA: Diagnosis not present

## 2018-08-20 DIAGNOSIS — E1122 Type 2 diabetes mellitus with diabetic chronic kidney disease: Secondary | ICD-10-CM | POA: Diagnosis present

## 2018-08-20 DIAGNOSIS — I2510 Atherosclerotic heart disease of native coronary artery without angina pectoris: Secondary | ICD-10-CM | POA: Diagnosis present

## 2018-08-20 DIAGNOSIS — K921 Melena: Secondary | ICD-10-CM | POA: Diagnosis present

## 2018-08-20 DIAGNOSIS — N400 Enlarged prostate without lower urinary tract symptoms: Secondary | ICD-10-CM | POA: Diagnosis present

## 2018-08-20 DIAGNOSIS — M869 Osteomyelitis, unspecified: Secondary | ICD-10-CM | POA: Diagnosis present

## 2018-08-20 DIAGNOSIS — N179 Acute kidney failure, unspecified: Secondary | ICD-10-CM | POA: Diagnosis not present

## 2018-08-20 DIAGNOSIS — E872 Acidosis: Secondary | ICD-10-CM | POA: Diagnosis present

## 2018-08-20 DIAGNOSIS — Z66 Do not resuscitate: Secondary | ICD-10-CM | POA: Diagnosis present

## 2018-08-20 DIAGNOSIS — I129 Hypertensive chronic kidney disease with stage 1 through stage 4 chronic kidney disease, or unspecified chronic kidney disease: Secondary | ICD-10-CM | POA: Diagnosis present

## 2018-08-20 DIAGNOSIS — E11628 Type 2 diabetes mellitus with other skin complications: Secondary | ICD-10-CM

## 2018-08-20 DIAGNOSIS — Z79899 Other long term (current) drug therapy: Secondary | ICD-10-CM | POA: Diagnosis not present

## 2018-08-20 DIAGNOSIS — E870 Hyperosmolality and hypernatremia: Secondary | ICD-10-CM | POA: Diagnosis present

## 2018-08-20 DIAGNOSIS — E1165 Type 2 diabetes mellitus with hyperglycemia: Secondary | ICD-10-CM | POA: Diagnosis present

## 2018-08-20 DIAGNOSIS — T796XXA Traumatic ischemia of muscle, initial encounter: Secondary | ICD-10-CM | POA: Diagnosis not present

## 2018-08-20 DIAGNOSIS — I252 Old myocardial infarction: Secondary | ICD-10-CM

## 2018-08-20 DIAGNOSIS — A419 Sepsis, unspecified organism: Secondary | ICD-10-CM

## 2018-08-20 DIAGNOSIS — M79662 Pain in left lower leg: Secondary | ICD-10-CM | POA: Diagnosis present

## 2018-08-20 DIAGNOSIS — Z7902 Long term (current) use of antithrombotics/antiplatelets: Secondary | ICD-10-CM

## 2018-08-20 DIAGNOSIS — L97529 Non-pressure chronic ulcer of other part of left foot with unspecified severity: Secondary | ICD-10-CM | POA: Diagnosis not present

## 2018-08-20 DIAGNOSIS — R410 Disorientation, unspecified: Secondary | ICD-10-CM | POA: Diagnosis not present

## 2018-08-20 DIAGNOSIS — R6520 Severe sepsis without septic shock: Secondary | ICD-10-CM | POA: Diagnosis present

## 2018-08-20 DIAGNOSIS — L89153 Pressure ulcer of sacral region, stage 3: Secondary | ICD-10-CM | POA: Diagnosis not present

## 2018-08-20 DIAGNOSIS — L8990 Pressure ulcer of unspecified site, unspecified stage: Secondary | ICD-10-CM | POA: Insufficient documentation

## 2018-08-20 DIAGNOSIS — E785 Hyperlipidemia, unspecified: Secondary | ICD-10-CM | POA: Diagnosis present

## 2018-08-20 DIAGNOSIS — D72829 Elevated white blood cell count, unspecified: Secondary | ICD-10-CM | POA: Diagnosis not present

## 2018-08-20 DIAGNOSIS — B957 Other staphylococcus as the cause of diseases classified elsewhere: Secondary | ICD-10-CM | POA: Diagnosis not present

## 2018-08-20 DIAGNOSIS — Z7951 Long term (current) use of inhaled steroids: Secondary | ICD-10-CM | POA: Diagnosis not present

## 2018-08-20 DIAGNOSIS — M6282 Rhabdomyolysis: Secondary | ICD-10-CM | POA: Diagnosis present

## 2018-08-20 DIAGNOSIS — B9689 Other specified bacterial agents as the cause of diseases classified elsewhere: Secondary | ICD-10-CM | POA: Diagnosis not present

## 2018-08-20 DIAGNOSIS — N189 Chronic kidney disease, unspecified: Secondary | ICD-10-CM | POA: Diagnosis present

## 2018-08-20 DIAGNOSIS — W19XXXA Unspecified fall, initial encounter: Secondary | ICD-10-CM | POA: Diagnosis not present

## 2018-08-20 DIAGNOSIS — Z993 Dependence on wheelchair: Secondary | ICD-10-CM

## 2018-08-20 DIAGNOSIS — L97229 Non-pressure chronic ulcer of left calf with unspecified severity: Secondary | ICD-10-CM | POA: Diagnosis not present

## 2018-08-20 DIAGNOSIS — I96 Gangrene, not elsewhere classified: Secondary | ICD-10-CM | POA: Diagnosis not present

## 2018-08-20 DIAGNOSIS — Z1389 Encounter for screening for other disorder: Secondary | ICD-10-CM

## 2018-08-20 DIAGNOSIS — R7989 Other specified abnormal findings of blood chemistry: Secondary | ICD-10-CM

## 2018-08-20 DIAGNOSIS — L89109 Pressure ulcer of unspecified part of back, unspecified stage: Secondary | ICD-10-CM | POA: Diagnosis not present

## 2018-08-20 DIAGNOSIS — Z87891 Personal history of nicotine dependence: Secondary | ICD-10-CM | POA: Diagnosis not present

## 2018-08-20 DIAGNOSIS — B9561 Methicillin susceptible Staphylococcus aureus infection as the cause of diseases classified elsewhere: Secondary | ICD-10-CM | POA: Diagnosis not present

## 2018-08-20 DIAGNOSIS — K219 Gastro-esophageal reflux disease without esophagitis: Secondary | ICD-10-CM | POA: Diagnosis present

## 2018-08-20 LAB — COMPREHENSIVE METABOLIC PANEL
ALT: 52 U/L — ABNORMAL HIGH (ref 0–44)
AST: 52 U/L — ABNORMAL HIGH (ref 15–41)
Albumin: 3.2 g/dL — ABNORMAL LOW (ref 3.5–5.0)
Alkaline Phosphatase: 88 U/L (ref 38–126)
Anion gap: 16 — ABNORMAL HIGH (ref 5–15)
BUN: 75 mg/dL — ABNORMAL HIGH (ref 8–23)
CO2: 24 mmol/L (ref 22–32)
Calcium: 9.9 mg/dL (ref 8.9–10.3)
Chloride: 103 mmol/L (ref 98–111)
Creatinine, Ser: 1.5 mg/dL — ABNORMAL HIGH (ref 0.61–1.24)
GFR calc Af Amer: 51 mL/min — ABNORMAL LOW (ref >60)
GFR calc non Af Amer: 44 mL/min — ABNORMAL LOW (ref >60)
Glucose, Bld: 492 mg/dL — ABNORMAL HIGH (ref 70–99)
Potassium: 4.7 mmol/L (ref 3.5–5.1)
Sodium: 143 mmol/L (ref 135–145)
Total Bilirubin: 1.7 mg/dL — ABNORMAL HIGH (ref 0.3–1.2)
Total Protein: 7.6 g/dL (ref 6.5–8.1)

## 2018-08-20 LAB — URINALYSIS, COMPLETE (UACMP) WITH MICROSCOPIC
Bilirubin Urine: NEGATIVE
Glucose, UA: >=500 mg/dL — AB
Ketones, ur: 20 mg/dL — AB
Nitrite: NEGATIVE
Protein, ur: 100 mg/dL — AB
RBC / HPF: >50 RBC/hpf — ABNORMAL HIGH (ref 0–5)
Specific Gravity, Urine: 1.023 (ref 1.005–1.030)
WBC, UA: >50 WBC/hpf — ABNORMAL HIGH (ref 0–5)
pH: 8 (ref 5.0–8.0)

## 2018-08-20 LAB — CBC WITH DIFFERENTIAL/PLATELET
Abs Immature Granulocytes: 0.1 10*3/uL — ABNORMAL HIGH (ref 0.00–0.07)
Basophils Absolute: 0.1 10*3/uL (ref 0.0–0.1)
Basophils Relative: 0 %
Eosinophils Absolute: 0 10*3/uL (ref 0.0–0.5)
Eosinophils Relative: 0 %
HCT: 53.6 % — ABNORMAL HIGH (ref 39.0–52.0)
Hemoglobin: 17.1 g/dL — ABNORMAL HIGH (ref 13.0–17.0)
Immature Granulocytes: 1 %
Lymphocytes Relative: 6 %
Lymphs Abs: 0.8 10*3/uL (ref 0.7–4.0)
MCH: 27 pg (ref 26.0–34.0)
MCHC: 31.9 g/dL (ref 30.0–36.0)
MCV: 84.5 fL (ref 80.0–100.0)
Monocytes Absolute: 1.3 10*3/uL — ABNORMAL HIGH (ref 0.1–1.0)
Monocytes Relative: 9 %
Neutro Abs: 12.6 10*3/uL — ABNORMAL HIGH (ref 1.7–7.7)
Neutrophils Relative %: 84 %
Platelets: 390 10*3/uL (ref 150–400)
RBC: 6.34 MIL/uL — ABNORMAL HIGH (ref 4.22–5.81)
RDW: 13.4 % (ref 11.5–15.5)
WBC: 14.9 10*3/uL — ABNORMAL HIGH (ref 4.0–10.5)
nRBC: 0 % (ref 0.0–0.2)

## 2018-08-20 LAB — LACTIC ACID, PLASMA
Lactic Acid, Venous: 2.9 mmol/L (ref 0.5–1.9)
Lactic Acid, Venous: 2.3 mmol/L (ref 0.5–1.9)

## 2018-08-20 LAB — CK: Total CK: 1107 U/L — ABNORMAL HIGH (ref 49–397)

## 2018-08-20 LAB — GLUCOSE, CAPILLARY: Glucose-Capillary: 392 mg/dL — ABNORMAL HIGH (ref 70–99)

## 2018-08-20 LAB — TROPONIN I (HIGH SENSITIVITY)
Troponin I (High Sensitivity): 13722 ng/L (ref <18)
Troponin I (High Sensitivity): 11231 ng/L (ref <18)

## 2018-08-20 LAB — PROCALCITONIN: Procalcitonin: 0.78 ng/mL

## 2018-08-20 LAB — SARS CORONAVIRUS 2 BY RT PCR (HOSPITAL ORDER, PERFORMED IN ~~LOC~~ HOSPITAL LAB): SARS Coronavirus 2: NEGATIVE

## 2018-08-20 MED ORDER — ASPIRIN EC 81 MG PO TBEC: 81.0000 mg | DELAYED_RELEASE_TABLET | Freq: Every day | ORAL | Status: DC

## 2018-08-20 MED ORDER — SODIUM CHLORIDE 0.9 % IV BOLUS
1000.0000 mL | Freq: Once | INTRAVENOUS | Status: AC
  Administered 2018-08-20: 1000 mL via INTRAVENOUS

## 2018-08-20 MED ORDER — TAMSULOSIN HCL 0.4 MG PO CAPS
0.4000 mg | ORAL_CAPSULE | Freq: Every day | ORAL | Status: DC
  Administered 2018-08-21 – 2018-08-31 (×9): 0.4 mg via ORAL
  Filled 2018-08-20 (×9): qty 1

## 2018-08-20 MED ORDER — METOPROLOL SUCCINATE ER 25 MG PO TB24
12.5000 mg | ORAL_TABLET | Freq: Every day | ORAL | Status: DC
  Administered 2018-08-21: 12.5 mg via ORAL
  Filled 2018-08-20 (×2): qty 1

## 2018-08-20 MED ORDER — ONDANSETRON HCL 4 MG/2ML IJ SOLN: 4.0000 mg | Freq: Four times a day (QID) | INTRAMUSCULAR | Status: DC | PRN

## 2018-08-20 MED ORDER — INSULIN GLARGINE 100 UNIT/ML ~~LOC~~ SOLN
10.0000 [IU] | Freq: Every day | SUBCUTANEOUS | Status: DC
  Administered 2018-08-20 – 2018-08-21 (×2): 10 [IU] via SUBCUTANEOUS
  Filled 2018-08-20 (×3): qty 0.1

## 2018-08-20 MED ORDER — SODIUM CHLORIDE 0.9 % IV SOLN
8.0000 mg/h | INTRAVENOUS | Status: DC
  Administered 2018-08-20 – 2018-08-22 (×4): 8 mg/h via INTRAVENOUS
  Filled 2018-08-20 (×5): qty 80

## 2018-08-20 MED ORDER — SODIUM CHLORIDE 0.9 % IV SOLN
2.0000 g | Freq: Two times a day (BID) | INTRAVENOUS | Status: DC
  Administered 2018-08-20 – 2018-08-21 (×3): 2 g via INTRAVENOUS
  Filled 2018-08-20 (×5): qty 2

## 2018-08-20 MED ORDER — INSULIN ASPART 100 UNIT/ML ~~LOC~~ SOLN
0.0000 [IU] | Freq: Every day | SUBCUTANEOUS | Status: DC
  Administered 2018-08-20: 5 [IU] via SUBCUTANEOUS
  Administered 2018-08-23: 4 [IU] via SUBCUTANEOUS
  Administered 2018-08-24: 23:00:00 2 [IU] via SUBCUTANEOUS
  Administered 2018-08-25 – 2018-08-26 (×2): 4 [IU] via SUBCUTANEOUS
  Administered 2018-08-27: 3 [IU] via SUBCUTANEOUS
  Administered 2018-08-29: 2 [IU] via SUBCUTANEOUS
  Filled 2018-08-20 (×7): qty 1

## 2018-08-20 MED ORDER — VANCOMYCIN HCL IN DEXTROSE 1-5 GM/200ML-% IV SOLN: 1000.0000 mg | Freq: Once | INTRAVENOUS | Status: DC

## 2018-08-20 MED ORDER — ACETAMINOPHEN 650 MG RE SUPP: 650.0000 mg | Freq: Four times a day (QID) | RECTAL | Status: DC | PRN

## 2018-08-20 MED ORDER — IPRATROPIUM-ALBUTEROL 0.5-2.5 (3) MG/3ML IN SOLN: 3.0000 mL | RESPIRATORY_TRACT | Status: DC | PRN

## 2018-08-20 MED ORDER — FAMOTIDINE 20 MG PO TABS
20.0000 mg | ORAL_TABLET | Freq: Every day | ORAL | Status: DC
  Administered 2018-08-21 – 2018-08-22 (×2): 20 mg via ORAL
  Filled 2018-08-20 (×2): qty 1

## 2018-08-20 MED ORDER — BISACODYL 10 MG RE SUPP
10.0000 mg | RECTAL | Status: DC | PRN
  Administered 2018-08-23: 09:00:00 10 mg via RECTAL
  Filled 2018-08-20: qty 1

## 2018-08-20 MED ORDER — INSULIN ASPART 100 UNIT/ML ~~LOC~~ SOLN
0.0000 [IU] | Freq: Three times a day (TID) | SUBCUTANEOUS | Status: DC
  Administered 2018-08-21: 3 [IU] via SUBCUTANEOUS
  Administered 2018-08-21: 17:00:00 1 [IU] via SUBCUTANEOUS
  Administered 2018-08-21: 08:00:00 7 [IU] via SUBCUTANEOUS
  Administered 2018-08-22: 2 [IU] via SUBCUTANEOUS
  Administered 2018-08-22: 1 [IU] via SUBCUTANEOUS
  Administered 2018-08-22: 09:00:00 2 [IU] via SUBCUTANEOUS
  Administered 2018-08-23: 5 [IU] via SUBCUTANEOUS
  Administered 2018-08-23: 09:00:00 3 [IU] via SUBCUTANEOUS
  Administered 2018-08-23 – 2018-08-24 (×2): 9 [IU] via SUBCUTANEOUS
  Administered 2018-08-24: 3 [IU] via SUBCUTANEOUS
  Administered 2018-08-25: 17:00:00 5 [IU] via SUBCUTANEOUS
  Administered 2018-08-25 (×2): 3 [IU] via SUBCUTANEOUS
  Administered 2018-08-26: 5 [IU] via SUBCUTANEOUS
  Administered 2018-08-26: 7 [IU] via SUBCUTANEOUS
  Administered 2018-08-26: 9 [IU] via SUBCUTANEOUS
  Administered 2018-08-27 – 2018-08-28 (×5): 3 [IU] via SUBCUTANEOUS
  Administered 2018-08-29 – 2018-08-30 (×4): 2 [IU] via SUBCUTANEOUS
  Administered 2018-08-30 (×2): 3 [IU] via SUBCUTANEOUS
  Administered 2018-08-31: 1 [IU] via SUBCUTANEOUS
  Administered 2018-08-31: 5 [IU] via SUBCUTANEOUS
  Administered 2018-08-31: 12:00:00 3 [IU] via SUBCUTANEOUS
  Filled 2018-08-20 (×28): qty 1

## 2018-08-20 MED ORDER — VANCOMYCIN HCL IN DEXTROSE 1-5 GM/200ML-% IV SOLN
1000.0000 mg | INTRAVENOUS | Status: DC
  Administered 2018-08-20: 1000 mg via INTRAVENOUS
  Filled 2018-08-20: qty 200

## 2018-08-20 MED ORDER — VANCOMYCIN HCL IN DEXTROSE 1-5 GM/200ML-% IV SOLN
1000.0000 mg | INTRAVENOUS | Status: DC
  Administered 2018-08-20: 14:00:00 1000 mg via INTRAVENOUS
  Filled 2018-08-20: qty 200

## 2018-08-20 MED ORDER — SODIUM CHLORIDE 0.9 % IV SOLN
2.0000 g | Freq: Once | INTRAVENOUS | Status: AC
  Administered 2018-08-20: 14:00:00 2 g via INTRAVENOUS
  Filled 2018-08-20: qty 2

## 2018-08-20 MED ORDER — ALPRAZOLAM 0.25 MG PO TABS
0.2500 mg | ORAL_TABLET | Freq: Three times a day (TID) | ORAL | Status: DC
  Administered 2018-08-20 – 2018-08-31 (×20): 0.25 mg via ORAL
  Filled 2018-08-20 (×24): qty 1

## 2018-08-20 MED ORDER — INSULIN ASPART 100 UNIT/ML ~~LOC~~ SOLN
5.0000 [IU] | Freq: Once | SUBCUTANEOUS | Status: AC
  Administered 2018-08-20: 5 [IU] via INTRAVENOUS
  Filled 2018-08-20: qty 1

## 2018-08-20 MED ORDER — SODIUM CHLORIDE 0.9 % IV SOLN
Freq: Once | INTRAVENOUS | Status: AC
  Administered 2018-08-20: 18:00:00 via INTRAVENOUS

## 2018-08-20 MED ORDER — OXYCODONE HCL 5 MG PO TABS: 5.0000 mg | ORAL_TABLET | ORAL | Status: DC | PRN

## 2018-08-20 MED ORDER — SODIUM CHLORIDE 0.9 % IV SOLN
INTRAVENOUS | Status: DC
  Administered 2018-08-21 – 2018-08-22 (×3): via INTRAVENOUS

## 2018-08-20 MED ORDER — ONDANSETRON HCL 4 MG PO TABS: 4.0000 mg | ORAL_TABLET | Freq: Four times a day (QID) | ORAL | Status: DC | PRN

## 2018-08-20 MED ORDER — SODIUM CHLORIDE 0.9 % IV SOLN
80.0000 mg | Freq: Once | INTRAVENOUS | Status: AC
  Administered 2018-08-20: 80 mg via INTRAVENOUS
  Filled 2018-08-20: qty 80

## 2018-08-20 MED ORDER — METRONIDAZOLE IN NACL 5-0.79 MG/ML-% IV SOLN
500.0000 mg | Freq: Once | INTRAVENOUS | Status: AC
  Administered 2018-08-20: 500 mg via INTRAVENOUS
  Filled 2018-08-20: qty 100

## 2018-08-20 MED ORDER — ACETAMINOPHEN 325 MG PO TABS
650.0000 mg | ORAL_TABLET | Freq: Four times a day (QID) | ORAL | Status: DC | PRN
  Administered 2018-08-23 (×2): 650 mg via ORAL
  Filled 2018-08-20 (×2): qty 2

## 2018-08-20 NOTE — ED Notes (Signed)
Date and time results received: 08/20/18 1750 (use smartphrase ".now" to insert current time)  Test: lactic Critical Value: 2.3  Name of Provider Notified: Quentin Cornwall  Orders Received? Or Actions Taken?: Orders Received - See Orders for details

## 2018-08-20 NOTE — ED Notes (Signed)
Date and time results received: 08/20/18 1430 (use smartphrase ".now" to insert current time)  Test: lactic Critical Value: 2.9  Name of Provider Notified: Quentin Cornwall  Orders Received? Or Actions Taken?: Orders Received - See Orders for details

## 2018-08-20 NOTE — ED Notes (Addendum)
Friends of pt called to check on pt. History of homehealth and became friends after that. Friends stated that they would help pt and get groceries and had not heard from pt over the weekend. Concerned for pt's safety and living quarters that are not suitable for pt to return to. Friends also state that in the past someone has reported his living quarters and pt had gotten upset.   Herbie Baltimore and Salli Real [phone] and [phone]

## 2018-08-20 NOTE — ED Notes (Signed)
Attempted to call report and informed Charge is not on floor and she would have to call me back. Asked to speak to RN getting pt but informed that it has not yet been assigned. Will inform RN [employer]

## 2018-08-20 NOTE — ED Triage Notes (Addendum)
Pt arrives visa [facility] from home with c/o fall. Pt immediatly taken to decon shower. Pt was covered from head to toe in feces. Pt is alert and oriented. Pt VSS. Pt has right BKA. Pt's left leg and foot are covered in sores, scabs and his foot and toe with foul smell and open wounds. Left big toe is black with pus and 2nd toe looks to be split. Pt's top of left foot is red and sore to touch with wounds noted to be scattered.  Pt has multiple wounds and pressure sores noted to sacrum and upper back. Pt has redness to upper back and states pain when touched.  Pt denies any chest pain, SOB or abdominal pain. Pt states he is just thirsty and keeps thanking everyone for helping him.  EMS states pt had been down on the floor for about a week. Pt states he fell out of bed and was able to drag himself to another room trying to call 911. Pt states he was unable to get to a phone. [employer] came today to bring pt's meal and found him. Pt states he uses a wheelchair to get around.

## 2018-08-20 NOTE — ED Notes (Signed)
Pt cleaned up and repositioned in bed at this time 

## 2018-08-20 NOTE — ED Provider Notes (Signed)
[facility] Emergency Department Provider Note    First MD Initiated Contact with Patient 08/20/18 1233     (approximate)  I have reviewed the triage vital signs and the nursing notes.   HISTORY  Chief Complaint Fall    HPI Spencer Edwards is a 78 y.o. male states a past medical history presents the ER via EMS after being found by [employer] on his home kitchen floor covered in melanotic stool and urine.  Patient reportedly fell out of bed 4 days ago and crawled to the kitchen and tried to call 911 but was unable to stand.  He denies any chest pain.  Feels weak and is complaining of thirst.  EMS took pictures of his house which was an incredible state of disrepair.  APS report was filed.  Patient states he is not taking any of his medications for 4 days.    Past Medical History:  Diagnosis Date  . Acute kidney failure (HCC)   . Anxiety disorder   . Coronary artery disease    Non-ST elevation myocardial infarction in November 2015 in the setting of septic shock and multiorgan failure. Peak troponin was 25. Echo showed normal LV systolic function with inferior and posterior hypokinesis.  . Diabetes mellitus without complication (HCC)   . Dysphagia   . Gastro-esophageal reflux   . Hematuria   . Hyperlipidemia   . Hypernatremia   . Hypertension   . MI (myocardial infarction) (HCC)   . Muscle weakness   . PVD (peripheral vascular disease) (HCC)   . Rhabdomyolysis   . Urinary retention    Family History  Problem Relation Age of Onset  . Heart Problems Father   . Heart disease Father    Past Surgical History:  Procedure Laterality Date  . ABDOMINAL SURGERY    . leg amputation     Patient Active Problem List   Diagnosis Date Noted  . Sepsis (HCC) 08/20/2018  . Coronary artery disease   . Hyperlipidemia   . Hypertension       Prior to Admission medications   Medication Sig Start Date End Date Taking? Authorizing Provider   ALPRAZolam (XANAX) 0.25 MG tablet Take 0.25 mg by mouth 3 (three) times daily.   Yes [provider]  Amino Acids-Protein Hydrolys (FEEDING SUPPLEMENT, PRO-STAT SUGAR FREE 64,) LIQD Take 30 mLs by mouth 2 (two) times daily.   Yes [provider]  aspirin EC 81 MG tablet Take 81 mg by mouth daily.    Yes [provider]  atorvastatin (LIPITOR) 80 MG tablet Take 80 mg by mouth daily.   Yes [provider]  bisacodyl (DULCOLAX) 10 MG suppository Place 10 mg rectally as needed for moderate constipation.   Yes [provider]  clopidogrel (PLAVIX) 75 MG tablet Take 75 mg by mouth daily.   Yes [provider]  famotidine (PEPCID) 20 MG tablet Take 20 mg by mouth daily.   Yes [provider]  insulin glargine (LANTUS) 100 UNIT/ML injection Inject 24 Units into the skin at bedtime.   Yes [provider]  ipratropium-albuterol (DUONEB) 0.5-2.5 (3) MG/3ML SOLN Take 3 mLs by nebulization every 4 (four) hours as needed.   Yes [provider]  metoprolol tartrate (LOPRESSOR) 25 MG tablet Take 12.5 mg by mouth 2 (two) times daily.   Yes [provider]  promethazine (PHENERGAN) 25 MG tablet Take 25 mg by mouth every 8 (eight) hours as needed for nausea or  vomiting.   Yes [provider]  sennosides (SENOKOT) 8.8 MG/5ML syrup Take 5 mLs by mouth every 12 (twelve) hours.   Yes [provider]  sulfamethoxazole-trimethoprim (BACTRIM DS,SEPTRA DS) 800-160 MG per tablet Take 1 tablet by mouth daily.   Yes [provider]  tamsulosin (FLOMAX) 0.4 MG CAPS capsule Take 0.4 mg by mouth daily.   Yes [provider]    Allergies Patient has no known allergies.    Social History Social History   Tobacco Use  . Smoking status: Former Smoker    Types: Cigarettes  . Smokeless tobacco: Never Used  Substance Use Topics  . Alcohol use: No  . Drug use: No    Review of Systems Patient denies  headaches, rhinorrhea, blurry vision, numbness, shortness of breath, chest pain, edema, cough, abdominal pain, nausea, vomiting, diarrhea, dysuria, fevers, rashes or hallucinations unless otherwise stated above in HPI. ____________________________________________   PHYSICAL EXAM:  VITAL SIGNS: Vitals:   08/20/18 1630 08/20/18 1645  BP: 112/66   Pulse: (!) 104   Resp: 14 (!) 27  Temp:    SpO2: 94%     Constitutional: Alert , ill appearing with fould smell iminating from left foot and wound  Eyes: Conjunctivae are normal.  Head: Atraumatic. Nose: No congestion/rhinnorhea. Mouth/Throat: Mucous membranes are dry   Neck: No stridor. Painless ROM.  Cardiovascular: tachycardic rate, regular rhythm. Grossly normal heart sounds.  Good peripheral circulation. Respiratory: Normal respiratory effort.  No retractions. Lungs CTAB. Gastrointestinal: Soft and nontender. No distention. No abdominal bruits. No CVA tenderness. Genitourinary: normal external genitalia, sacral decubitus ulcer Musculoskeletal: left leg with chronic ulceration with gren purulence coming from between toes with open wound of left 2nd toe.  Poor peripheral circulation  Cellulitis extrending up to just below left knee Neurologic:  Normal speech and language. No gross focal neurologic deficits are appreciated. No facial droop Skin:  Skin is warm, dry and intact. No rash noted. Psychiatric: Mood and affect are normal. Speech and behavior are normal.  ____________________________________________   LABS (all labs ordered are listed, but only abnormal results are displayed)  Results for orders placed or performed during the hospital encounter of 08/20/18 (from the past 24 hour(s))  Comprehensive metabolic panel     Status: Abnormal   Collection Time: 08/20/18  1:14 PM  Result Value Ref Range   Sodium 143 135 - 145 mmol/L   Potassium 4.7 3.5 - 5.1 mmol/L   Chloride 103 98 - 111 mmol/L   CO2 24 22 - 32 mmol/L   Glucose,  Bld 492 (H) 70 - 99 mg/dL   BUN 75 (H) 8 - 23 mg/dL   Creatinine, Ser 9.141.50 (H) 0.61 - 1.24 mg/dL   Calcium 9.9 8.9 - 78.210.3 mg/dL   Total Protein 7.6 6.5 - 8.1 g/dL   Albumin 3.2 (L) 3.5 - 5.0 g/dL   AST 52 (H) 15 - 41 U/L   ALT 52 (H) 0 - 44 U/L   Alkaline Phosphatase 88 38 - 126 U/L   Total Bilirubin 1.7 (H) 0.3 - 1.2 mg/dL   GFR calc non Af Amer 44 (L) >60 mL/min   GFR calc Af Amer 51 (L) >60 mL/min   Anion gap 16 (H) 5 - 15  CBC WITH DIFFERENTIAL     Status: Abnormal   Collection Time: 08/20/18  1:14 PM  Result Value Ref Range   WBC 14.9 (H) 4.0 - 10.5 K/uL   RBC 6.34 (H) 4.22 - 5.81 MIL/uL  Hemoglobin 17.1 (H) 13.0 - 17.0 g/dL   HCT 16.153.6 (H) 09.639.0 - 04.552.0 %   MCV 84.5 80.0 - 100.0 fL   MCH 27.0 26.0 - 34.0 pg   MCHC 31.9 30.0 - 36.0 g/dL   RDW 40.913.4 81.111.5 - 91.415.5 %   Platelets 390 150 - 400 K/uL   nRBC 0.0 0.0 - 0.2 %   Neutrophils Relative % 84 %   Neutro Abs 12.6 (H) 1.7 - 7.7 K/uL   Lymphocytes Relative 6 %   Lymphs Abs 0.8 0.7 - 4.0 K/uL   Monocytes Relative 9 %   Monocytes Absolute 1.3 (H) 0.1 - 1.0 K/uL   Eosinophils Relative 0 %   Eosinophils Absolute 0.0 0.0 - 0.5 K/uL   Basophils Relative 0 %   Basophils Absolute 0.1 0.0 - 0.1 K/uL   Immature Granulocytes 1 %   Abs Immature Granulocytes 0.10 (H) 0.00 - 0.07 K/uL  Procalcitonin     Status: None   Collection Time: 08/20/18  1:14 PM  Result Value Ref Range   Procalcitonin 0.78 ng/mL  CK     Status: Abnormal   Collection Time: 08/20/18  1:14 PM  Result Value Ref Range   Total CK 1,107 (H) 49 - 397 U/L  Troponin I (High Sensitivity)     Status: Abnormal   Collection Time: 08/20/18  1:14 PM  Result Value Ref Range   Troponin I (High Sensitivity) 13,722 (HH) <18 ng/L  Urinalysis, Complete w Microscopic     Status: Abnormal   Collection Time: 08/20/18  1:27 PM  Result Value Ref Range   Color, Urine YELLOW (A) YELLOW   APPearance CLOUDY (A) CLEAR   Specific Gravity, Urine 1.023 1.005 - 1.030   pH 8.0 5.0 - 8.0    Glucose, UA >=500 (A) NEGATIVE mg/dL   Hgb urine dipstick LARGE (A) NEGATIVE   Bilirubin Urine NEGATIVE NEGATIVE   Ketones, ur 20 (A) NEGATIVE mg/dL   Protein, ur 782100 (A) NEGATIVE mg/dL   Nitrite NEGATIVE NEGATIVE   Leukocytes,Ua LARGE (A) NEGATIVE   RBC / HPF >50 (H) 0 - 5 RBC/hpf   WBC, UA >50 (H) 0 - 5 WBC/hpf   Bacteria, UA RARE (A) NONE SEEN   Squamous Epithelial / LPF 0-5 0 - 5   WBC Clumps PRESENT    Hyaline Casts, UA PRESENT    Ca Oxalate Crys, UA PRESENT   SARS Coronavirus 2 (CEPHEID- [facility]), Hosp Order     Status: None   Collection Time: 08/20/18  1:36 PM   Specimen: Nasopharyngeal Swab  Result Value Ref Range   SARS Coronavirus 2 NEGATIVE NEGATIVE  Lactic acid, plasma     Status: Abnormal   Collection Time: 08/20/18  3:36 PM  Result Value Ref Range   Lactic Acid, Venous 2.9 (HH) 0.5 - 1.9 mmol/L  Troponin I (High Sensitivity)     Status: Abnormal   Collection Time: 08/20/18  4:49 PM  Result Value Ref Range   Troponin I (High Sensitivity) 11,231 (HH) <18 ng/L   ____________________________________________  EKG My review and personal interpretation at Time: 13:12   Indication: weakness  Rate: 110  Rhythm: sinus Axis: normal Other: diffuse st elevations in inferolateral distribution without reciprocal changes ____________________________________________  RADIOLOGY  I personally reviewed all radiographic images ordered to evaluate for the above acute complaints and reviewed radiology reports and findings.  These findings were personally discussed with the patient.  Please see medical record for radiology  report.  ____________________________________________   PROCEDURES  Procedure(s) performed:  .Critical Care Performed by: Willy Eddyobinson, Hula Tasso, MD Authorized by: Willy Eddyobinson, Elvin Banker, MD   Critical care provider statement:    Critical care time (minutes):  30   Critical care time was exclusive of:  Separately billable procedures  and treating other patients   Critical care was necessary to treat or prevent imminent or life-threatening deterioration of the following conditions:  Sepsis   Critical care was time spent personally by me on the following activities:  Development of treatment plan with patient or surrogate, discussions with consultants, evaluation of patient's response to treatment, examination of patient, obtaining history from patient or surrogate, ordering and performing treatments and interventions, ordering and review of laboratory studies, ordering and review of radiographic studies, pulse oximetry, re-evaluation of patient's condition and review of old charts      Critical Care performed: yes ____________________________________________   INITIAL IMPRESSION / ASSESSMENT AND PLAN / ED COURSE  Pertinent labs & imaging results that were available during my care of the patient were reviewed by me and considered in my medical decision making (see chart for details).   DDX: rhabdo, sepsis, osteo, fracture, electrolyte abn, acs, stemi, pna, gi bleed  Spencer Edwards is a 78 y.o. who presents to the ED with very poor condition as described above with fall with prolonged downtime certainly concerning for rhabdo as well as osteomyelitis and sepsis.  EKG does show diffuse ST changes.  He denies any chest pain or pressure.  Does have known heart disease however my concern is that this is primarily sepsis requiring IV resuscitation IV antibiotics and evaluation of electrolyte abnormality as I do not see any reciprocal changes.  Patient also with grossly melanotic stools therefore there is obviously concern for premature anticoagulation in this undifferentiated patient at this time.  The patient will be placed on continuous pulse oximetry and telemetry for monitoring.  Laboratory evaluation will be sent to evaluate for the above complaints.     Clinical Course as of Aug 20 1750  Thu Aug 20, 2018  1432 Potassium  is normal but patient does have significant uremia likely consistent with his melena and upper GI bleed.  Will be started on IV Protonix.  Does have lactic acidosis giving IV fluids as well as IV antibiotics.will consult cardiology.    [PR]  1504 Discussed case in consultation regarding ekg and severely elevated troponin with Dr. Jeanene Erballwod cardiology given concern for rhabdo history of CAD, probable sepsis.  Given lack of reciprocal changes and lack of chest pain and pressure in the setting of sepsis with being found down for 4 days and concern for acute GI bleed will manage conservatively at this time.  We will continue with IV fluid resuscitation patient will be admitted to the hospital.  Do fear anticoagulating this patient at this time as he remains still undifferentiated with concerning evidence of upper GI bleed.  He is hemodynamically stable and he is chest pain-free at this time therefore this might be demand ischemia in the setting of AKI and rhabdo.   [PR]    Clinical Course User Index [PR] Willy Eddyobinson, Fareeha Evon, MD    The patient was evaluated in Emergency Department today for the symptoms described in the history of present illness. He/she was evaluated in the context of the global COVID-19 pandemic, which necessitated consideration that the patient might be at risk for infection with the SARS-CoV-2 virus that causes COVID-19. Institutional protocols and algorithms that pertain to the  evaluation of patients at risk for COVID-19 are in a state of rapid change based on information released by regulatory bodies including the CDC and federal and state organizations. These policies and algorithms were followed during the patient's care in the ED.  As part of my medical decision making, I reviewed the following data within the [place] notes reviewed and incorporated, Labs reviewed, notes from prior ED visits and [place] Controlled Substance Database    ____________________________________________   FINAL CLINICAL IMPRESSION(S) / ED DIAGNOSES  Final diagnoses:  Non-traumatic rhabdomyolysis  Sepsis with acute organ dysfunction without septic shock, due to unspecified organism, unspecified type ([place])  Diabetic foot infection ([place])  Elevated troponin I level      NEW MEDICATIONS STARTED DURING THIS VISIT:  New Prescriptions   No medications on file     Note:  This document was prepared using Dragon voice recognition software and may include unintentional dictation errors.    Merlyn Lot, MD 08/20/18 [phone]

## 2018-08-20 NOTE — ED Notes (Signed)
ED TO INPATIENT HANDOFF REPORT  ED Nurse Name Sam and Phone #: [phone]  S Name/Age/Gender Spencer Edwards 78 y.o. male Room/Bed: ED12A/ED12A  Code Status   Code Status: DNR  Home/SNF/Other Home Patient oriented to: self, place, time and situation Is this baseline? Yes   Triage Complete: Triage complete  Chief Complaint fall  Triage Note Pt arrives visa [facility] from home with c/o fall. Pt immediatly taken to decon shower. Pt was covered from head to toe in feces. Pt is alert and oriented. Pt VSS. Pt has right BKA. Pt's left leg and foot are covered in sores, scabs and his foot and toe with foul smell and open wounds. Left big toe is black with pus and 2nd toe looks to be split. Pt's top of left foot is red and sore to touch with wounds noted to be scattered.  Pt has multiple wounds and pressure sores noted to sacrum and upper back. Pt has redness to upper back and states pain when touched.  Pt denies any chest pain, SOB or abdominal pain. Pt states he is just thirsty and keeps thanking everyone for helping him.  EMS states pt had been down on the floor for about a week. Pt states he fell out of bed and was able to drag himself to another room trying to call 911. Pt states he was unable to get to a phone. [employer] came today to bring pt's meal and found him. Pt states he uses a wheelchair to get around.   Allergies No Known Allergies  Level of Care/Admitting Diagnosis ED Disposition    ED Disposition Condition Comment   Admit  [facility] [[id number]]  Level of Care: Med-Surg [16]  Covid Evaluation: Confirmed COVID Negative  Diagnosis: Sepsis [facility](HCC) [[id number]][[id number]]  Admitting Physician: Alford HighlandWIETING, Brinley [[id number]]  Attending Physician: Alford HighlandWIETING, Tadashi [phone][[id number]]  Estimated length of stay: past midnight tomorrow  Certification:: I certify this patient will need inpatient services for at least 2 midnights  PT Class (Do Not Modify): Inpatient  [101]  PT Acc Code (Do Not Modify): Private [1]       B Medical/Surgery History Past Medical History:  Diagnosis Date  . Acute kidney failure (HCC)   . Anxiety disorder   . Coronary artery disease    Non-ST elevation myocardial infarction in November 2015 in the setting of septic shock and multiorgan failure. Peak troponin was 25. Echo showed normal LV systolic function with inferior and posterior hypokinesis.  . Diabetes mellitus without complication (HCC)   . Dysphagia   . Gastro-esophageal reflux   . Hematuria   . Hyperlipidemia   . Hypernatremia   . Hypertension   . MI (myocardial infarction) (HCC)   . Muscle weakness   . PVD (peripheral vascular disease) (HCC)   . Rhabdomyolysis   . Urinary retention    Past Surgical History:  Procedure Laterality Date  . ABDOMINAL SURGERY    . leg amputation       A IV Location/Drains/Wounds Patient Lines/Drains/Airways Status   Active Line/Drains/Airways    Name:   Placement date:   Placement time:   Site:   Days:   Peripheral IV 08/20/18 Right Arm   08/20/18    1328    Arm   less than 1   Peripheral IV 08/20/18 Left Hand   08/20/18    1550    Hand   less than 1          Intake/Output Last  24 hours No intake or output data in the 24 hours ending 08/20/18 1732  Labs/Imaging Results for orders placed or performed during the hospital encounter of 08/20/18 (from the past 48 hour(s))  Comprehensive metabolic panel     Status: Abnormal   Collection Time: 08/20/18  1:14 PM  Result Value Ref Range   Sodium 143 135 - 145 mmol/L   Potassium 4.7 3.5 - 5.1 mmol/L   Chloride 103 98 - 111 mmol/L   CO2 24 22 - 32 mmol/L   Glucose, Bld 492 (H) 70 - 99 mg/dL   BUN 75 (H) 8 - 23 mg/dL   Creatinine, Ser 9.14 (H) 0.61 - 1.24 mg/dL   Calcium 9.9 8.9 - 78.2 mg/dL   Total Protein 7.6 6.5 - 8.1 g/dL   Albumin 3.2 (L) 3.5 - 5.0 g/dL   AST 52 (H) 15 - 41 U/L   ALT 52 (H) 0 - 44 U/L   Alkaline Phosphatase 88 38 - 126 U/L   Total  Bilirubin 1.7 (H) 0.3 - 1.2 mg/dL   GFR calc non Af Amer 44 (L) >60 mL/min   GFR calc Af Amer 51 (L) >60 mL/min   Anion gap 16 (H) 5 - 15    Comment: Performed at [facility], [address] [place], [place] [address]  CBC WITH DIFFERENTIAL     Status: Abnormal   Collection Time: 08/20/18  1:14 PM  Result Value Ref Range   WBC 14.9 (H) 4.0 - 10.5 K/uL   RBC 6.34 (H) 4.22 - 5.81 MIL/uL   Hemoglobin 17.1 (H) 13.0 - 17.0 g/dL   HCT 30.8 (H) 65.7 - 84.6 %   MCV 84.5 80.0 - 100.0 fL   MCH 27.0 26.0 - 34.0 pg   MCHC 31.9 30.0 - 36.0 g/dL   RDW 96.2 95.2 - 84.1 %   Platelets 390 150 - 400 K/uL   nRBC 0.0 0.0 - 0.2 %   Neutrophils Relative % 84 %   Neutro Abs 12.6 (H) 1.7 - 7.7 K/uL   Lymphocytes Relative 6 %   Lymphs Abs 0.8 0.7 - 4.0 K/uL   Monocytes Relative 9 %   Monocytes Absolute 1.3 (H) 0.1 - 1.0 K/uL   Eosinophils Relative 0 %   Eosinophils Absolute 0.0 0.0 - 0.5 K/uL   Basophils Relative 0 %   Basophils Absolute 0.1 0.0 - 0.1 K/uL   Immature Granulocytes 1 %   Abs Immature Granulocytes 0.10 (H) 0.00 - 0.07 K/uL    Comment: Performed at [facility], [address] [place], [place] [address]  Procalcitonin     Status: None   Collection Time: 08/20/18  1:14 PM  Result Value Ref Range   Procalcitonin 0.78 ng/mL    Comment:        Interpretation: PCT > 0.5 ng/mL and <= 2 ng/mL: Systemic infection (sepsis) is possible, but other conditions are known to elevate PCT as well. (NOTE)       Sepsis PCT Algorithm           Lower Respiratory Tract                                      Infection PCT Algorithm    ----------------------------     ----------------------------         PCT < 0.25 ng/mL  PCT < 0.10 ng/mL         Strongly encourage             Strongly discourage   discontinuation of antibiotics    initiation of antibiotics    ----------------------------     -----------------------------       PCT 0.25 - 0.50 ng/mL            PCT 0.10  - 0.25 ng/mL               OR       >80% decrease in PCT            Discourage initiation of                                            antibiotics      Encourage discontinuation           of antibiotics    ----------------------------     -----------------------------         PCT >= 0.50 ng/mL              PCT 0.26 - 0.50 ng/mL                AND       <80% decrease in PCT             Encourage initiation of                                             antibiotics       Encourage continuation           of antibiotics    ----------------------------     -----------------------------        PCT >= 0.50 ng/mL                  PCT > 0.50 ng/mL               AND         increase in PCT                  Strongly encourage                                      initiation of antibiotics    Strongly encourage escalation           of antibiotics                                     -----------------------------                                           PCT <= 0.25 ng/mL                                                 OR                                        >  80% decrease in PCT                                     Discontinue / Do not initiate                                             antibiotics Performed at [facility], [address] [place], [place] [address]   CK     Status: Abnormal   Collection Time: 08/20/18  1:14 PM  Result Value Ref Range   Total CK 1,107 (H) 49 - 397 U/L    Comment: Performed at [facility], [address] [place], [place] [address]  Troponin I (High Sensitivity)     Status: Abnormal   Collection Time: 08/20/18  1:14 PM  Result Value Ref Range   Troponin I (High Sensitivity) 13,722 (HH) <18 ng/L    Comment: CRITICAL RESULT CALLED TO, READ BACK BY AND VERIFIED WITH Isha Seefeld  08/20/18 MJU (NOTE) Elevated high sensitivity troponin I (hsTnI) values and significant  changes across serial measurements may suggest ACS but many  other  chronic and acute conditions are known to elevate hsTnI results.  Refer to the "Links" section for chest pain algorithms and additional  guidance. Performed at [facility], [address] [place], [place] [address]   Urinalysis, Complete w Microscopic     Status: Abnormal   Collection Time: 08/20/18  1:27 PM  Result Value Ref Range   Color, Urine YELLOW (A) YELLOW   APPearance CLOUDY (A) CLEAR   Specific Gravity, Urine 1.023 1.005 - 1.030   pH 8.0 5.0 - 8.0   Glucose, UA >=500 (A) NEGATIVE mg/dL   Hgb urine dipstick LARGE (A) NEGATIVE   Bilirubin Urine NEGATIVE NEGATIVE   Ketones, ur 20 (A) NEGATIVE mg/dL   Protein, ur 914 (A) NEGATIVE mg/dL   Nitrite NEGATIVE NEGATIVE   Leukocytes,Ua LARGE (A) NEGATIVE   RBC / HPF >50 (H) 0 - 5 RBC/hpf   WBC, UA >50 (H) 0 - 5 WBC/hpf   Bacteria, UA RARE (A) NONE SEEN   Squamous Epithelial / LPF 0-5 0 - 5   WBC Clumps PRESENT    Hyaline Casts, UA PRESENT    Ca Oxalate Crys, UA PRESENT     Comment: Performed at [facility], [address] [place] [address], [place], [place] [address]  SARS Coronavirus 2 (CEPHEID- Performed in [facility] hospital lab), Hosp Order     Status: None   Collection Time: 08/20/18  1:36 PM   Specimen: Nasopharyngeal Swab  Result Value Ref Range   SARS Coronavirus 2 NEGATIVE NEGATIVE    Comment: (NOTE) If result is NEGATIVE SARS-CoV-2 target nucleic acids are NOT DETECTED. The SARS-CoV-2 RNA is generally detectable in upper and lower  respiratory specimens during the acute phase of infection. The lowest  concentration of SARS-CoV-2 viral copies this assay can detect is 250  copies / mL. A negative result does not preclude SARS-CoV-2 infection  and should not be used as the sole basis for treatment or other  patient management decisions.  A negative result may occur with  improper specimen collection / handling, submission of specimen other  than nasopharyngeal swab, presence of viral mutation(s) within  the  areas targeted by this assay, and inadequate  number of viral copies  (<250 copies / mL). A negative result must be combined with clinical  observations, patient history, and epidemiological information. If result is POSITIVE SARS-CoV-2 target nucleic acids are DETECTED. The SARS-CoV-2 RNA is generally detectable in upper and lower  respiratory specimens dur ing the acute phase of infection.  Positive  results are indicative of active infection with SARS-CoV-2.  Clinical  correlation with patient history and other diagnostic information is  necessary to determine patient infection status.  Positive results do  not rule out bacterial infection or co-infection with other viruses. If result is PRESUMPTIVE POSTIVE SARS-CoV-2 nucleic acids MAY BE PRESENT.   A presumptive positive result was obtained on the submitted specimen  and confirmed on repeat testing.  While 2019 novel coronavirus  (SARS-CoV-2) nucleic acids may be present in the submitted sample  additional confirmatory testing may be necessary for epidemiological  and / or clinical management purposes  to differentiate between  SARS-CoV-2 and other Sarbecovirus currently known to infect humans.  If clinically indicated additional testing with an alternate test  methodology [phone] is advised. The SARS-CoV-2 RNA is generally  detectable in upper and lower respiratory sp ecimens during the acute  phase of infection. The expected result is Negative. Fact Sheet for Patients:  BoilerBrush.com.cyhttps://www.fda.gov/media/136312/download Fact Sheet for Healthcare Providers: https://pope.com/https://www.fda.gov/media/136313/download This test is not yet approved or cleared by the [place] FDA and has been authorized for detection and/or diagnosis of SARS-CoV-2 by FDA under an Emergency Use Authorization (EUA).  This EUA will remain in effect (meaning this test can be used) for the duration of the COVID-19 declaration under Section 564(b)(1) of the Act, 21  U.S.C. section 360bbb-3(b)(1), unless the authorization is terminated or revoked sooner. Performed at [facility], [address] [place], [place] [address]   Lactic acid, plasma     Status: Abnormal   Collection Time: 08/20/18  3:36 PM  Result Value Ref Range   Lactic Acid, Venous 2.9 (HH) 0.5 - 1.9 mmol/L    Comment: CRITICAL RESULT CALLED TO, READ BACK BY AND VERIFIED WITH [facility] Kaliel Bolds AT 1431 08/20/2018 DAS Performed at [facility], [address] [place], [place] [address]    Ct Head Wo Contrast  Result Date: 08/20/2018 CLINICAL DATA:  Status post fall, found down. Multiple soft tissue wounds in pressure sores. EXAM: CT HEAD WITHOUT CONTRAST TECHNIQUE: Contiguous axial images were obtained from the base of the skull through the vertex without intravenous contrast. COMPARISON:  Head CT dated 12/30/2013. FINDINGS: Brain: Generalized age related parenchymal volume loss with commensurate dilatation of the ventricles and sulci. No mass, hemorrhage, edema or other evidence of acute parenchymal abnormality. No extra-axial hemorrhage. Vascular: No hyperdense vessel or unexpected calcification. Skull: Normal. Negative for fracture or focal lesion. Sinuses/Orbits: No acute finding. Other: None. IMPRESSION: No acute findings in the brain. No evidence of intracranial mass, hemorrhage or edema. Electronically Signed   By: Bary RichardStan  Maynard M.D.   On: 08/20/2018 14:06   Dg Chest Port 1 View  Result Date: 08/20/2018 CLINICAL DATA:  Fall. EXAM: PORTABLE CHEST 1 VIEW COMPARISON:  Radiograph of January 12, 2014. FINDINGS: The heart size and mediastinal contours are within normal limits. Both lungs are clear. No pneumothorax or pleural effusion is noted. The visualized skeletal structures are unremarkable. IMPRESSION: No active disease. Electronically Signed   By: Lupita RaiderJames  Green Jr M.D.   On: 08/20/2018 13:29   Dg Foot Complete Left  Result Date: 08/20/2018 CLINICAL DATA:  Open wound,  concern for fracture/osteomyelitis EXAM: LEFT FOOT - COMPLETE 3+ VIEW COMPARISON:  None. FINDINGS: There is an open wound of the tip of the left great toe with gross bony destruction of the exposed tip of the distal phalanx, consistent with osteomyelitis. There is a single interphalangeal joint of the second digit with dislocation of the distal phalanx. There may be additional bony erosion of the distal aspect of the proximal left fourth phalanx. There is diffuse soft tissue edema about the left foot with additional open wounds about the dorsum of the foot. IMPRESSION: There is an open wound of the tip of the left great toe with gross bony destruction of the exposed tip of the distal phalanx, consistent with osteomyelitis. There is a single interphalangeal joint of the second digit with dislocation of the distal phalanx. There may be additional bony erosion of the distal aspect of the proximal left fourth phalanx. There is diffuse soft tissue edema about the left foot with additional open wounds about the dorsum of the foot. Contrast enhanced MRI may be helpful to evaluate for extent of multifocal osteomyelitis. Electronically Signed   By: Lauralyn PrimesAlex  Bibbey M.D.   On: 08/20/2018 13:56    Pending Labs Unresulted Labs (From admission, onward)    Start     Ordered   08/21/18 0500  Basic metabolic panel  Tomorrow morning,   STAT     08/20/18 1728   08/21/18 0500  CBC  Tomorrow morning,   STAT     08/20/18 1728   08/20/18 1650  Lactic acid, plasma  Once,   R     08/20/18 1650   08/20/18 1236  Blood Culture (routine x 2)  BLOOD CULTURE X 2,   STAT     08/20/18 1236          Vitals/Pain Today's Vitals   08/20/18 1600 08/20/18 1615 08/20/18 1630 08/20/18 1645  BP: 115/68  112/66   Pulse: (!) 102 (!) 102 (!) 104   Resp: (!) 23 (!) 23 14 (!) 27  Temp:      TempSrc:      SpO2: 100% 97% 94%   Weight:      Height:      PainSc:        Isolation Precautions No active  isolations  Medications Medications  vancomycin (VANCOCIN) IVPB 1000 mg/200 mL premix (0 mg Intravenous Stopped 08/20/18 1550)  pantoprazole (PROTONIX) 80 mg in sodium chloride 0.9 % 250 mL (0.32 mg/mL) infusion (8 mg/hr Intravenous New Bag/Given 08/20/18 1731)  aspirin EC tablet 81 mg (has no administration in time range)  metoprolol succinate (TOPROL-XL) 24 hr tablet 12.5 mg (has no administration in time range)  0.9 %  sodium chloride infusion (has no administration in time range)  ALPRAZolam (XANAX) tablet 0.25 mg (has no administration in time range)  insulin glargine (LANTUS) injection 10 Units (has no administration in time range)  bisacodyl (DULCOLAX) suppository 10 mg (has no administration in time range)  famotidine (PEPCID) tablet 20 mg (has no administration in time range)  tamsulosin (FLOMAX) capsule 0.4 mg (has no administration in time range)  ipratropium-albuterol (DUONEB) 0.5-2.5 (3) MG/3ML nebulizer solution 3 mL (has no administration in time range)  acetaminophen (TYLENOL) tablet 650 mg (has no administration in time range)    Or  acetaminophen (TYLENOL) suppository 650 mg (has no administration in time range)  oxyCODONE (Oxy IR/ROXICODONE) immediate release tablet 5 mg (has no administration in time range)  ondansetron (ZOFRAN) tablet 4 mg (has no  administration in time range)    Or  ondansetron [facility]) injection 4 mg (has no administration in time range)  sodium chloride 0.9 % bolus 1,000 mL (0 mLs Intravenous Stopped 08/20/18 1601)  ceFEPIme (MAXIPIME) 2 g in sodium chloride 0.9 % 100 mL IVPB (0 g Intravenous Stopped 08/20/18 1430)  metroNIDAZOLE (FLAGYL) IVPB 500 mg (0 mg Intravenous Stopped 08/20/18 1648)  pantoprazole (PROTONIX) 80 mg in sodium chloride 0.9 % 100 mL IVPB (0 mg Intravenous Stopped 08/20/18 1549)  insulin aspart (novoLOG) injection 5 Units (5 Units Intravenous Given 08/20/18 1439)    Mobility non-ambulatory High fall risk   Focused Assessments Cardiac  Assessment Handoff:  Cardiac Rhythm: Sinus tachycardia Lab Results  Component Value Date   CKTOTAL 1,107 (H) 08/20/2018   CKMB 43.4 (H) 12/30/2013   TROPONINI 21.00 (H) 12/30/2013   No results found for: DDIMER Does the Patient currently have chest pain? No     R Recommendations: See Admitting Provider Note  Report given to:   Additional Notes:

## 2018-08-20 NOTE — ED Notes (Signed)
Recollect green and gray sent to lab

## 2018-08-20 NOTE — ED Notes (Signed)
Attempted to call floor and placed on hold. Had to hang up due to length of time and called back. Informed RN in another room and can't come at this time.

## 2018-08-20 NOTE — Progress Notes (Signed)
Patient ID: Spencer Edwards, male   DOB: Jun 17, 1940, 78 y.o.   MRN: [id number]  ACP time  Patient present  Clinical sepsis with left leg infection, acute myocardial infarction, GI bleed, rhabdomyolysis, type 2 diabetes, acute kidney injury and lactic acidosis  CODE STATUS discussed patient wishes to be a DNR.  Plan.  Overall prognosis is poor.  Get a palliative care consultation.  For clinical sepsis give IV fluids.  Likely will need an amputation at some point.  Consult vascular surgery.  For acute myocardial infarction with GI bleed limited with aspirin only and metoprolol.  No statin because of rhabdomyolysis.  Time spent on ACP discussion 17 minutes Dr Loletha Grayer

## 2018-08-20 NOTE — ED Notes (Signed)
Informed by message by Forestine Chute that pt is being reassigned another room and that they have to wait to get a bed for that room. Stated that they will call when ready. Informed our [place] of current status for pt.

## 2018-08-20 NOTE — H&P (Signed)
[facility] [place] at [facility]   PATIENT NAME: Spencer Edwards    MR#:  [id number]  DATE OF BIRTH:  07/16/1940  DATE OF ADMISSION:  08/20/2018  PRIMARY CARE PHYSICIAN: Patient, No Pcp Per   REQUESTING/REFERRING PHYSICIAN: Dr. Willy EddyPatrick Robinson  CHIEF COMPLAINT:   Chief Complaint  Patient presents with  . Fall    HISTORY OF PRESENT ILLNESS:  Spencer Edwards  is a 78 y.o. male with a known history of diabetes and heart disease presents after falling off his bed and being on the floor for 4 days.  Patient was found in the ER with a foul-smelling left leg with drainage.  Patient's troponin was very elevated.  Patient denies any chest pain or shortness of breath.  Hospitalist services were contacted for further evaluation.  Patient is not the best historian but does answer yes or no questions.  PAST MEDICAL HISTORY:   Past Medical History:  Diagnosis Date  . Acute kidney failure (HCC)   . Anxiety disorder   . Coronary artery disease    Non-ST elevation myocardial infarction in November 2015 in the setting of septic shock and multiorgan failure. Peak troponin was 25. Echo showed normal LV systolic function with inferior and posterior hypokinesis.  . Diabetes mellitus without complication (HCC)   . Dysphagia   . Gastro-esophageal reflux   . Hematuria   . Hyperlipidemia   . Hypernatremia   . Hypertension   . MI (myocardial infarction) (HCC)   . Muscle weakness   . PVD (peripheral vascular disease) (HCC)   . Rhabdomyolysis   . Urinary retention     PAST SURGICAL HISTORY:   Past Surgical History:  Procedure Laterality Date  . ABDOMINAL SURGERY    . leg amputation      SOCIAL HISTORY:   Social History   Tobacco Use  . Smoking status: Former Smoker    Types: Cigarettes  . Smokeless tobacco: Never Used  Substance Use Topics  . Alcohol use: No    FAMILY HISTORY:   Family History  Problem Relation Age of Onset  . Heart Problems Father    . Heart disease Father     DRUG ALLERGIES:  No Known Allergies  REVIEW OF SYSTEMS:  CONSTITUTIONAL: No fever, chills or sweats.  Positive for fatigue.  Patient states that he has some weight gain. EYES: No blurred or double vision.  EARS, NOSE, AND THROAT: No tinnitus or ear pain. No sore throat RESPIRATORY: No cough, shortness of breath, wheezing or hemoptysis.  CARDIOVASCULAR: No chest pain, orthopnea, edema.  GASTROINTESTINAL: No nausea, vomiting, diarrhea or abdominal pain. No blood in bowel movements GENITOURINARY: No dysuria, hematuria.  ENDOCRINE: No polyuria, nocturia,  HEMATOLOGY: No anemia, easy bruising or bleeding SKIN: No rash or lesion. MUSCULOSKELETAL: Some leg pain NEUROLOGIC: No tingling, numbness, weakness.  PSYCHIATRY: No anxiety or depression.   MEDICATIONS AT HOME:   Prior to Admission medications   Medication Sig Start Date End Date Taking? Authorizing Provider  ALPRAZolam (XANAX) 0.25 MG tablet Take 0.25 mg by mouth 3 (three) times daily.   Yes [provider]  Amino Acids-Protein Hydrolys (FEEDING SUPPLEMENT, PRO-STAT SUGAR FREE 64,) LIQD Take 30 mLs by mouth 2 (two) times daily.   Yes [provider]  aspirin EC 81 MG tablet Take 81 mg by mouth daily.    Yes [provider]  atorvastatin (LIPITOR) 80 MG tablet Take 80 mg by mouth daily.   Yes [provider]  bisacodyl (DULCOLAX) 10  MG suppository Place 10 mg rectally as needed for moderate constipation.   Yes [provider]  clopidogrel (PLAVIX) 75 MG tablet Take 75 mg by mouth daily.   Yes [provider]  famotidine (PEPCID) 20 MG tablet Take 20 mg by mouth daily.   Yes [provider]  insulin glargine (LANTUS) 100 UNIT/ML injection Inject 24 Units into the skin at bedtime.   Yes [provider]  ipratropium-albuterol (DUONEB) 0.5-2.5 (3) MG/3ML SOLN Take 3 mLs by nebulization every 4 (four) hours as needed.   Yes [provider]  metoprolol tartrate (LOPRESSOR) 25 MG tablet Take 12.5 mg by mouth 2 (two) times daily.   Yes [provider]  promethazine (PHENERGAN) 25 MG tablet Take 25 mg by mouth every 8 (eight) hours as needed for nausea or vomiting.   Yes [provider]  sennosides (SENOKOT) 8.8 MG/5ML syrup Take 5 mLs by mouth every 12 (twelve) hours.   Yes [provider]  sulfamethoxazole-trimethoprim (BACTRIM DS,SEPTRA DS) 800-160 MG per tablet Take 1 tablet by mouth daily.   Yes [provider]  tamsulosin (FLOMAX) 0.4 MG CAPS capsule Take 0.4 mg by mouth daily.   Yes [provider]      VITAL SIGNS:  Blood pressure 112/66, pulse (!) 104, temperature 98.7 F (37.1 C), temperature source Oral, resp. rate (!) 27, height 5\' 10"  (1.778 m), weight 83 kg, SpO2 94 %.  PHYSICAL EXAMINATION:  GENERAL:  78 y.o.-year-old patient lying in the bed with no acute distress.  EYES: Pupils equal, round, reactive to light and accommodation. No scleral icterus. Extraocular muscles intact.  HEENT: Head atraumatic, normocephalic. Oropharynx and nasopharynx clear.  NECK:  Supple, no jugular venous distention. No thyroid enlargement, no tenderness.  LUNGS: Normal breath sounds bilaterally, no wheezing, rales,rhonchi or crepitation. No use of accessory muscles of respiration.  CARDIOVASCULAR: S1, S2 tachycardic. No murmurs, rubs, or gallops.  ABDOMEN: Soft, nontender, nondistended. Bowel sounds present. No organomegaly or mass.  EXTREMITIES: Prior amputation right leg.  Left leg below. NEUROLOGIC: Cranial nerves II through XII are intact.  Patient able to move his upper extremities PSYCHIATRIC: The patient is alert and oriented x 3.  SKIN:     LABORATORY PANEL:   CBC Recent Labs  Lab 08/20/18 1314  WBC 14.9*  HGB 17.1*  HCT 53.6*  PLT 390   ------------------------------------------------------------------------------------------------------------------   Chemistries  Recent Labs  Lab 08/20/18 1314  NA 143  K 4.7  CL 103  CO2 24  GLUCOSE 492*  BUN 75*  CREATININE 1.50*  CALCIUM 9.9  AST 52*  ALT 52*  ALKPHOS 88  BILITOT 1.7*   ------------------------------------------------------------------------------------------------------------------  High-sensitivity troponin 13,722  RADIOLOGY:  Ct Head Wo Contrast  Result Date: 08/20/2018 CLINICAL DATA:  Status post fall, found down. Multiple soft tissue wounds in pressure sores. EXAM: CT HEAD WITHOUT CONTRAST TECHNIQUE: Contiguous axial images were obtained from the base of the skull through the vertex without intravenous contrast. COMPARISON:  Head CT dated 12/30/2013. FINDINGS: Brain: Generalized age related parenchymal volume loss with commensurate dilatation of the ventricles and sulci. No mass, hemorrhage, edema or other evidence of acute parenchymal abnormality. No extra-axial hemorrhage. Vascular: No hyperdense vessel or unexpected calcification. Skull: Normal. Negative for fracture or focal lesion. Sinuses/Orbits: No acute finding. Other: None. IMPRESSION: No acute findings in the brain. No evidence of intracranial mass, hemorrhage or edema. Electronically Signed   By: Bary RichardStan  Maynard M.D.   On: 08/20/2018 14:06   Dg Chest  Port 1 View  Result Date: 08/20/2018 CLINICAL DATA:  Fall. EXAM: PORTABLE CHEST 1 VIEW COMPARISON:  Radiograph of January 12, 2014. FINDINGS: The heart size and mediastinal contours are within normal limits. Both lungs are clear. No pneumothorax or pleural effusion is noted. The visualized skeletal structures are unremarkable. IMPRESSION: No active disease. Electronically Signed   By: Marijo Conception M.D.   On: 08/20/2018 13:29   Dg Foot Complete Left  Result Date: 08/20/2018 CLINICAL DATA:  Open wound, concern for fracture/osteomyelitis EXAM: LEFT FOOT - COMPLETE 3+ VIEW COMPARISON:  None. FINDINGS: There is an open wound of the tip of the left great toe with gross bony  destruction of the exposed tip of the distal phalanx, consistent with osteomyelitis. There is a single interphalangeal joint of the second digit with dislocation of the distal phalanx. There may be additional bony erosion of the distal aspect of the proximal left fourth phalanx. There is diffuse soft tissue edema about the left foot with additional open wounds about the dorsum of the foot. IMPRESSION: There is an open wound of the tip of the left great toe with gross bony destruction of the exposed tip of the distal phalanx, consistent with osteomyelitis. There is a single interphalangeal joint of the second digit with dislocation of the distal phalanx. There may be additional bony erosion of the distal aspect of the proximal left fourth phalanx. There is diffuse soft tissue edema about the left foot with additional open wounds about the dorsum of the foot. Contrast enhanced MRI may be helpful to evaluate for extent of multifocal osteomyelitis. Electronically Signed   By: Eddie Candle M.D.   On: 08/20/2018 13:56    EKG:   Sinus rhythm with slight ST elevation inferiorly and laterally.  IMPRESSION AND PLAN:   1.  Clinical sepsis with left leg infection which likely will need an amputation at some point.  Empiric antibiotics with vancomycin and Zosyn follow-up cultures. 2.  Acute myocardial infarction with a troponin elevated at 13,722.  Aspirin only with GI bleed.  Dr. Clayborn Bigness consulted.  Echocardiogram.  Continue low-dose metoprolol.  No statin with rhabdomyolysis. 3.  GI bleed.  Serial hemoglobins.  Protonix drip.  Too sick right now to undergo endoscopy. 4.  Rhabdomyolysis hold statin. 5.  Type 2 diabetes mellitus put on insulin and sliding scale 6.  Acute kidney injury.  Monitor with IV fluid hydration. 7.  Lactic acidosis continue to monitor  All the records are reviewed and case discussed with ED provider. Management plans discussed with the patient, and he is in agreement.  CODE STATUS:  DNR  TOTAL TIME TAKING CARE OF THIS PATIENT: 50 minutes.    Loletha Grayer M.D on 08/20/2018 at 5:28 PM  Between 7am to 6pm - Pager - [phone]  After 6pm call admission pager [phone]  [facility] Office  [phone]  CC: Primary care physician; Patient, No Pcp Per

## 2018-08-20 NOTE — ED Notes (Signed)
Spoke with [employer] and they will send someone down to get pt. RN will call if she has any questions regarding pt's chart.

## 2018-08-20 NOTE — ED Notes (Signed)
Pt cleaned up and changed at this time. Pt coughing phelm. Pt suctioned at this time. Pt now resting comfortably

## 2018-08-20 NOTE — ED Notes (Signed)
Called Pharmacy and spoke with Corene Cornea and he is going to send up 2nd bag of protonix.

## 2018-08-20 NOTE — Consult Note (Addendum)
PHARMACY -  BRIEF ANTIBIOTIC NOTE   Pharmacy has received consult(s) for unknown source from an ED provider.  The patient's profile has been reviewed for ht/wt/allergies/indication/available labs.    One time order(s) placed for cefepime and vancomycin. A total loading dose of vancomycin 2000 mg will be given in the ED.   Further antibiotics/pharmacy consults should be ordered by admitting physician if indicated.                       Thank you, Oswald Hillock, PharmD, BCPS  08/20/2018  1:35 PM

## 2018-08-20 NOTE — Consult Note (Signed)
Pharmacy Antibiotic Note  Spencer Edwards is a 78 y.o. male admitted on 08/20/2018 with Wound infection.  Pharmacy has been consulted for cefepime and vancomycin dosing.  Plan: Will continue cefepime 2 g q12H   Pt will received vancomycin 2000 mg total in the ED. Will start vancomycin 1000 mg q24H maintenance dose. Predicted AUC is 433 Goal AUC is 400-550. Plan to order level in 4-5 days.   Height: 5\' 10"  (177.8 cm) Weight: 182 lb 15.7 oz (83 kg) IBW/kg (Calculated) : 73  Temp (24hrs), Avg:98 F (36.7 C), Min:97.3 F (36.3 C), Max:98.7 F (37.1 C)  Recent Labs  Lab 08/20/18 1314 08/20/18 1536  WBC 14.9*  --   CREATININE 1.50*  --   LATICACIDVEN  --  2.9*    Estimated Creatinine Clearance: 41.2 mL/min (A) (by C-G formula based on SCr of 1.5 mg/dL (H)).    No Known Allergies  Antimicrobials this admission: 7/9 cefepime >>  7/9 vancomycin >>   Dose adjustments this admission: None  Microbiology results: 7/9 BCx: pending  Thank you for allowing pharmacy to be a part of this patient's care.  Oswald Hillock, PharmD, BCPS 08/20/2018 5:42 PM

## 2018-08-20 NOTE — ED Notes (Signed)
Repeat EKG performed for MD Quentin Cornwall

## 2018-08-20 NOTE — ED Notes (Signed)
Date and time results received: 08/20/18 1500 (use smartphrase ".now" to insert current time)  Test: troponin Critical Value: 13722  Name of Provider Notified: Quentin Cornwall  Orders Received? Or Actions Taken?: Orders Received - See Orders for details

## 2018-08-21 ENCOUNTER — Other Ambulatory Visit (INDEPENDENT_AMBULATORY_CARE_PROVIDER_SITE_OTHER): Payer: Self-pay | Admitting: Vascular Surgery

## 2018-08-21 ENCOUNTER — Inpatient Hospital Stay
Admit: 2018-08-21 | Discharge: 2018-08-21 | Disposition: A | Discharge status: Home / Self Care | Payer: Medicare Other | Attending: Internal Medicine | Admitting: Internal Medicine

## 2018-08-21 DIAGNOSIS — I96 Gangrene, not elsewhere classified: Secondary | ICD-10-CM

## 2018-08-21 DIAGNOSIS — B9561 Methicillin susceptible Staphylococcus aureus infection as the cause of diseases classified elsewhere: Secondary | ICD-10-CM

## 2018-08-21 DIAGNOSIS — I10 Essential (primary) hypertension: Secondary | ICD-10-CM

## 2018-08-21 DIAGNOSIS — Z515 Encounter for palliative care: Secondary | ICD-10-CM

## 2018-08-21 DIAGNOSIS — T796XXA Traumatic ischemia of muscle, initial encounter: Secondary | ICD-10-CM

## 2018-08-21 DIAGNOSIS — W19XXXA Unspecified fall, initial encounter: Secondary | ICD-10-CM

## 2018-08-21 DIAGNOSIS — E11628 Type 2 diabetes mellitus with other skin complications: Secondary | ICD-10-CM

## 2018-08-21 DIAGNOSIS — K922 Gastrointestinal hemorrhage, unspecified: Secondary | ICD-10-CM

## 2018-08-21 DIAGNOSIS — R7881 Bacteremia: Secondary | ICD-10-CM

## 2018-08-21 DIAGNOSIS — L089 Local infection of the skin and subcutaneous tissue, unspecified: Secondary | ICD-10-CM

## 2018-08-21 DIAGNOSIS — Z9049 Acquired absence of other specified parts of digestive tract: Secondary | ICD-10-CM

## 2018-08-21 DIAGNOSIS — L8990 Pressure ulcer of unspecified site, unspecified stage: Secondary | ICD-10-CM | POA: Insufficient documentation

## 2018-08-21 DIAGNOSIS — E1152 Type 2 diabetes mellitus with diabetic peripheral angiopathy with gangrene: Secondary | ICD-10-CM

## 2018-08-21 DIAGNOSIS — L89109 Pressure ulcer of unspecified part of back, unspecified stage: Secondary | ICD-10-CM

## 2018-08-21 DIAGNOSIS — Z87891 Personal history of nicotine dependence: Secondary | ICD-10-CM

## 2018-08-21 DIAGNOSIS — Z89611 Acquired absence of right leg above knee: Secondary | ICD-10-CM

## 2018-08-21 DIAGNOSIS — L89150 Pressure ulcer of sacral region, unstageable: Secondary | ICD-10-CM

## 2018-08-21 DIAGNOSIS — N179 Acute kidney failure, unspecified: Secondary | ICD-10-CM

## 2018-08-21 LAB — CBC
HCT: 49.5 % (ref 39.0–52.0)
Hemoglobin: 15.2 g/dL (ref 13.0–17.0)
MCH: 27 pg (ref 26.0–34.0)
MCHC: 30.7 g/dL (ref 30.0–36.0)
MCV: 87.9 fL (ref 80.0–100.0)
Platelets: 285 10*3/uL (ref 150–400)
RBC: 5.63 MIL/uL (ref 4.22–5.81)
RDW: 13.6 % (ref 11.5–15.5)
WBC: 12.4 10*3/uL — ABNORMAL HIGH (ref 4.0–10.5)
nRBC: 0 % (ref 0.0–0.2)

## 2018-08-21 LAB — BASIC METABOLIC PANEL
Anion gap: 9 (ref 5–15)
BUN: 56 mg/dL — ABNORMAL HIGH (ref 8–23)
CO2: 22 mmol/L (ref 22–32)
Calcium: 8.8 mg/dL — ABNORMAL LOW (ref 8.9–10.3)
Chloride: 118 mmol/L — ABNORMAL HIGH (ref 98–111)
Creatinine, Ser: 1.16 mg/dL (ref 0.61–1.24)
GFR calc Af Amer: >60 mL/min (ref >60)
GFR calc non Af Amer: 60 mL/min — ABNORMAL LOW (ref >60)
Glucose, Bld: 343 mg/dL — ABNORMAL HIGH (ref 70–99)
Potassium: 4.1 mmol/L (ref 3.5–5.1)
Sodium: 149 mmol/L — ABNORMAL HIGH (ref 135–145)

## 2018-08-21 LAB — BLOOD CULTURE ID PANEL (REFLEXED)
Acinetobacter baumannii: NOT DETECTED
Acinetobacter baumannii: NOT DETECTED
Candida albicans: NOT DETECTED
Candida albicans: NOT DETECTED
Candida glabrata: NOT DETECTED
Candida glabrata: NOT DETECTED
Candida krusei: NOT DETECTED
Candida krusei: NOT DETECTED
Candida parapsilosis: NOT DETECTED
Candida parapsilosis: NOT DETECTED
Candida tropicalis: NOT DETECTED
Candida tropicalis: NOT DETECTED
Enterobacter cloacae complex: NOT DETECTED
Enterobacter cloacae complex: NOT DETECTED
Enterobacteriaceae species: NOT DETECTED
Enterobacteriaceae species: NOT DETECTED
Enterococcus species: NOT DETECTED
Enterococcus species: NOT DETECTED
Escherichia coli: NOT DETECTED
Escherichia coli: NOT DETECTED
Haemophilus influenzae: NOT DETECTED
Haemophilus influenzae: NOT DETECTED
Klebsiella oxytoca: NOT DETECTED
Klebsiella oxytoca: NOT DETECTED
Klebsiella pneumoniae: NOT DETECTED
Klebsiella pneumoniae: NOT DETECTED
Listeria monocytogenes: NOT DETECTED
Listeria monocytogenes: NOT DETECTED
Methicillin resistance: DETECTED — AB
Methicillin resistance: NOT DETECTED
Neisseria meningitidis: NOT DETECTED
Neisseria meningitidis: NOT DETECTED
Proteus species: NOT DETECTED
Proteus species: NOT DETECTED
Pseudomonas aeruginosa: NOT DETECTED
Pseudomonas aeruginosa: NOT DETECTED
Serratia marcescens: NOT DETECTED
Serratia marcescens: NOT DETECTED
Staphylococcus aureus (BCID): NOT DETECTED
Staphylococcus aureus (BCID): NOT DETECTED
Staphylococcus species: DETECTED — AB
Staphylococcus species: DETECTED — AB
Streptococcus agalactiae: NOT DETECTED
Streptococcus agalactiae: NOT DETECTED
Streptococcus pneumoniae: NOT DETECTED
Streptococcus pneumoniae: NOT DETECTED
Streptococcus pyogenes: NOT DETECTED
Streptococcus pyogenes: NOT DETECTED
Streptococcus species: NOT DETECTED
Streptococcus species: NOT DETECTED

## 2018-08-21 LAB — ECHOCARDIOGRAM COMPLETE
Height: 70 in
Weight: 2927.71 oz

## 2018-08-21 LAB — GLUCOSE, CAPILLARY
Glucose-Capillary: 309 mg/dL — ABNORMAL HIGH (ref 70–99)
Glucose-Capillary: 240 mg/dL — ABNORMAL HIGH (ref 70–99)
Glucose-Capillary: 124 mg/dL — ABNORMAL HIGH (ref 70–99)
Glucose-Capillary: 129 mg/dL — ABNORMAL HIGH (ref 70–99)

## 2018-08-21 MED ORDER — VANCOMYCIN HCL 10 G IV SOLR
1500.0000 mg | INTRAVENOUS | Status: DC
  Filled 2018-08-21: qty 1500

## 2018-08-21 MED ORDER — METRONIDAZOLE IN NACL 5-0.79 MG/ML-% IV SOLN
500.0000 mg | Freq: Three times a day (TID) | INTRAVENOUS | Status: DC
  Administered 2018-08-21 – 2018-08-24 (×9): 500 mg via INTRAVENOUS
  Filled 2018-08-21 (×11): qty 100

## 2018-08-21 MED ORDER — VANCOMYCIN HCL 1.5 G IV SOLR
1500.0000 mg | INTRAVENOUS | Status: DC
  Administered 2018-08-21 – 2018-08-23 (×3): 1500 mg via INTRAVENOUS
  Filled 2018-08-21 (×4): qty 1500

## 2018-08-21 MED ORDER — SODIUM CHLORIDE 0.9 % IV SOLN
2.0000 g | INTRAVENOUS | Status: DC
  Administered 2018-08-22 – 2018-08-24 (×3): 2 g via INTRAVENOUS
  Filled 2018-08-21 (×3): qty 2

## 2018-08-21 NOTE — TOC Progression Note (Signed)
Transition of Care [facility]) - Progression Note    Patient Details  Name: Spencer Edwards MRN: [id number] Date of Birth: March 11, 1940  Transition of Care [facility]) CM/SW Contact  Beverly Sessions, RN Phone Number: 08/21/2018, 4:43 PM  Clinical Narrative:     Notified by Cardell Peach as DSS [phone]) that APS report has been accepted.  She is to call the floor and complete an assessment via facetime  Expected Discharge Plan: [place] Barriers to Discharge: Continued Medical Work up  Expected Discharge Plan and Services Expected Discharge Plan: [place]   Discharge Planning Services: CM Consult   Living arrangements for the past 2 months: Single Family Home                                       Social Determinants of Health (SDOH) Interventions    Readmission Risk Interventions No flowsheet data found.

## 2018-08-21 NOTE — Consult Note (Signed)
[place] Nurse wound consult note Patient receiving care in [facility].  States he fell at home and was on the floor for 5 days. Reason for Consult: sacral wound Wound type: unstageable, dark brown necrotic wound measuring 7 cm x 7 cm on the sacrum/coccyx.  A foam dressing is over the area.  No odor, drainage, or induration associated with the wound. Surrounded by dark pink tissue, but it blanches.  The right scapula has an abrasion that measures 4 cm x 1.2 cm is pink and yellow.  No odor associated with this wound. Surrounded by dark pink tissue, but it blanches. This area can be topically treated with a piece of Xeroform gauze Kellie Simmering [phone]) and a foam dressing, change every 2 days.  Pressure Injury POA: Yes  Dressing procedure/placement/frequency:  If the plan is to be aggressive with care and attempt to heal, then please have surgery look at debriding the sacral wound also.  This area is not appropriate for Santyl at this time.  If the goal of therapy is to keep the patient comfortable and work towards Palliative or Hospice care, then a more conservative approach with a foam dressing for now is preferred. Additional care interventions include a low air loss mattress, and linen instructions.  Monitor the wound area(s) for worsening of condition such as: Signs/symptoms of infection,  Increase in size,  Development of or worsening of odor, Development of pain, or increased pain at the affected locations.  Notify the medical team if any of these develop.  Thank you for the consult.  Discussed plan of care with the patient.  [place] nurse will not follow at this time.  Please re-consult the [place] team if needed.  Val Riles, RN, MSN, CWOCN, CNS-BC, pager [phone]

## 2018-08-21 NOTE — Progress Notes (Signed)
*  PRELIMINARY RESULTS* Echocardiogram 2D Echocardiogram has been performed.  Spencer Edwards 08/21/2018, 9:33 AM

## 2018-08-21 NOTE — Progress Notes (Signed)
[facility] - [place] [facility]   PATIENT NAME: Spencer Edwards    MR#:  [id number]  DATE OF BIRTH:  01/06/1941  SUBJECTIVE:  CHIEF COMPLAINT:   Chief Complaint  Patient presents with  . Fall  Patient seen today On oxygen via nasal cannula No complaints of chest pain No complains of any shortness of breath Poor historian  REVIEW OF SYSTEMS:    ROS  CONSTITUTIONAL: No documented fever. Has fatigue, weakness. No weight gain, no weight loss.  EYES: No blurry or double vision.  ENT: No tinnitus. No postnasal drip. No redness of the oropharynx.  RESPIRATORY: No cough, no wheeze, no hemoptysis. No dyspnea.  CARDIOVASCULAR: No chest pain. No orthopnea. No palpitations. No syncope.  GASTROINTESTINAL: No nausea, no vomiting or diarrhea. No abdominal pain. Has melena GENITOURINARY: No dysuria or hematuria.  ENDOCRINE: No polyuria or nocturia. No heat or cold intolerance.  HEMATOLOGY: No anemia. No bruising. No bleeding.  INTEGUMENTARY: Has decubitus ulcer Has non healing ulcer on the leg.  MUSCULOSKELETAL: One leg amputated NEUROLOGIC: No numbness, tingling, or ataxia. No seizure-type activity.  PSYCHIATRIC: No anxiety. No insomnia. No ADD.   DRUG ALLERGIES:  No Known Allergies  VITALS:  Blood pressure 112/67, pulse 92, temperature 97.8 F (36.6 C), temperature source Oral, resp. rate 19, height 5\' 10"  (1.778 m), weight 83 kg, SpO2 100 %.  PHYSICAL EXAMINATION:   Physical Exam  GENERAL:  78 y.o.-year-old patient lying in the bed with no acute distress.  EYES: Pupils equal, round, reactive to light and accommodation. No scleral icterus. Extraocular muscles intact.  HEENT: Head atraumatic, normocephalic. Oropharynx and nasopharynx clear.  NECK:  Supple, no jugular venous distention. No thyroid enlargement, no tenderness.  LUNGS: Normal breath sounds bilaterally, no wheezing, rales, rhonchi. No use of accessory muscles of respiration.  CARDIOVASCULAR: S1,  S2 normal. No murmurs, rubs, or gallops.  ABDOMEN: Soft, nontender, nondistended. Bowel sounds present. No organomegaly or mass.  EXTREMITIES: No cyanosis, clubbing  Prior amputation right leg NEUROLOGIC: Cranial nerves II through XII are intact. No focal Motor or sensory deficits b/l.   PSYCHIATRIC: The patient is alert and oriented x 3.  SKIN: Decubitus ulcer Ulcer left leg, left foot               LABORATORY PANEL:   CBC Recent Labs  Lab 08/21/18 0535  WBC 12.4*  HGB 15.2  HCT 49.5  PLT 285   ------------------------------------------------------------------------------------------------------------------ Chemistries  Recent Labs  Lab 08/20/18 1314 08/21/18 0535  NA 143 149*  K 4.7 4.1  CL 103 118*  CO2 24 22  GLUCOSE 492* 343*  BUN 75* 56*  CREATININE 1.50* 1.16  CALCIUM 9.9 8.8*  AST 52*  --   ALT 52*  --   ALKPHOS 88  --   BILITOT 1.7*  --    ------------------------------------------------------------------------------------------------------------------  Cardiac Enzymes No results for input(s): TROPONINI in the last 168 hours. ------------------------------------------------------------------------------------------------------------------  RADIOLOGY:  Ct Head Wo Contrast  Result Date: 08/20/2018 CLINICAL DATA:  Status post fall, found down. Multiple soft tissue wounds in pressure sores. EXAM: CT HEAD WITHOUT CONTRAST TECHNIQUE: Contiguous axial images were obtained from the base of the skull through the vertex without intravenous contrast. COMPARISON:  Head CT dated 12/30/2013. FINDINGS: Brain: Generalized age related parenchymal volume loss with commensurate dilatation of the ventricles and sulci. No mass, hemorrhage, edema or other evidence of acute parenchymal abnormality. No extra-axial hemorrhage. Vascular: No hyperdense vessel or unexpected calcification. Skull: Normal. Negative for fracture  or focal lesion. Sinuses/Orbits: No acute finding.  Other: None. IMPRESSION: No acute findings in the brain. No evidence of intracranial mass, hemorrhage or edema. Electronically Signed   By: Franki Cabot M.D.   On: 08/20/2018 14:06   Dg Chest Port 1 View  Result Date: 08/20/2018 CLINICAL DATA:  Fall. EXAM: PORTABLE CHEST 1 VIEW COMPARISON:  Radiograph of January 12, 2014. FINDINGS: The heart size and mediastinal contours are within normal limits. Both lungs are clear. No pneumothorax or pleural effusion is noted. The visualized skeletal structures are unremarkable. IMPRESSION: No active disease. Electronically Signed   By: Marijo Conception M.D.   On: 08/20/2018 13:29   Dg Foot Complete Left  Result Date: 08/20/2018 CLINICAL DATA:  Open wound, concern for fracture/osteomyelitis EXAM: LEFT FOOT - COMPLETE 3+ VIEW COMPARISON:  None. FINDINGS: There is an open wound of the tip of the left great toe with gross bony destruction of the exposed tip of the distal phalanx, consistent with osteomyelitis. There is a single interphalangeal joint of the second digit with dislocation of the distal phalanx. There may be additional bony erosion of the distal aspect of the proximal left fourth phalanx. There is diffuse soft tissue edema about the left foot with additional open wounds about the dorsum of the foot. IMPRESSION: There is an open wound of the tip of the left great toe with gross bony destruction of the exposed tip of the distal phalanx, consistent with osteomyelitis. There is a single interphalangeal joint of the second digit with dislocation of the distal phalanx. There may be additional bony erosion of the distal aspect of the proximal left fourth phalanx. There is diffuse soft tissue edema about the left foot with additional open wounds about the dorsum of the foot. Contrast enhanced MRI may be helpful to evaluate for extent of multifocal osteomyelitis. Electronically Signed   By: Eddie Candle M.D.   On: 08/20/2018 13:56     ASSESSMENT AND PLAN:  78 year old  male patient with history of diabetes mellitus type 2, coronary disease, non-STEMI,, diabetes mellitus type 2, GERD, hyperlipidemia, hypertension, peripheral vascular disease, rhabdomyolysis currently under hospitalist service  -Sepsis secondary to left leg infection May need amputation Vascular surgery consult Continue IV vancomycin and Zosyn antibiotics Added IV Flagyl antibiotic Infectious disease consult Follow-up cultures and lactic acid level  -Elevated troponin Could be from demand ischemia from sepsis Has setting of GI bleed hence anticoagulation could not be done Continue beta-blocker Statin cannot be given secondary to rhabdomyolysis Cardiology consult and echocardiogram Cardiac monitoring  -Acute rhabdomyolysis Follow-up CK levels Continue to hold statin medication  -Acute gastrointestinal bleeding Serial hemoglobin hematocrit monitoring IV Protonix drip Gastroenterology consult Interventions might be not possible secondary to multiple medical problems and patient cannot tolerated  -Acute renal insufficiency Monitor renal function with IV fluids Avoid nephrotoxic medication  -Decubitus ulcer infected Wound care consult Surgery consult would debridement Broad-spectrum antibiotics  -Palliative care consult To address goals of care  -Long-term prognosis poor   All the records are reviewed and case discussed with Care Management/Social Worker. Management plans discussed with the patient, family and they are in agreement.  CODE STATUS: DNR  DVT Prophylaxis: SCDs  TOTAL TIME TAKING CARE OF THIS PATIENT: 40 minutes.   POSSIBLE D/C IN 3 to 4 DAYS, DEPENDING ON CLINICAL CONDITION.  Saundra Shelling M.D on 08/21/2018 at 12:49 PM  Between 7am to 6pm - Pager - [phone]  After 6pm go to [email or web address] - [place]  [facility]  Office  [phone]  CC: Primary care physician; Patient, No Pcp Per  Note: This dictation was prepared  with Dragon dictation along with smaller phrase technology. Any transcriptional errors that result from this process are unintentional.

## 2018-08-21 NOTE — Progress Notes (Signed)
Inpatient Diabetes Program Recommendations  AACE/ADA: New Consensus Statement on Inpatient Glycemic Control   Target Ranges:  Prepandial:   less than 140 mg/dL      Peak postprandial:   less than 180 mg/dL (1-2 hours)      Critically ill patients:  140 - 180 mg/dL   Results for NAKOA, GANUS (MRN [id number]) as of 08/21/2018 08:54  Ref. Range 08/20/2018 21:11 08/21/2018 07:59  Glucose-Capillary Latest Ref Range: 70 - 99 mg/dL 392 (H) 309 (H)   Review of Glycemic Control  Diabetes history: DM2 Outpatient Diabetes medications: Lantus 24 units QHS Current orders for Inpatient glycemic control: Lantus 10 units QHS, Novolog 0-9 units TID with meals, Novolog 0-5 units QHS  Inpatient Diabetes Program Recommendations:   Insulin - Basal: Please consider increasing Lantus to 20 units QHS.  Insulin - Meal Coverage: Please consider ordering Novolog 3 units TID with meals for meal coverage if patient eats at least 50% of meals.  HgbA1C: A1C in process.  Thanks, Barnie Alderman, RN, MSN, CDE Diabetes Coordinator Inpatient Diabetes Program [phone] (Team Pager from 8am to 5pm)

## 2018-08-21 NOTE — Progress Notes (Addendum)
Per MD okay for RN to DC aspirin. Pt is being tx for GI bleed.  RN voiced concern to MD about pts tro[ponins levels and ask if he needed to be transfer to a telemetry floor. Per MD pt is currently stable and will wait for cardiology recommendations.  RN and NT change wound dressings while MD was present. MD was able to assess each wound. Pt has been place in a air mattress. Will continue to assess and monitor pt.

## 2018-08-21 NOTE — Consult Note (Addendum)
[facility] SURGICAL CONSULTATION NOTE (initial) - cpt: [phone]   HISTORY OF PRESENT ILLNESS (HPI):  78 y.o. male presented to [facility] ED via EMS yesterday after being found on the ground in his home by [employer]. History is primarily obtained through chart review as patient is somewhat poor historian. He reportedly fell out of bed approximately 4 days ago and crawled to his kitchen to try and call EMS but was unable to get up. He was found in a very disheveled environment and thought to have melanotic stool. In the ED, he was found to have elevated rhabdomyolysis, elevated lactic acidosis, AKI, upper GI bleed, and elevated high sensitivity troponin with ST changes on EKG. Cardiology was consulted and in this clinical context felt conservative management was appropriate. He was admitted to the medicine service, started on IV ABx and aggressive IVF resuscitation.    He was additionally found to have a chronically ulcerated left leg with gross purulent drainage. He has a previous history of right BKA. 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 Blood cultures were obtained and are concerning for methicillin resistance staph. He was additionally found to have multiple pressure injuries on his upper back and an unstageable decubitus ulcer to his sacrum. WOC RN evaluated this and recommended surgical evaluation for debridement. Patient reports this is from laying on the floor and he did not have this before his fall. He reports that he lives alone and uses a wheelchair to mobilize.   Surgery is consulted by hospitalist physician Dr. Estanislado Pandy, MD in this context for evaluation and management of decubitus sacral ulcer.   PAST MEDICAL HISTORY (PMH):  Past Medical History:  Diagnosis Date  . Acute kidney failure ([place])   . Anxiety disorder   . Coronary artery disease    Non-ST elevation myocardial infarction in November 2015 in the setting of septic shock and multiorgan failure.  Peak troponin was 25. Echo showed normal LV systolic function with inferior and posterior hypokinesis.  . Diabetes mellitus without complication ([place])   . Dysphagia   . Gastro-esophageal reflux   . Hematuria   . Hyperlipidemia   . Hypernatremia   . Hypertension   . MI (myocardial infarction) ([place])   . Muscle weakness   . PVD (peripheral vascular disease) ([place])   . Rhabdomyolysis   . Urinary retention      PAST SURGICAL HISTORY ([place]):  Past Surgical History:  Procedure Laterality Date  . ABDOMINAL SURGERY    . leg amputation       MEDICATIONS:  Prior to Admission medications   Medication Sig Start Date End Date Taking? Authorizing Provider  ALPRAZolam (XANAX) 0.25 MG tablet Take 0.25 mg by mouth 3 (three) times daily.   Yes [provider]  Amino Acids-Protein Hydrolys (FEEDING SUPPLEMENT, PRO-STAT SUGAR FREE 64,) LIQD Take 30 mLs by mouth 2 (two) times daily.   Yes [provider]  aspirin EC 81 MG tablet Take 81 mg by mouth daily.    Yes [provider]  atorvastatin (LIPITOR) 80 MG tablet Take 80 mg by mouth daily.   Yes [provider]  bisacodyl (DULCOLAX) 10 MG suppository Place 10 mg rectally as needed for moderate constipation.   Yes [provider]  clopidogrel (PLAVIX) 75 MG tablet Take 75 mg by mouth daily.   Yes [provider]  famotidine (PEPCID) 20 MG tablet Take 20 mg by mouth daily.   Yes [provider]  insulin glargine (LANTUS) 100  UNIT/ML injection Inject 24 Units into the skin at bedtime.   Yes [provider]  ipratropium-albuterol (DUONEB) 0.5-2.5 (3) MG/3ML SOLN Take 3 mLs by nebulization every 4 (four) hours as needed.   Yes [provider]  metoprolol tartrate (LOPRESSOR) 25 MG tablet Take 12.5 mg by mouth 2 (two) times daily.   Yes [provider]  promethazine (PHENERGAN) 25 MG tablet Take 25 mg by mouth every 8 (eight) hours as needed for nausea or vomiting.   Yes  [provider]  sennosides (SENOKOT) 8.8 MG/5ML syrup Take 5 mLs by mouth every 12 (twelve) hours.   Yes [provider]  sulfamethoxazole-trimethoprim (BACTRIM DS,SEPTRA DS) 800-160 MG per tablet Take 1 tablet by mouth daily.   Yes [provider]  tamsulosin (FLOMAX) 0.4 MG CAPS capsule Take 0.4 mg by mouth daily.   Yes [provider]     ALLERGIES:  No Known Allergies   SOCIAL HISTORY:  Social History   Socioeconomic History  . Marital status: Single    Spouse name: Not on file  . Number of children: Not on file  . Years of education: Not on file  . Highest education level: Not on file  Occupational History  . Not on file  Social Needs  . Financial resource strain: Not on file  . Food insecurity    Worry: Not on file    Inability: Not on file  . Transportation needs    Medical: Not on file    Non-medical: Not on file  Tobacco Use  . Smoking status: Former Smoker    Types: Cigarettes  . Smokeless tobacco: Never Used  Substance and Sexual Activity  . Alcohol use: No  . Drug use: No  . Sexual activity: Not on file  Lifestyle  . Physical activity    Days per week: Not on file    Minutes per session: Not on file  . Stress: Not on file  Relationships  . Social Musicianconnections    Talks on phone: Not on file    Gets together: Not on file    Attends religious service: Not on file    Active member of club or organization: Not on file    Attends meetings of clubs or organizations: Not on file    Relationship status: Not on file  . Intimate partner violence    Fear of current or ex partner: Not on file    Emotionally abused: Not on file    Physically abused: Not on file    Forced sexual activity: Not on file  Other Topics Concern  . Not on file  Social History Narrative  . Not on file     FAMILY HISTORY:  Family History  Problem Relation Age of Onset  . Heart Problems Father   . Heart disease Father       REVIEW OF SYSTEMS:   Review of Systems  Unable to perform ROS: Mental status change  Constitutional: Negative for chills and fever.  Musculoskeletal: Positive for falls.  Skin:       + Sacral Decubitus Ulcer    VITAL SIGNS:  Temp:  [97.3 F (36.3 C)-98.7 F (37.1 C)] 97.8 F (36.6 C) (07/10 0422) Pulse Rate:  [92-106] 92 (07/10 0422) Resp:  [14-29] 19 (07/10 0422) BP: (110-121)/(66-79) 112/67 (07/10 0422) SpO2:  [92 %-100 %] 100 % (07/10 0422) Weight:  [83 kg] 83 kg (07/09 1326)     Height: 5\' 10"  (177.8 cm) Weight: 83 kg BMI (  Calculated): 26.26   INTAKE/OUTPUT:  No intake/output data recorded.  PHYSICAL EXAM:  Physical Exam Vitals signs and nursing note reviewed. Exam conducted with a chaperone present.  Constitutional:      General: He is not in acute distress.    Appearance: He is not ill-appearing.     Comments: Very disheveled appearance  HENT:     Head: Normocephalic and atraumatic.     Mouth/Throat:     Comments: Poor dentition Eyes:     Conjunctiva/sclera: Conjunctivae normal.     Pupils: Pupils are equal, round, and reactive to light.  Pulmonary:     Effort: Pulmonary effort is normal. No respiratory distress.     Comments: On nasal cannula Genitourinary:    Comments: Deferred, foley in place Musculoskeletal:     Comments: Left upper extremity with decorticate contracture  Skin:    General: Skin is warm and dry.          Comments: See Pictures  Neurological:     Mental Status: He is alert.    Wound (08/21/2018) - Sacral Decubitus Ulcer    Wounds (08/21/2018) - Upper Back:     Left Lower Leg (08/21/2018):      Labs:  CBC Latest Ref Rng & Units 08/21/2018 08/20/2018 01/10/2014  WBC 4.0 - 10.5 K/uL 12.4(H) 14.9(H) 17.6(H)  Hemoglobin 13.0 - 17.0 g/dL 86.515.2 17.1(H) 12.7(L)  Hematocrit 39.0 - 52.0 % 49.5 53.6(H) 39.1(L)  Platelets 150 - 400 K/uL 285 390 232   CMP Latest Ref Rng & Units 08/21/2018 08/20/2018 01/14/2014  Glucose 70 - 99 mg/dL 784(O343(H) 962(X492(H) 67  BUN 8  - 23 mg/dL 52(W56(H) 41(L75(H) 24(M29(H)  Creatinine 0.61 - 1.24 mg/dL 0.101.16 2.72(Z1.50(H) 3.66(Y1.36(H)  Sodium 135 - 145 mmol/L 149(H) 143 145  Potassium 3.5 - 5.1 mmol/L 4.1 4.7 3.8  Chloride 98 - 111 mmol/L 118(H) 103 107  CO2 22 - 32 mmol/L 22 24 31   Calcium 8.9 - 10.3 mg/dL 4.0(H8.8(L) 9.9 4.7(Q8.2(L)  Total Protein 6.5 - 8.1 g/dL - 7.6 -  Total Bilirubin 0.3 - 1.2 mg/dL - 1.7(H) -  Alkaline Phos 38 - 126 U/L - 88 -  AST 15 - 41 U/L - 52(H) -  ALT 0 - 44 U/L - 52(H) -     Imaging studies:   XR Left Foot (08/20/2018) personally reviewed and radiologist report reviewed: IMPRESSION: There is an open wound of the tip of the left great toe with gross bony destruction of the exposed tip of the distal phalanx, consistent with osteomyelitis. There is a single interphalangeal joint of the second digit with dislocation of the distal phalanx. There may be additional bony erosion of the distal aspect of the proximal left fourth phalanx. There is diffuse soft tissue edema about the left foot with additional open wounds about the dorsum of the foot.   Assessment/Plan: (ICD-10's: L89.150) 78 y.o. very debilitated male who was found to have sepsis, most likely with LLE being source, following a fall at home 4 days ago with prolonged downtime, which is complicated by rhabdomyolysis, AKI, elevated troponin and unstageable sacral decubitus ulceration    - Regarding unstageable sacral decubitus ulceration, this wound would benefit from debridement which would most likely be done in OR but does not need urgently done. Continue local wound care for now, appreciate WOC RN help. Although patient verbalizes he would be okay with surgery, we would recommend establishing goals of care as patient may not fully understand clinical condition and does not have any  reported family members to help make decisions  - Continue IV Abx; follow up cultures (methicillin resistance staph)  - Continue aggressive IVF resuscitation  - pain control prn  -  monitor leukocytosis; lactic acidosis  - Agree with IV Protonix for GI bleed  - Agree with vascular surgery consultation for left lower extremity  - Appreciate cardiology input  - Further management per primary service; will follow  All of the above findings and recommendations were discussed with the patient and the medical team, and all of patient's questions were answered to his expressed satisfaction.  Thank you for the opportunity to participate in this patient's care.   -- Lynden OxfordZachary Jahnay Lantier, PA-C [facility] 08/21/2018, 11:03 AM [phone] M-F: 7am - 4pm

## 2018-08-21 NOTE — Consult Note (Signed)
Pharmacy Antibiotic Note  Spencer Edwards is a 78 y.o. male admitted on 08/20/2018 with a Wound infection.  Pharmacy has been consulted for cefepime and vancomycin dosing. He has a leg infection which likely will need an amputation at some point. Vascular surgery consultation is pending at this time. Additionally, he has an unstageable sacral decubitus ulceration. Leucocytosis has improved somewhat since admission and SCr has improved to what appears to be his baseline  Plan:  1) continue cefepime 2 g q12H   2) increase vancomycin dose to 1500 mg q24H Predicted AUC is 516 SCr used 1.16 T1/2: 14.2h  Vd: 0.72 L/kg (BMI 26) SCr in am  Height: 5\' 10"  (177.8 cm) Weight: 182 lb 15.7 oz (83 kg) IBW/kg (Calculated) : 73  Temp (24hrs), Avg:97.8 F (36.6 C), Min:97.3 F (36.3 C), Max:98.7 F (37.1 C)  Recent Labs  Lab 08/20/18 1314 08/20/18 1536 08/20/18 1649 08/21/18 0535  WBC 14.9*  --   --  12.4*  CREATININE 1.50*  --   --  1.16  LATICACIDVEN  --  2.9* 2.3*  --     Estimated Creatinine Clearance: 53.3 mL/min (by C-G formula based on SCr of 1.16 mg/dL).    Antimicrobials this admission: 7/9 cefepime >>  7/9 vancomycin >>  7/10 metronidazole >>  Microbiology results: 7/9 BCx: 1/4 GPC MecA (-), 1/4 GNR & GPC MecA (+)   Thank you for allowing pharmacy to be a part of this patient's care.  Dallie Piles, PharmD 08/21/2018 11:50 AM

## 2018-08-21 NOTE — Consult Note (Signed)
NAME: Spencer BrunnerRichard T Wareing  DOB: 03/20/1940  MRN: [id number]  Date/Time: 08/21/2018 7:58 PM  REQUESTING PROVIDER: Pyreddy Subjective:  REASON FOR CONSULT: Bacteremia ?Chart reviewed, some history from patient Spencer Edwards is a 78 y.o. with a history of Patient presented to the emergency department on 08/20/2018 brought in by EMS after being found by [employer] on his home kitchen floor covered in melanotic stool and urine.  Patient reportedly had fallen out of bed 4 days ago and crawled to the kitchen and tried to call 911 but was unable to stand.  He did not have any chest pain.  He feels weak and was complaining of thirst.His friend called EMS.As per the ED note patient has not been taking any medication for the past 4 days. In the ED his vitals were blood pressure of 112/66, heart rate of 104,Temperature of 97.3. Labs revealed a creatinine of 1.50, glucose of 492, AST of 52, ALT of 321, CK of 1107, troponin of 13.7And a lactate of 2.9.  WBC was 14.9, hemoglobin was 17.1, hematocrit was 53.6, platelet was 390.  EKG showed diffuse ST changes. The diagnosis was clinical sepsis with left leg infection and he was started on vancomycin and Zosyn.  Acute myocardial infarction was questioned because the troponin of 13 ,722 and cardiology was consulted.  Because the GI bleed he was started on Protonix drip.  I am asked to see the patient because of blood cultures have come back positive for coag negative methicillin sensitive staph in 1 bottle and coag negative methicillin-resistant staph on antibiotic. 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 Has not smoked in 20 years has not had alcohol in many years as well He has a wheelchair and right prosthesis to get around Family History  Problem Relation Age of Onset  . Heart Problems Father   . Heart disease Father    No Known Allergies  ? Current Facility-Administered Medications  Medication Dose Route Frequency Provider Last Rate Last Dose  . 0.9 %  sodium chloride infusion   Intravenous Continuous Alford HighlandWieting, Kayla, MD 75 mL/hr at 08/21/18 1537    . acetaminophen (TYLENOL) tablet 650 mg  650 mg Oral Q6H PRN Alford HighlandWieting, Giuliano, MD       Or  . acetaminophen (TYLENOL) suppository 650 mg  650 mg Rectal Q6H PRN Wieting, Cornelius, MD      . ALPRAZolam Prudy Feeler(XANAX) tablet 0.25 mg  0.25 mg Oral TID Alford HighlandWieting, Berlin, MD   0.25 mg at 08/21/18 1702  . bisacodyl (DULCOLAX) suppository 10 mg  10 mg Rectal PRN Alford HighlandWieting, Ciaran, MD      . ceFEPIme (MAXIPIME) 2 g in sodium chloride 0.9 % 100 mL IVPB  2 g Intravenous Q12H Alford HighlandWieting, Harveer, MD   Stopped at 08/21/18 1324  . famotidine (PEPCID) tablet 20 mg  20 mg Oral Daily Alford HighlandWieting, Yomar, MD   20 mg at 08/21/18 0943  . insulin aspart (novoLOG) injection 0-5 Units  0-5 Units Subcutaneous QHS Alford HighlandWieting, Savion, MD  5 Units at 08/20/18 2153  . insulin aspart (novoLOG) injection 0-9 Units  0-9 Units Subcutaneous TID WC Loletha Grayer, MD   1 Units at 08/21/18 1702  . insulin glargine (LANTUS) injection 10 Units  10 Units Subcutaneous QHS Loletha Grayer, MD   10 Units at 08/20/18 2258  . ipratropium-albuterol (DUONEB) 0.5-2.5 (3) MG/3ML nebulizer solution 3 mL  3 mL Nebulization Q4H PRN Wieting, Cainen, MD      . metoprolol succinate (TOPROL-XL) 24 hr tablet 12.5 mg  12.5  mg Oral Daily Loletha Grayer, MD   12.5 mg at 08/21/18 0943  . metroNIDAZOLE (FLAGYL) IVPB 500 mg  500 mg Intravenous Q8H Saundra Shelling, MD   Stopped at 08/21/18 1437  . ondansetron (ZOFRAN) tablet 4 mg  4 mg Oral Q6H PRN Loletha Grayer, MD       Or  . ondansetron (ZOFRAN) injection 4 mg  4 mg Intravenous Q6H PRN Wieting, Shae, MD      . oxyCODONE (Oxy IR/ROXICODONE) immediate release tablet 5 mg  5 mg Oral Q4H PRN Wieting, Davidmichael, MD      . pantoprazole (PROTONIX) 80 mg in sodium chloride 0.9 % 250 mL (0.32 mg/mL) infusion  8 mg/hr Intravenous Continuous Loletha Grayer, MD 25 mL/hr at 08/21/18 1700 8 mg/hr at 08/21/18 1700  . tamsulosin (FLOMAX) capsule 0.4 mg  0.4 mg Oral Daily Loletha Grayer, MD   0.4 mg at 08/21/18 0943  . vancomycin (VANCOCIN) 1,500 mg in sodium chloride 0.9 % 500 mL IVPB  1,500 mg Intravenous Q24H Charlett Nose, RPH 250 mL/hr at 08/21/18 1701 1,500 mg at 08/21/18 1701     Abtx:  Anti-infectives (From admission, onward)   Start     Dose/Rate Route Frequency Ordered Stop   08/21/18 1600  vancomycin (VANCOCIN) 1,500 mg in sodium chloride 0.9 % 500 mL IVPB  Status:  Discontinued     1,500 mg 250 mL/hr over 120 Minutes Intravenous Every 24 hours 08/21/18 1200 08/21/18 1219   08/21/18 1600  vancomycin (VANCOCIN) 1,500 mg in sodium chloride 0.9 % 500 mL IVPB     1,500 mg 250 mL/hr over 120 Minutes Intravenous Every 24 hours 08/21/18 1219     08/21/18 1400  metroNIDAZOLE (FLAGYL) IVPB 500 mg     500 mg 100 mL/hr over 60 Minutes Intravenous Every 8 hours 08/21/18 1148     08/20/18 2200  ceFEPIme (MAXIPIME) 2 g in sodium chloride 0.9 % 100 mL IVPB     2 g 200 mL/hr over 30 Minutes Intravenous Every 12 hours 08/20/18 1756     08/20/18 1800  vancomycin (VANCOCIN) IVPB 1000 mg/200 mL premix  Status:  Discontinued     1,000 mg 200 mL/hr over 60 Minutes Intravenous Every 24 hours 08/20/18 1756 08/21/18 1200   08/20/18 1530  vancomycin (VANCOCIN) IVPB 1000 mg/200  mL premix  Status:  Discontinued     1,000 mg 200 mL/hr over 60 Minutes Intravenous Every 2 hours 08/20/18 1334 08/20/18 1754   08/20/18 1330  ceFEPIme (MAXIPIME) 2 g in sodium chloride 0.9 % 100 mL IVPB     2 g 200 mL/hr over 30 Minutes Intravenous  Once 08/20/18 1325 08/20/18 1430   08/20/18 1330  metroNIDAZOLE (FLAGYL) IVPB 500 mg     500 mg 100 mL/hr over 60 Minutes Intravenous  Once 08/20/18 1325 08/20/18 1648   08/20/18 1330  vancomycin (VANCOCIN) IVPB 1000 mg/200 mL premix  Status:  Discontinued     1,000 mg 200 mL/hr over  60 Minutes Intravenous  Once 08/20/18 1325 08/20/18 1334      REVIEW OF SYSTEMS:  Const: negative fever, negative chills, negative weight loss Eyes: negative diplopia or visual changes, negative eye pain ENT: negative coryza, negative sore throat Resp: negative cough, hemoptysis, dyspnea Cards: negative for chest pain, palpitations, lower extremity edema GU: negative for frequency, dysuria and hematuria GI: Negative for abdominal pain, diarrhea, bleeding, constipation Skin: negative for rash and pruritus Heme: negative for easy bruising and gum/nose bleeding MS: generalized weakness Neurolo:negative for headaches, dizziness, vertigo, memory problems  Psych: negative for feelings of anxiety, depression  Endocrine: negative for thyroid, diabetes Allergy/Immunology- negative for any medication or food allergies ?  Objective:  VITALS:  BP 102/63 (BP Location: Left Arm)   Pulse 78   Temp 98.2 F (36.8 C)   Resp 18   Ht  (1.778 m)   Wt 83 kg   SpO2 94%   BMI 26.26 kg/m  PHYSICAL EXAM:  General: Alert, cooperative, no distress, unkempt Head: Normocephalic, without obvious abnormality, atraumatic. Eyes: Conjunctivae clear, anicteric sclerae. Pupils are equal ENT Nares normal. No drainage or sinus tenderness. Lips, mucosa, and tongue normal. No Thrush Poor dentition Neck: Supple, Lungs: b/l air entry Heart: s1s2 Abdomen: Soft, lap scar   Back        Extremities:         Neurologic: Grossly non-focal Pertinent Labs Lab Results CBC    Component Value Date/Time   WBC 12.4 (H) 08/21/2018 0535   RBC 5.63 08/21/2018 0535   HGB 15.2 08/21/2018 0535   HGB 12.7 (L) 01/10/2014 0456   HCT 49.5 08/21/2018 0535   HCT 39.1 (L) 01/10/2014 0456   PLT 285 08/21/2018 0535   PLT 232 01/10/2014 0456   MCV 87.9 08/21/2018 0535   MCV 87 01/10/2014 0456   MCH 27.0 08/21/2018 0535   MCHC 30.7 08/21/2018 0535   RDW 13.6 08/21/2018 0535   RDW 14.1 01/10/2014 0456   LYMPHSABS 0.8 08/20/2018 1314   LYMPHSABS 0.9 (L) 01/10/2014 0456   MONOABS 1.3 (H) 08/20/2018 1314   MONOABS 1.7 (H) 01/10/2014 0456   EOSABS 0.0 08/20/2018 1314   EOSABS 0.2 01/10/2014 0456   BASOSABS 0.1 08/20/2018 1314   BASOSABS 0.1 01/10/2014 0456    CMP Latest Ref Rng & Units 08/21/2018 08/20/2018 01/14/2014  Glucose 70 - 99 mg/dL 161(W) 960(A) 67  BUN 8 - 23 mg/dL 54(U) 98(J) 19(J)  Creatinine 0.61 - 1.24 mg/dL 4.78 2.95(A) 2.13(Y)  Sodium 135 - 145 mmol/L 149(H) 143 145  Potassium 3.5 - 5.1 mmol/L 4.1 4.7 3.8  Chloride 98 - 111 mmol/L 118(H) 103 107  CO2 22 - 32 mmol/L Calcium 8.9 - 10.3 mg/dL 8.6(V) 9.9 7.8(I)  Total Protein 6.5 - 8.1 g/dL - 7.6 -  Total Bilirubin 0.3 - 1.2 mg/dL - 1.7(H) -  Alkaline Phos 38 - 126 U/L - 88 -  AST 15 - 41 U/L - 52(H) -  ALT 0 - 44 U/L - 52(H) -      Microbiology: Recent Results (from the past 240 hour(s))  Blood Culture (routine x 2)     Status: None (Preliminary result)   Collection Time: 08/20/18  1:14 PM   Specimen: BLOOD  Result Value Ref Range Status   Specimen Description BLOOD LEFT ANTECUBITAL  Final   Special Requests   Final    BOTTLES DRAWN AEROBIC AND ANAEROBIC Blood Culture results may not be optimal due to an inadequate  volume of blood received in culture bottles   Culture   Final    NO GROWTH < 24 HOURS Performed at [facility], [address] [place], [place]  [address]    Report Status PENDING  Incomplete  Blood Culture (routine x 2)     Status: None (Preliminary result)   Collection Time: 08/20/18  1:29 PM   Specimen: BLOOD  Result Value Ref Range Status   Specimen Description BLOOD RIGHT ANTECUBITAL  Final   Special Requests   Final    BOTTLES DRAWN AEROBIC AND ANAEROBIC Blood Culture adequate volume   Culture  Setup Time   Final    Organism ID to follow GRAM POSITIVE COCCI ANAEROBIC BOTTLE ONLY CRITICAL RESULT CALLED TO, READ BACK BY AND VERIFIED WITH: SHEEMA HALLAJI ON 08/21/2018 AT 0524 QSD GRAM NEGATIVE RODS GRAM POSITIVE COCCI AEROBIC BOTTLE ONLY Performed at [facility], [address], [place], [place] [address]    Culture [facility] POSITIVE COCCI  Final   Report Status PENDING  Incomplete  Blood Culture ID Panel (Reflexed)     Status: Abnormal   Collection Time: 08/20/18  1:29 PM  Result Value Ref Range Status   Enterococcus species NOT DETECTED NOT DETECTED Final   Listeria monocytogenes NOT DETECTED NOT DETECTED Final   Staphylococcus species DETECTED (A) NOT DETECTED Final    Comment: Methicillin (oxacillin) susceptible coagulase negative staphylococcus. Possible blood culture contaminant (unless isolated from more than one blood culture draw or clinical case suggests pathogenicity). No antibiotic treatment is indicated for blood  culture contaminants. CRITICAL RESULT CALLED TO, READ BACK BY AND VERIFIED WITH: SHEEMA HALLAJI ON 08/21/2018 AT 0524 QSD    Staphylococcus aureus (BCID) NOT DETECTED NOT DETECTED Final   Methicillin resistance NOT DETECTED NOT DETECTED Final   Streptococcus species NOT DETECTED NOT DETECTED Final   Streptococcus agalactiae NOT DETECTED NOT DETECTED Final   Streptococcus pneumoniae NOT DETECTED NOT DETECTED Final   Streptococcus pyogenes NOT DETECTED NOT DETECTED Final   Acinetobacter baumannii NOT DETECTED NOT DETECTED Final   Enterobacteriaceae species NOT DETECTED NOT DETECTED Final    Enterobacter cloacae complex NOT DETECTED NOT DETECTED Final   Escherichia coli NOT DETECTED NOT DETECTED Final   Klebsiella oxytoca NOT DETECTED NOT DETECTED Final   Klebsiella pneumoniae NOT DETECTED NOT DETECTED Final   Proteus species NOT DETECTED NOT DETECTED Final   Serratia marcescens NOT DETECTED NOT DETECTED Final   Haemophilus influenzae NOT DETECTED NOT DETECTED Final   Neisseria meningitidis NOT DETECTED NOT DETECTED Final   Pseudomonas aeruginosa NOT DETECTED NOT DETECTED Final   Candida albicans NOT DETECTED NOT DETECTED Final   Candida glabrata NOT DETECTED NOT DETECTED Final   Candida krusei NOT DETECTED NOT DETECTED Final   Candida parapsilosis NOT DETECTED NOT DETECTED Final   Candida tropicalis NOT DETECTED NOT DETECTED Final    Comment: Performed at [facility], [address] [place], [place] [address]  Blood Culture ID Panel (Reflexed)     Status: Abnormal   Collection Time: 08/20/18  1:29 PM  Result Value Ref Range Status   Enterococcus species NOT DETECTED NOT DETECTED Final   Listeria monocytogenes NOT DETECTED NOT DETECTED Final   Staphylococcus species DETECTED (A) NOT DETECTED Final    Comment: Methicillin (oxacillin) resistant coagulase negative staphylococcus. Possible blood culture contaminant (unless isolated from more than one blood culture draw or clinical case suggests pathogenicity). No antibiotic treatment is indicated for blood  culture contaminants. CRITICAL RESULT CALLED TO, READ  BACK BY AND VERIFIED WITH: SHEEMA HALLAJI ON 08/21/2018 AT 0524 QSD    Staphylococcus aureus (BCID) NOT DETECTED NOT DETECTED Final   Methicillin resistance DETECTED (A) NOT DETECTED Final    Comment: CRITICAL RESULT CALLED TO, READ BACK BY AND VERIFIED WITH: SHEEMA HALLAJI ON 08/21/2018 AT 0524 QSD    Streptococcus species NOT DETECTED NOT DETECTED Final   Streptococcus agalactiae NOT DETECTED NOT DETECTED Final   Streptococcus pneumoniae NOT DETECTED NOT  DETECTED Final   Streptococcus pyogenes NOT DETECTED NOT DETECTED Final   Acinetobacter baumannii NOT DETECTED NOT DETECTED Final   Enterobacteriaceae species NOT DETECTED NOT DETECTED Final   Enterobacter cloacae complex NOT DETECTED NOT DETECTED Final   Escherichia coli NOT DETECTED NOT DETECTED Final   Klebsiella oxytoca NOT DETECTED NOT DETECTED Final   Klebsiella pneumoniae NOT DETECTED NOT DETECTED Final   Proteus species NOT DETECTED NOT DETECTED Final   Serratia marcescens NOT DETECTED NOT DETECTED Final   Haemophilus influenzae NOT DETECTED NOT DETECTED Final   Neisseria meningitidis NOT DETECTED NOT DETECTED Final   Pseudomonas aeruginosa NOT DETECTED NOT DETECTED Final   Candida albicans NOT DETECTED NOT DETECTED Final   Candida glabrata NOT DETECTED NOT DETECTED Final   Candida krusei NOT DETECTED NOT DETECTED Final   Candida parapsilosis NOT DETECTED NOT DETECTED Final   Candida tropicalis NOT DETECTED NOT DETECTED Final    Comment: Performed at [facility], [address] [place], [place] [address]  SARS Coronavirus 2 (CEPHEID- Performed in [facility]), Hosp Order     Status: None   Collection Time: 08/20/18  1:36 PM   Specimen: Nasopharyngeal Swab  Result Value Ref Range Status   SARS Coronavirus 2 NEGATIVE NEGATIVE Final    Comment: (NOTE) If result is NEGATIVE SARS-CoV-2 target nucleic acids are NOT DETECTED. The SARS-CoV-2 RNA is generally detectable in upper and lower  respiratory specimens during the acute phase of infection. The lowest  concentration of SARS-CoV-2 viral copies this assay can detect is 250  copies / mL. A negative result does not preclude SARS-CoV-2 infection  and should not be used as the sole basis for treatment or other  patient management decisions.  A negative result may occur with  improper specimen collection / handling, submission of specimen other  than nasopharyngeal swab, presence of viral mutation(s) within  the  areas targeted by this assay, and inadequate number of viral copies  (<250 copies / mL). A negative result must be combined with clinical  observations, patient history, and epidemiological information. If result is POSITIVE SARS-CoV-2 target nucleic acids are DETECTED. The SARS-CoV-2 RNA is generally detectable in upper and lower  respiratory specimens dur ing the acute phase of infection.  Positive  results are indicative of active infection with SARS-CoV-2.  Clinical  correlation with patient history and other diagnostic information is  necessary to determine patient infection status.  Positive results do  not rule out bacterial infection or co-infection with other viruses. If result is PRESUMPTIVE POSTIVE SARS-CoV-2 nucleic acids MAY BE PRESENT.   A presumptive positive result was obtained on the submitted specimen  and confirmed on repeat testing.  While 2019 novel coronavirus  (SARS-CoV-2) nucleic acids may be present in the submitted sample  additional confirmatory testing may be necessary for epidemiological  and / or clinical management purposes  to differentiate between  SARS-CoV-2 and other Sarbecovirus currently known to infect humans.  If clinically indicated additional testing with an alternate test  methodology [phone]([id number])  is advised. The SARS-CoV-2 RNA is generally  detectable in upper and lower respiratory sp ecimens during the acute  phase of infection. The expected result is Negative. Fact Sheet for Patients:  BoilerBrush.com.cyhttps://www.fda.gov/media/136312/download Fact Sheet for Healthcare Providers: https://pope.com/https://www.fda.gov/media/136313/download This test is not yet approved or cleared by the [place] FDA and has been authorized for detection and/or diagnosis of SARS-CoV-2 by FDA under an Emergency Use Authorization (EUA).  This EUA will remain in effect (meaning this test can be used) for the duration of the COVID-19 declaration under Section 564(b)(1) of the Act, 21  U.S.C. section 360bbb-3(b)(1), unless the authorization is terminated or revoked sooner. Performed at [facility], [address] [place], [place] [address]     IMAGING RESULTS: CT head No acute findings in the brain. No evidence of intracranial mass, hemorrhage or edema. CXR-no acute findings Xray foot: bony destruction of the exposed tip of the distal phalanx,consistent with osteomyelitis. There is a single interphalangeal joint of the second digit with dislocation of the distal phalanx. I have personally reviewed the films ? Impression/Recommendation ? 78 year old male with history of diabetes mellitus, hypertension was brought in by the EMS because of lying in stool and urine for 4 days after he had a fall and could not pick himself up ?  Coag neg staph  One MR and one MS -likely they both are skin contaminants  Left leg infection with multiple areas of pressure necrosis and open wounds.foul smelling- currently on vanco/cefepime and flagyl- change cefepime to ceftriaxone- he will need amputation  Pressure necrosis over the back  AKI  Rhabdomyolysis secondary to the fall  Elevated troponins  Acute gastrointestinal bleeding currently on IV Protonix. ___________________________________________________ Discussed with patient Note:  This document was prepared using Dragon voice recognition software and may include unintentional dictation errors.

## 2018-08-21 NOTE — Consult Note (Signed)
[facility] Vascular Consult Note  MRN : [id number]  Spencer Edwards is a 78 y.o. (09-Mar-1940) male who presents with chief complaint of  Chief Complaint  Patient presents with  . Fall   History of Present Illness:  The patient is a 78 year old male with multiple medical issues (see below) who presented to the [facility] emergency department via EMS after being found on the floor for approximately 4 days by [employer].  The patient is a poor historian and information for this consult was obtained by his bedside nurse, his primary team and previous epic notation.  EMS reported that the patient was covered in urine and feces.  EMS also reports the patient was essentially living in the squalor.  The patient apparently fell out of bed and tried to crawl to the kitchen however was unable to stand and laid on the floor for approximately 4 days.  The patient does have multiple wounds and pressure sores noted to the sacrum and upper back.  The patient has an intact right BKA.  The patient has gangrene and multiple open wounds to the forefoot and eschar forming to the heel of the left lower extremity.  The wounds are foul-smelling with drainage.  The patient was also found to have elevated troponin, in rhabdomyolysis with acute on chronic kidney injury and GI bleed during his work-up in the emergency department.  The patient has been seen by palliative care and has changed his CODE STATUS to DNR however wishes to proceed with an amputation if that is what is needed in the future.  Vascular surgery was consulted by Dr. Hilton Sinclair for further recommendations. Current Facility-Administered Medications  Medication Dose Route Frequency Provider Last Rate Last Dose  . 0.9 %  sodium chloride infusion   Intravenous Continuous Alford Highland, MD 75 mL/hr at 08/21/18 1323    . acetaminophen (TYLENOL) tablet 650 mg  650 mg Oral Q6H PRN Alford Highland, MD        Or  . acetaminophen (TYLENOL) suppository 650 mg  650 mg Rectal Q6H PRN Wieting, Brahm, MD      . ALPRAZolam Prudy Feeler) tablet 0.25 mg  0.25 mg Oral TID Alford Highland, MD   0.25 mg at 08/21/18 0943  . bisacodyl (DULCOLAX) suppository 10 mg  10 mg Rectal PRN Alford Highland, MD      . ceFEPIme (MAXIPIME) 2 g in sodium chloride 0.9 % 100 mL IVPB  2 g Intravenous Q12H Wieting, Rambo, MD 200 mL/hr at 08/21/18 1035 2 g at 08/21/18 1035  . famotidine (PEPCID) tablet 20 mg  20 mg Oral Daily Alford Highland, MD   20 mg at 08/21/18 0943  . insulin aspart (novoLOG) injection 0-5 Units  0-5 Units Subcutaneous QHS Alford Highland, MD   5 Units at 08/20/18 2153  . insulin aspart (novoLOG) injection 0-9 Units  0-9 Units Subcutaneous TID WC Alford Highland, MD   3 Units at 08/21/18 1156  . insulin glargine (LANTUS) injection 10 Units  10 Units Subcutaneous QHS Alford Highland, MD   10 Units at 08/20/18 2258  . ipratropium-albuterol (DUONEB) 0.5-2.5 (3) MG/3ML nebulizer solution 3 mL  3 mL Nebulization Q4H PRN Wieting, Abayomi, MD      . metoprolol succinate (TOPROL-XL) 24 hr tablet 12.5 mg  12.5 mg Oral Daily Alford Highland, MD   12.5 mg at 08/21/18 0943  . metroNIDAZOLE (FLAGYL) IVPB 500 mg  500 mg Intravenous Q8H Pyreddy, Vivien Rota, MD 100 mL/hr at 08/21/18  1324 500 mg at 08/21/18 1324  . ondansetron (ZOFRAN) tablet 4 mg  4 mg Oral Q6H PRN Alford HighlandWieting, Lliam, MD       Or  . ondansetron (ZOFRAN) injection 4 mg  4 mg Intravenous Q6H PRN Wieting, Huxton, MD      . oxyCODONE (Oxy IR/ROXICODONE) immediate release tablet 5 mg  5 mg Oral Q4H PRN Wieting, Darry, MD      . pantoprazole (PROTONIX) 80 mg in sodium chloride 0.9 % 250 mL (0.32 mg/mL) infusion  8 mg/hr Intravenous Continuous Alford HighlandWieting, Brinden, MD 25 mL/hr at 08/21/18 0615 8 mg/hr at 08/21/18 0615  . tamsulosin (FLOMAX) capsule 0.4 mg  0.4 mg Oral Daily Alford HighlandWieting, Kenyatte, MD   0.4 mg at 08/21/18 0943  . vancomycin (VANCOCIN) 1,500 mg in sodium  chloride 0.9 % 500 mL IVPB  1,500 mg Intravenous Q24H Bertram SavinSimpson, Michael L, [facility]       Past Medical History:  Diagnosis Date  . Acute kidney failure (HCC)   . Anxiety disorder   . Coronary artery disease    Non-ST elevation myocardial infarction in November 2015 in the setting of septic shock and multiorgan failure. Peak troponin was 25. Echo showed normal LV systolic function with inferior and posterior hypokinesis.  . Diabetes mellitus without complication (HCC)   . Dysphagia   . Gastro-esophageal reflux   . Hematuria   . Hyperlipidemia   . Hypernatremia   . Hypertension   . MI (myocardial infarction) (HCC)   . Muscle weakness   . PVD (peripheral vascular disease) (HCC)   . Rhabdomyolysis   . Urinary retention    Past Surgical History:  Procedure Laterality Date  . ABDOMINAL SURGERY    . leg amputation     Social History Social History   Tobacco Use  . Smoking status: Former Smoker    Types: Cigarettes  . Smokeless tobacco: Never Used  Substance Use Topics  . Alcohol use: No  . Drug use: No   Family History Family History  Problem Relation Age of Onset  . Heart Problems Father   . Heart disease Father   Denies any family history of peripheral artery disease, venous disease and/or bleeding/clotting disorders.  No Known Allergies   REVIEW OF SYSTEMS (Negative unless checked)  Constitutional: [] Weight loss  [] Fever  [] Chills Cardiac: [x] Chest pain   [x] Chest pressure   [] Palpitations   [] Shortness of breath when laying flat   [] Shortness of breath at rest   [] Shortness of breath with exertion. Vascular:  [] Pain in legs with walking   [x] Pain in legs at rest   [x] Pain in legs when laying flat   [] Claudication   [] Pain in feet when walking  [x] Pain in feet at rest  [x] Pain in feet when laying flat   [] History of DVT   [] Phlebitis   [] Swelling in legs   [] Varicose veins   [] Non-healing ulcers Pulmonary:   [] Uses home oxygen   [] Productive cough   [] Hemoptysis   [] Wheeze   [] COPD   [] Asthma Neurologic:  [] Dizziness  [] Blackouts   [] Seizures   [] History of stroke   [] History of TIA  [] Aphasia   [] Temporary blindness   [] Dysphagia   [] Weakness or numbness in arms   [] Weakness or numbness in legs Musculoskeletal:  [] Arthritis   [] Joint swelling   [] Joint pain   [] Low back pain Hematologic:  [] Easy bruising  [] Easy bleeding   [] Hypercoagulable state   [] Anemic  [] Hepatitis Gastrointestinal:  [] Blood in stool   [] Vomiting blood  []   Gastroesophageal reflux/heartburn   [] Difficulty swallowing. Genitourinary:  [] Chronic kidney disease   [] Difficult urination  [] Frequent urination  [] Burning with urination   [] Blood in urine Skin:  [] Rashes   [x] Ulcers   [x] Wounds Psychological:  [] History of anxiety   []  History of major depression.  Physical Examination  Vitals:   08/20/18 1913 08/20/18 2049 08/21/18 0422 08/21/18 1333  BP: 110/68 121/73 112/67 (!) 91/59  Pulse: (!) 106 98 92 85  Resp:  20 19 18   Temp: 97.6 F (36.4 C) 97.8 F (36.6 C) 97.8 F (36.6 C) 98.2 F (36.8 C)  TempSrc: Oral Oral Oral   SpO2: 95% 95% 100% 94%  Weight:      Height:       Body mass index is 26.26 kg/m. Gen:  WD/WN, NAD, Confused Head: [place]/AT, No temporalis wasting. Prominent temp pulse not noted. Ear/Nose/Throat: Hearing grossly intact, nares w/o erythema or drainage, oropharynx w/o Erythema/Exudate Eyes: Sclera non-icteric, conjunctiva clear Neck: Trachea midline.  No JVD.  Pulmonary:  Good air movement, respirations not labored, equal bilaterally.  Cardiac: RRR, normal S1, S2. Vascular:  Vessel Right Left  Radial Palpable Palpable  Ulnar Palpable Palpable  Brachial Palpable Palpable  Carotid Palpable, without bruit Palpable, without bruit  Aorta Not palpable N/A  Femoral Palpable Palpable  Popliteal Palpable Palpable  PT BKA Non-Palpable  DP BKA Non-Palpable   Right Lower Extremity: BKA skin intact.  Left Lower Extremity: Thigh soft. Calf soft.  Nonpalpable pedal  pulses.  Foot is cold.  Toes 1-3 without gangrene.  Ulceration on the dorsal aspect of foot.  Heel with developing eschar.  Skin tears in the beginning of ulceration tracking up calf.     Document Information Photos    08/21/2018 12:11  Attached To:  Hospital Encounter on 08/20/18  Source Information Wofford Stratton, Janalyn Harder, PA-C  [facility]   Gastrointestinal: soft, non-tender/non-distended. No guarding/reflex.  Musculoskeletal: M/S 3/5 throughout.  Neurologic: Sensation grossly intact in extremities.  Symmetrical.  Speech is fluent. Motor exam as listed above. Psychiatric: Judgment intact, Mood & affect appropriate for pt's clinical situation. Dermatologic: As above Lymph : No Cervical, Axillary, or Inguinal lymphadenopathy.  CBC Lab Results  Component Value Date   WBC 12.4 (H) 08/21/2018   HGB 15.2 08/21/2018   HCT 49.5 08/21/2018   MCV 87.9 08/21/2018   PLT 285 08/21/2018   BMET    Component Value Date/Time   NA 149 (H) 08/21/2018 0535   NA 145 01/14/2014 0521   K 4.1 08/21/2018 0535   K 3.8 01/14/2014 0521   CL 118 (H) 08/21/2018 0535   CL 107 01/14/2014 0521   CO2 22 08/21/2018 0535   CO2 31 01/14/2014 0521   GLUCOSE 343 (H) 08/21/2018 0535   GLUCOSE 67 01/14/2014 0521   BUN 56 (H) 08/21/2018 0535   BUN 29 (H) 01/14/2014 0521   CREATININE 1.16 08/21/2018 0535   CREATININE 1.36 (H) 01/14/2014 0521   CALCIUM 8.8 (L) 08/21/2018 0535   CALCIUM 8.2 (L) 01/14/2014 0521   GFRNONAA 60 (L) 08/21/2018 0535   GFRNONAA 54 (L) 01/14/2014 0521   GFRNONAA >60 07/15/2013 0809   GFRAA >60 08/21/2018 0535   GFRAA >60 01/14/2014 0521   GFRAA >60 07/15/2013 0809   Estimated Creatinine Clearance: 53.3 mL/min (by C-G formula based on SCr of 1.16 mg/dL).  COAG Lab Results  Component Value Date   INR 1.3 12/31/2013   INR 1.5 12/30/2013   INR 1.0 07/15/2013    Radiology Ct Head  Wo Contrast  Result Date: 08/20/2018 CLINICAL DATA:  Status post fall, found down.  Multiple soft tissue wounds in pressure sores. EXAM: CT HEAD WITHOUT CONTRAST TECHNIQUE: Contiguous axial images were obtained from the base of the skull through the vertex without intravenous contrast. COMPARISON:  Head CT dated 12/30/2013. FINDINGS: Brain: Generalized age related parenchymal volume loss with commensurate dilatation of the ventricles and sulci. No mass, hemorrhage, edema or other evidence of acute parenchymal abnormality. No extra-axial hemorrhage. Vascular: No hyperdense vessel or unexpected calcification. Skull: Normal. Negative for fracture or focal lesion. Sinuses/Orbits: No acute finding. Other: None. IMPRESSION: No acute findings in the brain. No evidence of intracranial mass, hemorrhage or edema. Electronically Signed   By: Bary RichardStan  Maynard M.D.   On: 08/20/2018 14:06   Dg Chest Port 1 View  Result Date: 08/20/2018 CLINICAL DATA:  Fall. EXAM: PORTABLE CHEST 1 VIEW COMPARISON:  Radiograph of January 12, 2014. FINDINGS: The heart size and mediastinal contours are within normal limits. Both lungs are clear. No pneumothorax or pleural effusion is noted. The visualized skeletal structures are unremarkable. IMPRESSION: No active disease. Electronically Signed   By: Lupita RaiderJames  Green Jr M.D.   On: 08/20/2018 13:29   Dg Foot Complete Left  Result Date: 08/20/2018 CLINICAL DATA:  Open wound, concern for fracture/osteomyelitis EXAM: LEFT FOOT - COMPLETE 3+ VIEW COMPARISON:  None. FINDINGS: There is an open wound of the tip of the left great toe with gross bony destruction of the exposed tip of the distal phalanx, consistent with osteomyelitis. There is a single interphalangeal joint of the second digit with dislocation of the distal phalanx. There may be additional bony erosion of the distal aspect of the proximal left fourth phalanx. There is diffuse soft tissue edema about the left foot with additional open wounds about the dorsum of the foot. IMPRESSION: There is an open wound of the tip of the left  great toe with gross bony destruction of the exposed tip of the distal phalanx, consistent with osteomyelitis. There is a single interphalangeal joint of the second digit with dislocation of the distal phalanx. There may be additional bony erosion of the distal aspect of the proximal left fourth phalanx. There is diffuse soft tissue edema about the left foot with additional open wounds about the dorsum of the foot. Contrast enhanced MRI may be helpful to evaluate for extent of multifocal osteomyelitis. Electronically Signed   By: Lauralyn PrimesAlex  Bibbey M.D.   On: 08/20/2018 13:56   Assessment/Plan The patient is a 78 year old male with multiple medical issues (see below) who presented [facility] department via EMS after being found on the floor for approximately 4 days by [employer]. The patient was also found to have elevated troponin, in rhabdomyolysis with acute on chronic kidney injury and GI bleed during his work-up in the emergency department.  1. Left Lower Extremity Atherosclerotic Disease: The patient's left foot is clearly not salvageable due to the severity and extent of nonviable tissue.  Patient will most likely be a candidate for an above-the-knee amputation due to his wounds tracking upward towards the proximal calf.  Ideally, the patient would benefit from undergoing an angiogram to assess his arterial patency and degree of peripheral artery disease.  If appropriate, an attempt to revascularize the leg can be made at that time to improve a the chance of an above-the-knee amputation healing.  Since the patient has multiple serious medical issues and is not stable enough we will plan  for this on Monday with Dr. Wyn Quakerew.  2. Diabetes: On appropriate medications. Encouraged good control as its slows the progression of atherosclerotic disease 3. Hyperlipidemia: On aspirin and statin for medical management.  Encouraged good control as its slows the progression of  atherosclerotic disease.  Discussed with Dr. Romie JumperSchnier  Ewan Grau A Jerami Tammen, PA-C  08/21/2018 1:52 PM  This note was created with Dragon medical transcription system.  Any error is purely unintentional

## 2018-08-21 NOTE — TOC Initial Note (Signed)
Transition of Care [facility]) - Initial/Assessment Note    Patient Details  Name: Spencer Edwards MRN: [id number] Date of Birth: [birth year]  [facility]) CM/SW Contact:    Spencer Sessions, RN Phone Number: 08/21/2018, 1:35 PM  Clinical Narrative:                 Patient admitted with sepsis after being found down on the floor for approximately 4 days.   Patient poor historian. Patient states that he lives at home alone.  Has meals on wheels delivered to home, and they are the ones who found him on the floor.  Patient states that he has a history of falls "normaly I can get back up"  Patient with a history of right BKA.  Patient came in with multiple severe wounds.  Including his left foot which will likely require amputation.   Patient states there is no one to contact for him.  He states that there is no family other then a cousin Spencer Edwards (of note palliative was told last night was [place]).  Who lives in [place] and may be deceased.   Per medical record APS report was made.  RNCM reached out to DSS and was informed that report was not made.  RNCM made APS report with DSS representative Spencer Edwards.    Medical team aware that patient will require placement at discharge.  Will require PT eval when medically stable.  If mental status does not improve after acute issues are addressed, will likely have to pursue guardianship   Expected Discharge Plan: Skilled Nursing Facility Barriers to Discharge: Continued Medical Work up   Patient Goals and CMS Choice Patient states their goals for this hospitalization and ongoing recovery are:: "to get water to drink"      Expected Discharge Plan and Services Expected Discharge Plan: [place]   Discharge Planning Services: CM Consult   Living arrangements for the past 2 months: Single Family Home                                      Prior Living Arrangements/Services Living arrangements  for the past 2 months: Single Family Home Lives with:: Self              Current home services: Meals on wheels    Activities of Daily Living Home Assistive Devices/Equipment: Other (Comment)    Permission Sought/Granted                  Emotional Assessment       Orientation: : Oriented to Self, Fluctuating Orientation (Suspected and/or reported [place])   Psych Involvement: No (comment)  Admission diagnosis:  Elevated troponin I level [R79.89] Diabetic foot infection ([place]) [P50.932, L08.9] Non-traumatic rhabdomyolysis [M62.82] Sepsis with acute organ dysfunction without septic shock, due to unspecified organism, unspecified type ([place]) [A41.9, R65.20] Patient Active Problem List   Diagnosis Date Noted  . Pressure injury of skin 08/21/2018  . Diabetic foot infection ([place])   . Palliative care encounter   . Sepsis ([place]) 08/20/2018  . Coronary artery disease   . Hyperlipidemia   . Hypertension    PCP:  Patient, No Pcp Per Pharmacy:   [facility], [place] - [place] [address] [place] [address] Phone: [phone] Fax: [phone]     Social Determinants of Health (SDOH) Interventions    Readmission  Risk Interventions No flowsheet data found.

## 2018-08-21 NOTE — Progress Notes (Signed)
PHARMACY - PHYSICIAN COMMUNICATION CRITICAL VALUE ALERT - BLOOD CULTURE IDENTIFICATION (BCID)  Spencer Edwards is an 78 y.o. male who presented to [facility] on 08/20/2018 with a chief complaint of Fall  Assessment:  Patient admitted with sepsis secondary to leg infection, Acute MI, GI bleed and rhabdomyolysis.  BCx showing: 1 of 4 GPC MecA (-), 1 of 4 GNR & GPC MecA (+)   Name of physician (or Provider) Contacted: Dr. Marcille Blanco   Current antibiotics: Cefepime and Vancomycin   Changes to prescribed antibiotics: Will continue with currently antibiotics and monitor cultures/sensitivities   Results for orders placed or performed during the hospital encounter of 08/20/18  Blood Culture ID Panel (Reflexed) (Collected: 08/20/2018  1:29 PM)  Result Value Ref Range   Enterococcus species NOT DETECTED NOT DETECTED   Listeria monocytogenes NOT DETECTED NOT DETECTED   Staphylococcus species DETECTED (A) NOT DETECTED   Staphylococcus aureus (BCID) NOT DETECTED NOT DETECTED   Methicillin resistance DETECTED (A) NOT DETECTED   Streptococcus species NOT DETECTED NOT DETECTED   Streptococcus agalactiae NOT DETECTED NOT DETECTED   Streptococcus pneumoniae NOT DETECTED NOT DETECTED   Streptococcus pyogenes NOT DETECTED NOT DETECTED   Acinetobacter baumannii NOT DETECTED NOT DETECTED   Enterobacteriaceae species NOT DETECTED NOT DETECTED   Enterobacter cloacae complex NOT DETECTED NOT DETECTED   Escherichia coli NOT DETECTED NOT DETECTED   Klebsiella oxytoca NOT DETECTED NOT DETECTED   Klebsiella pneumoniae NOT DETECTED NOT DETECTED   Proteus species NOT DETECTED NOT DETECTED   Serratia marcescens NOT DETECTED NOT DETECTED   Haemophilus influenzae NOT DETECTED NOT DETECTED   Neisseria meningitidis NOT DETECTED NOT DETECTED   Pseudomonas aeruginosa NOT DETECTED NOT DETECTED   Candida albicans NOT DETECTED NOT DETECTED   Candida glabrata NOT DETECTED NOT DETECTED   Candida krusei NOT DETECTED NOT  DETECTED   Candida parapsilosis NOT DETECTED NOT DETECTED   Candida tropicalis NOT DETECTED NOT DETECTED    Pernell Dupre, PharmD, BCPS Clinical Pharmacist 08/21/2018 5:46 AM

## 2018-08-21 NOTE — Consult Note (Signed)
Palliative Medicine [phone]) [phone] [phone]) [phone]   Name: Barry BrunnerRichard T Aubert Date: 08/21/2018 MRN: [id number]  DOB: 01/24/1941  Patient Care Team: Patient, No Pcp Per as PCP - General (General Practice)    REASON FOR CONSULTATION: Palliative Care consult requested for this 78 y.o. male with multiple medical problems including diabetes, CAD, and PVD status post right BKA.  Patient was admitted to the hospital on 08/20/2018 with sepsis from lower extremity infection.  Patient apparently was found at home on the floor after lying there for 4 days.  Patient is currently being medically managed for sepsis but there is concerned that he may need future amputation.  Palliative care was consulted to help address goals.   SOCIAL HISTORY:     reports that he has quit smoking. His smoking use included cigarettes. He has never used smokeless tobacco. He reports that he does not drink alcohol or use drugs.   Patient is not married.  He has no children.  He has no siblings.  Both parents are deceased.  He lives at home alone.  ADVANCE DIRECTIVES:  Does not have  CODE STATUS: DNR  PAST MEDICAL HISTORY: Past Medical History:  Diagnosis Date  . Acute kidney failure (HCC)   . Anxiety disorder   . Coronary artery disease    Non-ST elevation myocardial infarction in November 2015 in the setting of septic shock and multiorgan failure. Peak troponin was 25. Echo showed normal LV systolic function with inferior and posterior hypokinesis.  . Diabetes mellitus without complication (HCC)   . Dysphagia   . Gastro-esophageal reflux   . Hematuria   . Hyperlipidemia   . Hypernatremia   . Hypertension   . MI (myocardial infarction) (HCC)   . Muscle weakness   . PVD (peripheral vascular disease) (HCC)   . Rhabdomyolysis   . Urinary retention     PAST SURGICAL HISTORY:  Past Surgical History:  Procedure Laterality Date  . ABDOMINAL SURGERY    . leg amputation       HEMATOLOGY/ONCOLOGY HISTORY:  Oncology History   No history exists.    ALLERGIES:  has No Known Allergies.  MEDICATIONS:  Current Facility-Administered Medications  Medication Dose Route Frequency Provider Last Rate Last Dose  . 0.9 %  sodium chloride infusion   Intravenous Continuous Alford HighlandWieting, Clayten, MD 75 mL/hr at 08/21/18 0138    . acetaminophen (TYLENOL) tablet 650 mg  650 mg Oral Q6H PRN Alford HighlandWieting, Teng, MD       Or  . acetaminophen (TYLENOL) suppository 650 mg  650 mg Rectal Q6H PRN Wieting, Marvie, MD      . ALPRAZolam Prudy Feeler(XANAX) tablet 0.25 mg  0.25 mg Oral TID Alford HighlandWieting, Hesston, MD   0.25 mg at 08/21/18 0943  . bisacodyl (DULCOLAX) suppository 10 mg  10 mg Rectal PRN Alford HighlandWieting, Tarren, MD      . ceFEPIme (MAXIPIME) 2 g in sodium chloride 0.9 % 100 mL IVPB  2 g Intravenous Q12H Wieting, Wesson, MD 200 mL/hr at 08/21/18 1035 2 g at 08/21/18 1035  . famotidine (PEPCID) tablet 20 mg  20 mg Oral Daily Alford HighlandWieting, Read, MD   20 mg at 08/21/18 0943  . insulin aspart (novoLOG) injection 0-5 Units  0-5 Units Subcutaneous QHS Alford HighlandWieting, Shannon, MD   5 Units at 08/20/18 2153  . insulin aspart (novoLOG) injection 0-9 Units  0-9 Units Subcutaneous TID WC Alford HighlandWieting, Ajahni, MD   7 Units at 08/21/18 [id number]  . insulin glargine (LANTUS) injection 10 Units  10 Units Subcutaneous QHS Alford HighlandWieting, Fitzgerald, MD   10 Units at 08/20/18 2258  . ipratropium-albuterol (DUONEB) 0.5-2.5 (3) MG/3ML nebulizer solution 3 mL  3 mL Nebulization Q4H PRN Wieting, Ike, MD      . metoprolol succinate (TOPROL-XL) 24 hr tablet 12.5 mg  12.5 mg Oral Daily Alford HighlandWieting, Jeryl, MD   12.5 mg at 08/21/18 0943  . ondansetron (ZOFRAN) tablet 4 mg  4 mg Oral Q6H PRN Alford HighlandWieting, Taytum, MD       Or  . ondansetron (ZOFRAN) injection 4 mg  4 mg Intravenous Q6H PRN Wieting, Abdon, MD      . oxyCODONE (Oxy IR/ROXICODONE) immediate release tablet 5 mg  5 mg Oral Q4H PRN Wieting, Napoleon, MD      . pantoprazole (PROTONIX) 80 mg in sodium  chloride 0.9 % 250 mL (0.32 mg/mL) infusion  8 mg/hr Intravenous Continuous Alford HighlandWieting, Neema, MD 25 mL/hr at 08/21/18 0615 8 mg/hr at 08/21/18 0615  . tamsulosin (FLOMAX) capsule 0.4 mg  0.4 mg Oral Daily Alford HighlandWieting, Nazar, MD   0.4 mg at 08/21/18 0943  . vancomycin (VANCOCIN) IVPB 1000 mg/200 mL premix  1,000 mg Intravenous Q24H Alford HighlandWieting, Nguyen, MD   Stopped at 08/20/18 1920    VITAL SIGNS: BP 112/67 (BP Location: Right Arm)   Pulse 92   Temp 97.8 F (36.6 C) (Oral)   Resp 19   Ht 5\' 10"  (1.778 m)   Wt 182 lb 15.7 oz (83 kg)   SpO2 100%   BMI 26.26 kg/m  Filed Weights   08/20/18 1326  Weight: 182 lb 15.7 oz (83 kg)    Estimated body mass index is 26.26 kg/m as calculated from the following:   Height as of this encounter: 5\' 10"  (1.778 m).   Weight as of this encounter: 182 lb 15.7 oz (83 kg).  LABS: CBC:    Component Value Date/Time   WBC 12.4 (H) 08/21/2018 0535   HGB 15.2 08/21/2018 0535   HGB 12.7 (L) 01/10/2014 0456   HCT 49.5 08/21/2018 0535   HCT 39.1 (L) 01/10/2014 0456   PLT 285 08/21/2018 0535   PLT 232 01/10/2014 0456   MCV 87.9 08/21/2018 0535   MCV 87 01/10/2014 0456   NEUTROABS 12.6 (H) 08/20/2018 1314   NEUTROABS 14.8 (H) 01/10/2014 0456   LYMPHSABS 0.8 08/20/2018 1314   LYMPHSABS 0.9 (L) 01/10/2014 0456   MONOABS 1.3 (H) 08/20/2018 1314   MONOABS 1.7 (H) 01/10/2014 0456   EOSABS 0.0 08/20/2018 1314   EOSABS 0.2 01/10/2014 0456   BASOSABS 0.1 08/20/2018 1314   BASOSABS 0.1 01/10/2014 0456   Comprehensive Metabolic Panel:    Component Value Date/Time   NA 149 (H) 08/21/2018 0535   NA 145 01/14/2014 0521   K 4.1 08/21/2018 0535   K 3.8 01/14/2014 0521   CL 118 (H) 08/21/2018 0535   CL 107 01/14/2014 0521   CO2 22 08/21/2018 0535   CO2 31 01/14/2014 0521   BUN 56 (H) 08/21/2018 0535   BUN 29 (H) 01/14/2014 0521   CREATININE 1.16 08/21/2018 0535   CREATININE 1.36 (H) 01/14/2014 0521   GLUCOSE 343 (H) 08/21/2018 0535   GLUCOSE 67 01/14/2014  0521   CALCIUM 8.8 (L) 08/21/2018 0535   CALCIUM 8.2 (L) 01/14/2014 0521   AST 52 (H) 08/20/2018 1314   AST 91 (H) 01/02/2014 0510   ALT 52 (H) 08/20/2018 1314   ALT 64 (H) 01/02/2014 0510   ALKPHOS 88 08/20/2018 1314   ALKPHOS 70  01/02/2014 0510   BILITOT 1.7 (H) 08/20/2018 1314   BILITOT 1.7 (H) 01/02/2014 0510   PROT 7.6 08/20/2018 1314   PROT 5.1 (L) 01/02/2014 0510   ALBUMIN 3.2 (L) 08/20/2018 1314   ALBUMIN 1.8 (L) 01/04/2014 0315    RADIOGRAPHIC STUDIES: Ct Head Wo Contrast  Result Date: 08/20/2018 CLINICAL DATA:  Status post fall, found down. Multiple soft tissue wounds in pressure sores. EXAM: CT HEAD WITHOUT CONTRAST TECHNIQUE: Contiguous axial images were obtained from the base of the skull through the vertex without intravenous contrast. COMPARISON:  Head CT dated 12/30/2013. FINDINGS: Brain: Generalized age related parenchymal volume loss with commensurate dilatation of the ventricles and sulci. No mass, hemorrhage, edema or other evidence of acute parenchymal abnormality. No extra-axial hemorrhage. Vascular: No hyperdense vessel or unexpected calcification. Skull: Normal. Negative for fracture or focal lesion. Sinuses/Orbits: No acute finding. Other: None. IMPRESSION: No acute findings in the brain. No evidence of intracranial mass, hemorrhage or edema. Electronically Signed   By: Bary RichardStan  Maynard M.D.   On: 08/20/2018 14:06   Dg Chest Port 1 View  Result Date: 08/20/2018 CLINICAL DATA:  Fall. EXAM: PORTABLE CHEST 1 VIEW COMPARISON:  Radiograph of January 12, 2014. FINDINGS: The heart size and mediastinal contours are within normal limits. Both lungs are clear. No pneumothorax or pleural effusion is noted. The visualized skeletal structures are unremarkable. IMPRESSION: No active disease. Electronically Signed   By: Lupita RaiderJames  Green Jr M.D.   On: 08/20/2018 13:29   Dg Foot Complete Left  Result Date: 08/20/2018 CLINICAL DATA:  Open wound, concern for fracture/osteomyelitis EXAM: LEFT  FOOT - COMPLETE 3+ VIEW COMPARISON:  None. FINDINGS: There is an open wound of the tip of the left great toe with gross bony destruction of the exposed tip of the distal phalanx, consistent with osteomyelitis. There is a single interphalangeal joint of the second digit with dislocation of the distal phalanx. There may be additional bony erosion of the distal aspect of the proximal left fourth phalanx. There is diffuse soft tissue edema about the left foot with additional open wounds about the dorsum of the foot. IMPRESSION: There is an open wound of the tip of the left great toe with gross bony destruction of the exposed tip of the distal phalanx, consistent with osteomyelitis. There is a single interphalangeal joint of the second digit with dislocation of the distal phalanx. There may be additional bony erosion of the distal aspect of the proximal left fourth phalanx. There is diffuse soft tissue edema about the left foot with additional open wounds about the dorsum of the foot. Contrast enhanced MRI may be helpful to evaluate for extent of multifocal osteomyelitis. Electronically Signed   By: Lauralyn PrimesAlex  Bibbey M.D.   On: 08/20/2018 13:56    PERFORMANCE STATUS (ECOG) : 3 - Symptomatic, >50% confined to bed  Review of Systems Unless otherwise noted, a complete review of systems is negative.  Physical Exam General: NAD, frail appearing Cardiovascular: regular rate and rhythm Pulmonary: clear ant fields Abdomen: soft, nontender, + bowel sounds GU: no suprapubic tenderness Extremities: Right BKA, left lower extremity with multiple foul-smelling wounds Skin: Wounds as noted above Neurological: Alert and oriented x 1  IMPRESSION: Patient seen and examined.  Introduced palliative care services and attempted establish therapeutic rapport.  Patient is currently confused.  He is oriented to person and knows he is in the hospital but thought he was in [place], [place].  He does describe lying on the floor  of his  home for several days prior to being found by [employer].  Patient says normally he is wheelchair-bound but able to care for himself.  I had a lengthy conversation with patient in an attempt to elicit friends or family who are involved in his care.  Patient says that he is independent and has no regular contact with friends or family.  He attempted to describe a friend but could not recall their name.  He was able to tell me a cousin, Arita Miss, who lives in [place], [place].  However, patient says it has been at least a year since he talked to her and he was not sure if she was still living.  Patient recognizes that he has severe wounds to the left lower extremity.  He does think he might be interested in amputation if it were felt to be clinically prudent.  Case discussed with care management.  Recommend APS referral with consideration of guardianship.  Returning home does not seem like a safe disposition.  Patient does not appear to have a payer source for long-term care.  I tried an IT consultant but was unable to locate patient's cousin.  PLAN: -Continue current scope of treatment -CM/SW referral -We will follow    Time Total: 60 minutes  Visit consisted of counseling and education dealing with the complex and emotionally intense issues of symptom management and palliative care in the setting of serious and potentially life-threatening illness.Greater than 50%  of this time was spent counseling and coordinating care related to the above assessment and plan.  Signed by: Altha Harm, PhD, NP-C [phone] (Work Cell)

## 2018-08-22 ENCOUNTER — Inpatient Hospital Stay: Payer: Medicare Other

## 2018-08-22 DIAGNOSIS — L89153 Pressure ulcer of sacral region, stage 3: Secondary | ICD-10-CM

## 2018-08-22 LAB — CBC
HCT: 42.6 % (ref 39.0–52.0)
Hemoglobin: 13.2 g/dL (ref 13.0–17.0)
MCH: 26.9 pg (ref 26.0–34.0)
MCHC: 31 g/dL (ref 30.0–36.0)
MCV: 86.9 fL (ref 80.0–100.0)
Platelets: 209 10*3/uL (ref 150–400)
RBC: 4.9 MIL/uL (ref 4.22–5.81)
RDW: 13.7 % (ref 11.5–15.5)
WBC: 11.5 10*3/uL — ABNORMAL HIGH (ref 4.0–10.5)
nRBC: 0 % (ref 0.0–0.2)

## 2018-08-22 LAB — CK: Total CK: 261 U/L (ref 49–397)

## 2018-08-22 LAB — BASIC METABOLIC PANEL
Anion gap: 9 (ref 5–15)
BUN: 38 mg/dL — ABNORMAL HIGH (ref 8–23)
CO2: 21 mmol/L — ABNORMAL LOW (ref 22–32)
Calcium: 8.3 mg/dL — ABNORMAL LOW (ref 8.9–10.3)
Chloride: 120 mmol/L — ABNORMAL HIGH (ref 98–111)
Creatinine, Ser: 0.96 mg/dL (ref 0.61–1.24)
GFR calc Af Amer: >60 mL/min (ref >60)
GFR calc non Af Amer: >60 mL/min (ref >60)
Glucose, Bld: 162 mg/dL — ABNORMAL HIGH (ref 70–99)
Potassium: 3.8 mmol/L (ref 3.5–5.1)
Sodium: 150 mmol/L — ABNORMAL HIGH (ref 135–145)

## 2018-08-22 LAB — GLUCOSE, CAPILLARY
Glucose-Capillary: 152 mg/dL — ABNORMAL HIGH (ref 70–99)
Glucose-Capillary: 140 mg/dL — ABNORMAL HIGH (ref 70–99)
Glucose-Capillary: 157 mg/dL — ABNORMAL HIGH (ref 70–99)
Glucose-Capillary: 160 mg/dL — ABNORMAL HIGH (ref 70–99)

## 2018-08-22 MED ORDER — DEXTROSE-NACL 5-0.45 % IV SOLN
INTRAVENOUS | Status: DC
  Administered 2018-08-22 – 2018-08-24 (×6): via INTRAVENOUS

## 2018-08-22 MED ORDER — SODIUM CHLORIDE 0.9 % IV BOLUS
500.0000 mL | Freq: Once | INTRAVENOUS | Status: AC
  Administered 2018-08-22: 500 mL via INTRAVENOUS

## 2018-08-22 MED ORDER — STARCH (THICKENING) PO POWD
ORAL | Status: DC | PRN
  Filled 2018-08-22: qty 227

## 2018-08-22 MED ORDER — ASPIRIN 325 MG PO TABS
325.0000 mg | ORAL_TABLET | Freq: Every day | ORAL | Status: DC
  Administered 2018-08-22 – 2018-08-31 (×8): 325 mg via ORAL
  Filled 2018-08-22 (×11): qty 1

## 2018-08-22 MED ORDER — PANTOPRAZOLE SODIUM 40 MG IV SOLR
40.0000 mg | Freq: Two times a day (BID) | INTRAVENOUS | Status: DC
  Administered 2018-08-22 – 2018-08-29 (×14): 40 mg via INTRAVENOUS
  Filled 2018-08-22 (×14): qty 40

## 2018-08-22 NOTE — Evaluation (Signed)
Clinical/Bedside Swallow Evaluation Patient Details  Name: Spencer Edwards MRN: [id number] Date of Birth: 08/28/1940  Today's Date: 08/22/2018 Time: SLP Start Time (ACUTE ONLY): 1055 SLP Stop Time (ACUTE ONLY): 1155 SLP Time Calculation (min) (ACUTE ONLY): 60 min  Past Medical History:  Past Medical History:  Diagnosis Date  . Acute kidney failure (HCC)   . Anxiety disorder   . Coronary artery disease    Non-ST elevation myocardial infarction in November 2015 in the setting of septic shock and multiorgan failure. Peak troponin was 25. Echo showed normal LV systolic function with inferior and posterior hypokinesis.  . Diabetes mellitus without complication (HCC)   . Dysphagia   . Gastro-esophageal reflux   . Hematuria   . Hyperlipidemia   . Hypernatremia   . Hypertension   . MI (myocardial infarction) (HCC)   . Muscle weakness   . PVD (peripheral vascular disease) (HCC)   . Rhabdomyolysis   . Urinary retention    Past Surgical History:  Past Surgical History:  Procedure Laterality Date  . ABDOMINAL SURGERY    . leg amputation     HPI:  Pt is a 78 y.o. 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  Patient reportedly fell out of bed 4 days ago and crawled to the kitchen and tried to call 911 but was unable to stand.  He denies any chest pain.  Feels weak and is complaining of thirst.  EMS took pictures of his house which was an incredible state of disrepair.  APS report was filed.  Patient states he is not taking any of his medications for 4 days. Pt denies any swallowing problems but states he "hiccups", maybe belches.  He was eating regular food w/ [employer], and drinking thin liquids at home.  CXR at  admission: No active disease.  Of note, NSG reported pt has been lethargic and "not really eating much" since admission.    Assessment / Plan / Recommendation Clinical Impression  Pt appears to present w/ oropharyngeal phase dysphagia w/ increased risk for aspiration thus Pulmonary decline. There is currently a suspicion of dysphagia but pt is on a regular diet. Noted old h/o of dysphagia possibly during hospitalization in 2015(?) - no further chart h/o dysphagia noted. Pt does have baseline dx of GERD. Pt required full positioning for upright positioning for po trials - he appears weak and deconditioned. He requires verbal cues to follow through w/ tasks, po tasks - noted pt often closed eyes though still verbally communicating w/ SLP. His responses and follow through were appropriate; no severe Cognitive decline noted in his behavior. Pt assisted w/ trials of ice chips, thin liquids via cup, Nectar liquids and Purees - no trials of solid foods were presented d/t poor condition/missing Dentition. Pt consumed trials of ice chips, Nectar liquids, and purees w/ no overt s/s of aspiration noted. However, during trials of thin liquids, pt exhibited a delayed cough and throat clearing then again post Belching/Hiccup and chewing behavior; pt closed eyes and seemed less alert for several seconds though he verbally responded. NSG notified and indicated ANS change in HR and rhythm. Pt was fed TSP trials of the Nectar liquids and purees w/ no such behavior noted;  no overt s/s of aspiration or decline in status noted. His vocal quality always remained clear; laryngeal excursion was grossly wfl when palpated; no decline in respiratory status during/post the po trials given at the eval. [facility] bolus management and A-P transfer; oral clearing followed post each trial given. Pt was fed d/t overall weakness but he helped to hold cup when drinking(UE weakness noted). OM exam grossly WFL; speech was  clear. Recommend modification of diet to a Dysphagia level 1 (puree) w/ Nectar consistency liquids d/t overall presentation and increased risk for aspiration in such a declined presentation; pt does have a h/o dysphagia per chart notes(?). Recommend aspiration precautions; feeding support and Supervision w/ all oral intake; Pills in puree Whole vs Crushed for safer swallowing.  SLP Visit Diagnosis: Dysphagia, oropharyngeal phase (R13.12)(missing dentition)    Aspiration Risk  Mild aspiration risk;Moderate aspiration risk;Risk for inadequate nutrition/hydration    Diet Recommendation  Dysphagia level 1 (puree) w/ NECTAR consistency liquids; aspiration precautions; feeding support at meals. Reflux precautions. Feeding support at meals.   Medication Administration: Whole meds with puree(or Crushed in puree as needed)    Other  Recommendations Recommended Consults: (Dietician f/u) Oral Care Recommendations: Oral care BID;Staff/trained caregiver to provide oral care Other Recommendations: Order thickener from pharmacy;Prohibited food (jello, ice cream, thin soups);Remove water pitcher;Have oral suction available   Follow up Recommendations Skilled Nursing facility(TBD)      Frequency and Duration min 3x week  2 weeks       Prognosis Prognosis for Safe Diet Advancement: Fair Barriers to Reach Goals: Cognitive deficits;Time post onset;Behavior(currently)      Swallow Study   General Date of Onset: 08/20/18 HPI: Pt is a 78 y.o. 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  Patient reportedly fell out of bed 4 days ago and crawled to the kitchen and tried to call 911 but was unable to stand.  He denies any chest pain.  Feels  weak and is complaining of thirst.  EMS took pictures of his house which was an incredible state of disrepair.  APS report was filed.  Patient states he is not taking any of his medications for 4 days. Pt denies any swallowing problems but states he "hiccups", maybe belches.  He was eating regular food w/ [employer], and drinking thin liquids at home.  CXR at admission: No active disease.  Of note, NSG reported pt has been lethargic and "not really eating much" since admission.  Type of Study: Bedside Swallow Evaluation Previous Swallow Assessment: dysphagia in his PMH (2015?); no recent issues per pt Diet Prior to this Study: Regular;Thin liquids(per MD at admission) Temperature Spikes Noted: No(wbc 11.5 from 14.1 at admission) Respiratory Status: Nasal cannula(1 liter) History of Recent Intubation: No Behavior/Cognition: Alert;Cooperative;Pleasant mood;Distractible;Requires cueing(eyes closed often - needed cues) Oral Cavity Assessment: Dry Oral Care Completed by SLP: Yes Oral Cavity - Dentition: Poor condition;Missing dentition Vision: (n/a) Self-Feeding Abilities: Total assist Patient Positioning: Upright in bed(needed full positioning) Baseline Vocal Quality: Normal;Low vocal intensity(wnl) Volitional Cough: Strong Volitional Swallow: Able to elicit    Oral/Motor/Sensory Function Overall Oral Motor/Sensory Function: Within functional limits(grossly; speech clear)   Ice Chips Ice chips: Within functional limits Presentation:  Spoon(fed; 3 trials) Other Comments: unable to fully masticate d/t missing dentition   Thin Liquid Thin Liquid: Impaired Presentation: Cup;Self Fed(assisted; 4 trials; 3 trials) Oral Phase Impairments: (poor awareness of cup to mouth) Oral Phase Functional Implications: (none) Pharyngeal  Phase Impairments: Cough - Delayed;Throat Clearing - Delayed(x1 each and pot post belching) Other Comments: belching, hiccup w/ a chewing behavior followed the belching     Nectar Thick Nectar Thick Liquid: Within functional limits Presentation: Spoon(fed; 8 trials) Other Comments: vocal quality appropriate; no belching, hiccup   Honey Thick Honey Thick Liquid: Not tested   Puree Puree: Within functional limits Presentation: Spoon(fed; 8 trials) Other Comments: similar to trials of nectar liquids   Solid     Solid: Not tested Other Comments: missing dentition       Orinda Kenner, MS, CCC-SLP Watson,Katherine 08/22/2018,12:53 PM

## 2018-08-22 NOTE — Progress Notes (Signed)
CC: decubitus Subjective: No new issues AMp for Monday  Objective: Vital signs in last 24 hours: Temp:  [98 F (36.7 C)-98.6 F (37 C)] 98.6 F (37 C) (07/11 0909) Pulse Rate:  [64-85] 76 (07/11 0909) Resp:  [18-20] 20 (07/10 2019) BP: (91-119)/(55-74) 104/55 (07/11 0909) SpO2:  [94 %-98 %] 97 % (07/11 0909) Last BM Date: 08/19/18  Intake/Output from previous day: 07/10 0701 - 07/11 0700 In: 1707.9 [I.V.:1537.5; IV Piggyback:170.4] Out: -  Intake/Output this shift: No intake/output data recorded.  Physical exam: Debilitated elderly male NAD Abd: soft, nt Neuro: GCS 15, no focal deficits  Lab Results: CBC  Recent Labs    08/21/18 0535 08/22/18 0531  WBC 12.4* 11.5*  HGB 15.2 13.2  HCT 49.5 42.6  PLT 285 209   BMET Recent Labs    08/21/18 0535 08/22/18 0531  NA 149* 150*  K 4.1 3.8  CL 118* 120*  CO2 22 21*  GLUCOSE 343* 162*  BUN 56* 38*  CREATININE 1.16 0.96  CALCIUM 8.8* 8.3*   PT/INR No results for input(s): LABPROT, INR in the last 72 hours. ABG No results for input(s): PHART, HCO3 in the last 72 hours.  Invalid input(s): PCO2, PO2  Studies/Results: Ct Head Wo Contrast  Result Date: 08/20/2018 CLINICAL DATA:  Status post fall, found down. Multiple soft tissue wounds in pressure sores. EXAM: CT HEAD WITHOUT CONTRAST TECHNIQUE: Contiguous axial images were obtained from the base of the skull through the vertex without intravenous contrast. COMPARISON:  Head CT dated 12/30/2013. FINDINGS: Brain: Generalized age related parenchymal volume loss with commensurate dilatation of the ventricles and sulci. No mass, hemorrhage, edema or other evidence of acute parenchymal abnormality. No extra-axial hemorrhage. Vascular: No hyperdense vessel or unexpected calcification. Skull: Normal. Negative for fracture or focal lesion. Sinuses/Orbits: No acute finding. Other: None. IMPRESSION: No acute findings in the brain. No evidence of intracranial mass, hemorrhage or  edema. Electronically Signed   By: Franki Cabot M.D.   On: 08/20/2018 14:06   Dg Chest Port 1 View  Result Date: 08/20/2018 CLINICAL DATA:  Fall. EXAM: PORTABLE CHEST 1 VIEW COMPARISON:  Radiograph of January 12, 2014. FINDINGS: The heart size and mediastinal contours are within normal limits. Both lungs are clear. No pneumothorax or pleural effusion is noted. The visualized skeletal structures are unremarkable. IMPRESSION: No active disease. Electronically Signed   By: Marijo Conception M.D.   On: 08/20/2018 13:29   Dg Foot Complete Left  Result Date: 08/20/2018 CLINICAL DATA:  Open wound, concern for fracture/osteomyelitis EXAM: LEFT FOOT - COMPLETE 3+ VIEW COMPARISON:  None. FINDINGS: There is an open wound of the tip of the left great toe with gross bony destruction of the exposed tip of the distal phalanx, consistent with osteomyelitis. There is a single interphalangeal joint of the second digit with dislocation of the distal phalanx. There may be additional bony erosion of the distal aspect of the proximal left fourth phalanx. There is diffuse soft tissue edema about the left foot with additional open wounds about the dorsum of the foot. IMPRESSION: There is an open wound of the tip of the left great toe with gross bony destruction of the exposed tip of the distal phalanx, consistent with osteomyelitis. There is a single interphalangeal joint of the second digit with dislocation of the distal phalanx. There may be additional bony erosion of the distal aspect of the proximal left fourth phalanx. There is diffuse soft tissue edema about the left foot with additional  open wounds about the dorsum of the foot. Contrast enhanced MRI may be helpful to evaluate for extent of multifocal osteomyelitis. Electronically Signed   By: Lauralyn PrimesAlex  Bibbey M.D.   On: 08/20/2018 13:56    Anti-infectives: Anti-infectives (From admission, onward)   Start     Dose/Rate Route Frequency Ordered Stop   08/22/18 0800  cefTRIAXone  (ROCEPHIN) 2 g in sodium chloride 0.9 % 100 mL IVPB     2 g 200 mL/hr over 30 Minutes Intravenous Every 24 hours 08/21/18 2241     08/21/18 1600  vancomycin (VANCOCIN) 1,500 mg in sodium chloride 0.9 % 500 mL IVPB  Status:  Discontinued     1,500 mg 250 mL/hr over 120 Minutes Intravenous Every 24 hours 08/21/18 1200 08/21/18 1219   08/21/18 1600  vancomycin (VANCOCIN) 1,500 mg in sodium chloride 0.9 % 500 mL IVPB     1,500 mg 250 mL/hr over 120 Minutes Intravenous Every 24 hours 08/21/18 1219     08/21/18 1400  metroNIDAZOLE (FLAGYL) IVPB 500 mg     500 mg 100 mL/hr over 60 Minutes Intravenous Every 8 hours 08/21/18 1148     08/20/18 2200  ceFEPIme (MAXIPIME) 2 g in sodium chloride 0.9 % 100 mL IVPB  Status:  Discontinued     2 g 200 mL/hr over 30 Minutes Intravenous Every 12 hours 08/20/18 1756 08/21/18 2241   08/20/18 1800  vancomycin (VANCOCIN) IVPB 1000 mg/200 mL premix  Status:  Discontinued     1,000 mg 200 mL/hr over 60 Minutes Intravenous Every 24 hours 08/20/18 1756 08/21/18 1200   08/20/18 1530  vancomycin (VANCOCIN) IVPB 1000 mg/200 mL premix  Status:  Discontinued     1,000 mg 200 mL/hr over 60 Minutes Intravenous Every 2 hours 08/20/18 1334 08/20/18 1754   08/20/18 1330  ceFEPIme (MAXIPIME) 2 g in sodium chloride 0.9 % 100 mL IVPB     2 g 200 mL/hr over 30 Minutes Intravenous  Once 08/20/18 1325 08/20/18 1430   08/20/18 1330  metroNIDAZOLE (FLAGYL) IVPB 500 mg     500 mg 100 mL/hr over 60 Minutes Intravenous  Once 08/20/18 1325 08/20/18 1648   08/20/18 1330  vancomycin (VANCOCIN) IVPB 1000 mg/200 mL premix  Status:  Discontinued     1,000 mg 200 mL/hr over 60 Minutes Intravenous  Once 08/20/18 1325 08/20/18 1334      Assessment/Plan:  Decubitus, pressure relief , mobilization nutritional support Most important issues is Left leg No debridement required at this time We will be availble   Sterling Bigiego Gohan Collister, MD, FACS  08/22/2018

## 2018-08-22 NOTE — Progress Notes (Signed)
[facility] [place] NAME: Spencer Edwards    MR#:  [id number]  DATE OF BIRTH:  [birth year]  SUBJECTIVE:  CHIEF COMPLAINT:   Chief Complaint  Patient presents with  . Fall  Patient seen today On oxygen via nasal cannula No complaints of chest pain No complains of any shortness of breath Had an episode of jerking movements of the jaw questionable seizure Had runs of asystole on the telemetry monitor Poor historian  REVIEW OF SYSTEMS:    ROS  CONSTITUTIONAL: No documented fever. Has fatigue, weakness. No weight gain, no weight loss.  EYES: No blurry or double vision.  ENT: No tinnitus. No postnasal drip. No redness of the oropharynx.  RESPIRATORY: No cough, no wheeze, no hemoptysis. No dyspnea.  CARDIOVASCULAR: No chest pain. No orthopnea. No palpitations. No syncope.  GASTROINTESTINAL: No nausea, no vomiting or diarrhea. No abdominal pain. Has melena GENITOURINARY: No dysuria or hematuria.  ENDOCRINE: No polyuria or nocturia. No heat or cold intolerance.  HEMATOLOGY: No anemia. No bruising. No bleeding.  INTEGUMENTARY: Has decubitus ulcer Has non healing ulcer on the leg.  MUSCULOSKELETAL: One leg amputated NEUROLOGIC: No numbness, tingling, or ataxia. No seizure-type activity.  PSYCHIATRIC: No anxiety. No insomnia. No ADD.   DRUG ALLERGIES:  No Known Allergies  VITALS:  Blood pressure (!) 100/55, pulse 77, temperature 98.6 F (37 C), temperature source Oral, resp. rate 20, height 5\' 10"  (1.778 m), weight 83 kg, SpO2 98 %.  PHYSICAL EXAMINATION:   Physical Exam  GENERAL:  78 y.o.-year-old patient lying in the bed with no acute distress.  EYES: Pupils equal, round, reactive to light and accommodation. No scleral icterus. Extraocular muscles intact.  HEENT: Head atraumatic, normocephalic. Oropharynx and nasopharynx clear.  NECK:  Supple, no jugular venous distention. No thyroid enlargement, no tenderness.  LUNGS: Normal  breath sounds bilaterally, no wheezing, rales, rhonchi. No use of accessory muscles of respiration.  CARDIOVASCULAR: S1, S2 normal. No murmurs, rubs, or gallops.  ABDOMEN: Soft, nontender, nondistended. Bowel sounds present. No organomegaly or mass.  EXTREMITIES: No cyanosis, clubbing  Prior amputation right leg NEUROLOGIC: Cranial nerves II through XII are intact. No focal Motor or sensory deficits b/l.   PSYCHIATRIC: The patient is alert and oriented x 3.  SKIN: Decubitus ulcer Ulcer left leg, left foot               LABORATORY PANEL:   CBC Recent Labs  Lab 08/22/18 0531  WBC 11.5*  HGB 13.2  HCT 42.6  PLT 209   ------------------------------------------------------------------------------------------------------------------ Chemistries  Recent Labs  Lab 08/20/18 1314  08/22/18 0531  NA 143   < > 150*  K 4.7   < > 3.8  CL 103   < > 120*  CO2 24   < > 21*  GLUCOSE 492*   < > 162*  BUN 75*   < > 38*  CREATININE 1.50*   < > 0.96  CALCIUM 9.9   < > 8.3*  AST 52*  --   --   ALT 52*  --   --   ALKPHOS 88  --   --   BILITOT 1.7*  --   --    < > = values in this interval not displayed.   ------------------------------------------------------------------------------------------------------------------  Cardiac Enzymes No results for input(s): TROPONINI in the last 168 hours. ------------------------------------------------------------------------------------------------------------------  RADIOLOGY:  Ct Head Wo Contrast  Result Date: 08/20/2018 CLINICAL DATA:  Status post fall, found down. Multiple soft  tissue wounds in pressure sores. EXAM: CT HEAD WITHOUT CONTRAST TECHNIQUE: Contiguous axial images were obtained from the base of the skull through the vertex without intravenous contrast. COMPARISON:  Head CT dated 12/30/2013. FINDINGS: Brain: Generalized age related parenchymal volume loss with commensurate dilatation of the ventricles and sulci. No mass,  hemorrhage, edema or other evidence of acute parenchymal abnormality. No extra-axial hemorrhage. Vascular: No hyperdense vessel or unexpected calcification. Skull: Normal. Negative for fracture or focal lesion. Sinuses/Orbits: No acute finding. Other: None. IMPRESSION: No acute findings in the brain. No evidence of intracranial mass, hemorrhage or edema. Electronically Signed   By: Bary RichardStan  Maynard M.D.   On: 08/20/2018 14:06   Dg Chest Port 1 View  Result Date: 08/20/2018 CLINICAL DATA:  Fall. EXAM: PORTABLE CHEST 1 VIEW COMPARISON:  Radiograph of January 12, 2014. FINDINGS: The heart size and mediastinal contours are within normal limits. Both lungs are clear. No pneumothorax or pleural effusion is noted. The visualized skeletal structures are unremarkable. IMPRESSION: No active disease. Electronically Signed   By: Lupita RaiderJames  Green Jr M.D.   On: 08/20/2018 13:29   Dg Foot Complete Left  Result Date: 08/20/2018 CLINICAL DATA:  Open wound, concern for fracture/osteomyelitis EXAM: LEFT FOOT - COMPLETE 3+ VIEW COMPARISON:  None. FINDINGS: There is an open wound of the tip of the left great toe with gross bony destruction of the exposed tip of the distal phalanx, consistent with osteomyelitis. There is a single interphalangeal joint of the second digit with dislocation of the distal phalanx. There may be additional bony erosion of the distal aspect of the proximal left fourth phalanx. There is diffuse soft tissue edema about the left foot with additional open wounds about the dorsum of the foot. IMPRESSION: There is an open wound of the tip of the left great toe with gross bony destruction of the exposed tip of the distal phalanx, consistent with osteomyelitis. There is a single interphalangeal joint of the second digit with dislocation of the distal phalanx. There may be additional bony erosion of the distal aspect of the proximal left fourth phalanx. There is diffuse soft tissue edema about the left foot with  additional open wounds about the dorsum of the foot. Contrast enhanced MRI may be helpful to evaluate for extent of multifocal osteomyelitis. Electronically Signed   By: Lauralyn PrimesAlex  Bibbey M.D.   On: 08/20/2018 13:56     ASSESSMENT AND PLAN:  78 year old male patient with history of diabetes mellitus type 2, coronary disease, non-STEMI,, diabetes mellitus type 2, GERD, hyperlipidemia, hypertension, peripheral vascular disease, rhabdomyolysis currently under hospitalist service  -Sepsis secondary to left leg infection May need amputation Vascular surgery consult Continue IV vancomycin and Zosyn antibiotics Added IV Flagyl antibiotic Infectious disease consult Follow-up cultures and lactic acid level Aerobic cultures sent from the left foot  -Elevated troponin Abnormal rhythms on telemetry monitor Could be from demand ischemia from sepsis Has setting of GI bleed hence anticoagulation could not be done Continue beta-blocker Statin cannot be given secondary to rhabdomyolysis Cardiology consult appreciated Follow-up with cardiology who did not recommend any acute intervention 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% Cardiac monitoring to continue  -Acute rhabdomyolysis improving CK levels improved Continue to hold statin medication  -Acute hypernatremia IV fluids switched to D5 normal saline Monitor electrolytes  -Acute gastrointestinal bleeding Serial hemoglobin hematocrit monitoring IV Protonix drip Gastroenterology consult Interventions might be not possible secondary to multiple medical problems and patient cannot tolerated  -Acute  renal insufficiency Monitor renal function with IV fluids Avoid nephrotoxic medication  -Decubitus ulcer infected Wound care consult Surgery consult would debridement Broad-spectrum antibiotics  -Seizure-like activity Check MRI brain to assess for any intracranial pathology  -Palliative  care consult To address goals of care  -Long-term prognosis poor  -DNR by CODE STATUS  All the records are reviewed and case discussed with Care Management/Social Worker. Management plans discussed with the patient, family and they are in agreement.  CODE STATUS: DNR  DVT Prophylaxis: SCDs  TOTAL TIME TAKING CARE OF THIS PATIENT: 36 minutes.   POSSIBLE D/C IN 3 to 4 DAYS, DEPENDING ON CLINICAL CONDITION.  Ihor AustinPavan  M.D on 08/22/2018 at 1:20 PM  Between 7am to 6pm - Pager - [phone]  After 6pm go to [email or web address] - password [email or web address]  [facility]  Office  [phone]  CC: Primary care physician; Patient, No Pcp Per  Note: This dictation was prepared with Dragon dictation along with smaller phrase technology. Any transcriptional errors that result from this process are unintentional.

## 2018-08-22 NOTE — Progress Notes (Signed)
MRI brain reviewed Shows  Frontal cva Neuro check q 4 hrly Transfer to 1c Neurology consult Start oral aspirin Statin cannot be give as patient recovering from rhabdomyolysis. Check carotid USD Patient has multiple medical problems sepsis, gangrene of foot, PVD, decubius ulcer, renal failure, Anemia . Prognosis poor DNR by code status.

## 2018-08-22 NOTE — Progress Notes (Signed)
CCMD called to notify me that pt had a 10 second pause on heart monitor. I went to his room immediately and the speech therapist was in the room and said his eyes just rolled back in his head for a few seconds just prior to me coming in. Pt was alert but back to his baseline. Took a set of vitals and all was at his baseline. Dr Estanislado Pandy called and notified. Orders received.

## 2018-08-23 ENCOUNTER — Inpatient Hospital Stay: Payer: Medicare Other

## 2018-08-23 DIAGNOSIS — K921 Melena: Secondary | ICD-10-CM

## 2018-08-23 LAB — CBC
HCT: 40.1 % (ref 39.0–52.0)
Hemoglobin: 12.7 g/dL — ABNORMAL LOW (ref 13.0–17.0)
MCH: 27.3 pg (ref 26.0–34.0)
MCHC: 31.7 g/dL (ref 30.0–36.0)
MCV: 86.2 fL (ref 80.0–100.0)
Platelets: 185 10*3/uL (ref 150–400)
RBC: 4.65 MIL/uL (ref 4.22–5.81)
RDW: 13.7 % (ref 11.5–15.5)
WBC: 9.2 10*3/uL (ref 4.0–10.5)
nRBC: 0 % (ref 0.0–0.2)

## 2018-08-23 LAB — BASIC METABOLIC PANEL
Anion gap: 4 — ABNORMAL LOW (ref 5–15)
BUN: 23 mg/dL (ref 8–23)
CO2: 24 mmol/L (ref 22–32)
Calcium: 7.9 mg/dL — ABNORMAL LOW (ref 8.9–10.3)
Chloride: 118 mmol/L — ABNORMAL HIGH (ref 98–111)
Creatinine, Ser: 0.86 mg/dL (ref 0.61–1.24)
GFR calc Af Amer: >60 mL/min (ref >60)
GFR calc non Af Amer: >60 mL/min (ref >60)
Glucose, Bld: 257 mg/dL — ABNORMAL HIGH (ref 70–99)
Potassium: 3.6 mmol/L (ref 3.5–5.1)
Sodium: 146 mmol/L — ABNORMAL HIGH (ref 135–145)

## 2018-08-23 LAB — CULTURE, BLOOD (ROUTINE X 2): Special Requests: ADEQUATE

## 2018-08-23 LAB — GLUCOSE, CAPILLARY
Glucose-Capillary: 227 mg/dL — ABNORMAL HIGH (ref 70–99)
Glucose-Capillary: 285 mg/dL — ABNORMAL HIGH (ref 70–99)
Glucose-Capillary: 365 mg/dL — ABNORMAL HIGH (ref 70–99)
Glucose-Capillary: 310 mg/dL — ABNORMAL HIGH (ref 70–99)

## 2018-08-23 LAB — GLUCOSE, POCT (MANUAL RESULT ENTRY): POC Glucose: 219 mg/dl — AB (ref 70–99)

## 2018-08-23 LAB — VANCOMYCIN, PEAK: Vancomycin Pk: 32 ug/mL (ref 30–40)

## 2018-08-23 MED ORDER — GUAIFENESIN-DM 100-10 MG/5ML PO SYRP
5.0000 mL | ORAL_SOLUTION | ORAL | Status: DC | PRN
  Administered 2018-08-23: 01:00:00 5 mL via ORAL
  Filled 2018-08-23: qty 5

## 2018-08-23 NOTE — Progress Notes (Signed)
Consult placed for piv. Upon arrival pt nurse at bedside and states pt needs another piv just in case it is needed. Explained to RN that 2nd piv could be started if pt had incompatible meds or there was a physician order for a 2nd site. Bedside RN stated 2nd site was not needed at this time. RN to notify IV Team if any changes for need of 2nd site. Randol Kern, RN VAST

## 2018-08-23 NOTE — Consult Note (Signed)
Reason for Consult:stroke Referring Physician: Pyreddy  CC: stroke  HPI:  Patient is a poor historian. No family at bedside. HP per notes  " Patient presented to the emergency department on 08/20/2018 brought in by EMS after being found by [employer] on his home kitchen floor covered in melanotic stool and urine.  Patient reportedly had fallen out of bed 4 days ago and crawled to the kitchen and tried to call 911 but was unable to stand.  He did not have any chest pain.  He feels weak and was complaining of thirst.His friend called EMS.As per the ED note patient has not been taking any medication for the past 4 days. In the ED his vitals were blood pressure of 112/66, heart rate of 104,Temperature of 97.3. Labs revealed a creatinine of 1.50, glucose of 492, AST of 52, ALT of 321, CK of 1107, troponin of 13.7And a lactate of 2.9.  WBC was 14.9, hemoglobin was 17.1, hematocrit was 53.6, platelet was 390.  EKG showed diffuse ST changes. The diagnosis was clinical sepsis with left leg infection and he was started on vancomycin and Zosyn.  Acute myocardial infarction was questioned because the troponin of 13 ,722 and cardiology was consulted.  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 Neurology consulted for stroke. Initial head ct w/o acute abnlities. Per notes there was on episode of jerking mvts of the jaw concerning for sz  ECHO 7/10: EF 35-40 %  Patient followed by ID, GI, Vascular surgery.   Past Medical History:  Diagnosis Date  . Acute kidney failure (HCC)   . Anxiety disorder   . Coronary artery disease    Non-ST elevation myocardial infarction in November 2015 in the setting of septic shock and multiorgan failure. Peak troponin was 25. Echo showed normal LV systolic function with inferior and posterior hypokinesis.  . Diabetes mellitus  without complication (HCC)   . Dysphagia   . Gastro-esophageal reflux   . Hematuria   . Hyperlipidemia   . Hypernatremia   . Hypertension   . MI (myocardial infarction) (HCC)   . Muscle weakness   . PVD (peripheral vascular disease) (HCC)   . Rhabdomyolysis   . Urinary retention     Past Surgical History:  Procedure Laterality Date  . ABDOMINAL SURGERY    . leg amputation      Family History  Problem Relation Age of Onset  . Heart Problems Father   . Heart disease Father     Social History:  reports that he has quit smoking. His smoking use included cigarettes. He has never used smokeless tobacco. He reports that he does not drink alcohol or use drugs.  No Known Allergies  Medications: I have reviewed the patient's current medications.  ROS: As per HPI Physical Examination: Blood pressure (!) 105/57, pulse 81, temperature 99.3 F (37.4 C), temperature source Oral, resp. rate 20, height  (1.778 m), weight 83 kg, SpO2 95 %.  Neurologic Examination Alert, awake, oriented x3, following commands CN: PERLA, EOMI, no nystgamus, vff difficult to assess, face symmetrical, face, face sensation seems nle, palate and tongue midline\ No motor deficit appreciated. Has BKA on the right. His left leg is infected and painful Sensory exam difficult to assess No coordination deficit appreciated  gait and DTR not checked.   Results for  orders placed or performed during the hospital encounter of 08/20/18 (from the past 48 hour(s))  Glucose, capillary     Status: Abnormal   Collection Time: 08/21/18 11:38 AM  Result Value Ref Range   Glucose-Capillary 240 (H) 70 - 99 mg/dL  Glucose, capillary     Status: Abnormal   Collection Time: 08/21/18  4:39 PM  Result Value Ref Range   Glucose-Capillary 124 (H) 70 - 99 mg/dL  Glucose, capillary     Status: Abnormal   Collection Time: 08/21/18 10:05 PM  Result Value Ref Range   Glucose-Capillary 129 (H) 70 - 99 mg/dL   Comment 1  Notify RN   CBC     Status: Abnormal   Collection Time: 08/22/18  5:31 AM  Result Value Ref Range   WBC 11.5 (H) 4.0 - 10.5 K/uL   RBC 4.90 4.22 - 5.81 MIL/uL   Hemoglobin 13.2 13.0 - 17.0 g/dL   HCT 16.1 09.6 - 04.5 %   MCV 86.9 80.0 - 100.0 fL   MCH 26.9 26.0 - 34.0 pg   MCHC 31.0 30.0 - 36.0 g/dL   RDW 40.9 81.1 - 91.4 %   Platelets 209 150 - 400 K/uL   nRBC 0.0 0.0 - 0.2 %    Comment: Performed at [facility], [address] [place], [place] [address]  Basic metabolic panel     Status: Abnormal   Collection Time: 08/22/18  5:31 AM  Result Value Ref Range   Sodium 150 (H) 135 - 145 mmol/L   Potassium 3.8 3.5 - 5.1 mmol/L   Chloride 120 (H) 98 - 111 mmol/L   CO2 21 (L) 22 - 32 mmol/L   Glucose, Bld 162 (H) 70 - 99 mg/dL   BUN 38 (H) 8 - 23 mg/dL   Creatinine, Ser 6.21 0.61 - 1.24 mg/dL   Calcium 8.3 (L) 8.9 - 10.3 mg/dL   GFR calc non Af Amer >60 >60 mL/min   GFR calc Af Amer >60 >60 mL/min   Anion gap 9 5 - 15    Comment: Performed at [facility], [address] [place], [place] [address]  CK     Status: None   Collection Time: 08/22/18  5:31 AM  Result Value Ref Range   Total CK 261 49 - 397 U/L    Comment: Performed at [facility], [address] [place], [place] [address]  Glucose, capillary     Status: Abnormal   Collection Time: 08/22/18  7:32 AM  Result Value Ref Range   Glucose-Capillary 152 (H) 70 - 99 mg/dL  Aerobic Culture (superficial specimen)     Status: None (Preliminary result)   Collection Time: 08/22/18 10:30 AM   Specimen: Leg; Wound  Result Value Ref Range   Specimen Description LEG LEFT    Special Requests NONE    Gram Stain      RARE WBC PRESENT, PREDOMINANTLY MONONUCLEAR NO ORGANISMS SEEN Performed at [facility], [address] [place], [place] [address]    Culture PENDING    Report Status PENDING   Glucose, capillary     Status: Abnormal   Collection Time: 08/22/18 12:00 PM  Result Value Ref  Range   Glucose-Capillary 140 (H) 70 - 99 mg/dL  Glucose, capillary     Status: Abnormal   Collection Time: 08/22/18  4:26 PM  Result Value Ref Range   Glucose-Capillary 157 (H) 70 - 99 mg/dL  Glucose, capillary  Status: Abnormal   Collection Time: 08/22/18 10:01 PM  Result Value Ref Range   Glucose-Capillary 160 (H) 70 - 99 mg/dL   Comment 1 Notify RN   CBC     Status: Abnormal   Collection Time: 08/23/18  5:24 AM  Result Value Ref Range   WBC 9.2 4.0 - 10.5 K/uL   RBC 4.65 4.22 - 5.81 MIL/uL   Hemoglobin 12.7 (L) 13.0 - 17.0 g/dL   HCT 98.140.1 19.139.0 - 47.852.0 %   MCV 86.2 80.0 - 100.0 fL   MCH 27.3 26.0 - 34.0 pg   MCHC 31.7 30.0 - 36.0 g/dL   RDW 29.513.7 62.111.5 - 30.815.5 %   Platelets 185 150 - 400 K/uL   nRBC 0.0 0.0 - 0.2 %    Comment: Performed at [facility], [address] [place], [place] [address]  Basic metabolic panel     Status: Abnormal   Collection Time: 08/23/18  5:24 AM  Result Value Ref Range   Sodium 146 (H) 135 - 145 mmol/L   Potassium 3.6 3.5 - 5.1 mmol/L   Chloride 118 (H) 98 - 111 mmol/L   CO2 24 22 - 32 mmol/L   Glucose, Bld 257 (H) 70 - 99 mg/dL   BUN 23 8 - 23 mg/dL   Creatinine, Ser 6.960.86 0.61 - 1.24 mg/dL   Calcium 7.9 (L) 8.9 - 10.3 mg/dL   GFR calc non Af Amer >60 >60 mL/min   GFR calc Af Amer >60 >60 mL/min   Anion gap 4 (L) 5 - 15    Comment: Performed at [facility], [address] [place], [place] [address]  POCT glucose (manual entry)     Status: Abnormal   Collection Time: 08/23/18  8:57 AM  Result Value Ref Range   POC Glucose 219 (A) 70 - 99 mg/dl    Recent Results (from the past 240 hour(s))  Blood Culture (routine x 2)     Status: None (Preliminary result)   Collection Time: 08/20/18  1:14 PM   Specimen: BLOOD  Result Value Ref Range Status   Specimen Description BLOOD LEFT ANTECUBITAL  Final   Special Requests   Final    BOTTLES DRAWN AEROBIC AND ANAEROBIC Blood Culture results may not be optimal due to an  inadequate volume of blood received in culture bottles   Culture   Final    NO GROWTH 3 DAYS Performed at [facility], [address] [place], [place] [address]    Report Status PENDING  Incomplete  Blood Culture (routine x 2)     Status: Abnormal (Preliminary result)   Collection Time: 08/20/18  1:29 PM   Specimen: BLOOD  Result Value Ref Range Status   Specimen Description BLOOD RIGHT ANTECUBITAL  Final   Special Requests   Final    BOTTLES DRAWN AEROBIC AND ANAEROBIC Blood Culture adequate volume   Culture  Setup Time   Final    Organism ID to follow GRAM POSITIVE COCCI ANAEROBIC BOTTLE ONLY CRITICAL RESULT CALLED TO, READ BACK BY AND VERIFIED WITH: SHEEMA HALLAJI ON 08/21/2018 AT 0524 QSD GRAM NEGATIVE RODS GRAM POSITIVE COCCI AEROBIC BOTTLE ONLY    Culture MORGANELLA MORGANII SUSCEPTIBILITIES TO FOLLOW  (A)  Final   Report Status PENDING  Incomplete   Organism ID, Bacteria MORGANELLA MORGANII  Final      Susceptibility   Morganella morganii - MIC*    AMPICILLIN >=32 RESISTANT Resistant     CEFAZOLIN >=64 RESISTANT  Resistant     CEFEPIME <=1 SENSITIVE Sensitive     CEFTAZIDIME <=1 SENSITIVE Sensitive     CEFTRIAXONE <=1 SENSITIVE Sensitive     CIPROFLOXACIN <=0.25 SENSITIVE Sensitive     GENTAMICIN <=1 SENSITIVE Sensitive     IMIPENEM 4 SENSITIVE Sensitive     TRIMETH/SULFA <=20 SENSITIVE Sensitive     AMPICILLIN/SULBACTAM 16 INTERMEDIATE Intermediate     PIP/TAZO Value in next row Sensitive      <=4 SENSITIVEPerformed at [facility], [address] [place], [place] [address]    * MORGANELLA MORGANII  Blood Culture ID Panel (Reflexed)     Status: Abnormal   Collection Time: 08/20/18  1:29 PM  Result Value Ref Range Status   Enterococcus species NOT DETECTED NOT DETECTED Final   Listeria monocytogenes NOT DETECTED NOT DETECTED Final   Staphylococcus species DETECTED (A) NOT DETECTED Final    Comment: Methicillin (oxacillin) susceptible coagulase  negative staphylococcus. Possible blood culture contaminant (unless isolated from more than one blood culture draw or clinical case suggests pathogenicity). No antibiotic treatment is indicated for blood  culture contaminants. CRITICAL RESULT CALLED TO, READ BACK BY AND VERIFIED WITH: SHEEMA HALLAJI ON 08/21/2018 AT 0524 QSD    Staphylococcus aureus (BCID) NOT DETECTED NOT DETECTED Final   Methicillin resistance NOT DETECTED NOT DETECTED Final   Streptococcus species NOT DETECTED NOT DETECTED Final   Streptococcus agalactiae NOT DETECTED NOT DETECTED Final   Streptococcus pneumoniae NOT DETECTED NOT DETECTED Final   Streptococcus pyogenes NOT DETECTED NOT DETECTED Final   Acinetobacter baumannii NOT DETECTED NOT DETECTED Final   Enterobacteriaceae species NOT DETECTED NOT DETECTED Final   Enterobacter cloacae complex NOT DETECTED NOT DETECTED Final   Escherichia coli NOT DETECTED NOT DETECTED Final   Klebsiella oxytoca NOT DETECTED NOT DETECTED Final   Klebsiella pneumoniae NOT DETECTED NOT DETECTED Final   Proteus species NOT DETECTED NOT DETECTED Final   Serratia marcescens NOT DETECTED NOT DETECTED Final   Haemophilus influenzae NOT DETECTED NOT DETECTED Final   Neisseria meningitidis NOT DETECTED NOT DETECTED Final   Pseudomonas aeruginosa NOT DETECTED NOT DETECTED Final   Candida albicans NOT DETECTED NOT DETECTED Final   Candida glabrata NOT DETECTED NOT DETECTED Final   Candida krusei NOT DETECTED NOT DETECTED Final   Candida parapsilosis NOT DETECTED NOT DETECTED Final   Candida tropicalis NOT DETECTED NOT DETECTED Final    Comment: Performed at [facility], [address] [place], [place] [address]  Blood Culture ID Panel (Reflexed)     Status: Abnormal   Collection Time: 08/20/18  1:29 PM  Result Value Ref Range Status   Enterococcus species NOT DETECTED NOT DETECTED Final   Listeria monocytogenes NOT DETECTED NOT DETECTED Final   Staphylococcus species DETECTED  (A) NOT DETECTED Final    Comment: Methicillin (oxacillin) resistant coagulase negative staphylococcus. Possible blood culture contaminant (unless isolated from more than one blood culture draw or clinical case suggests pathogenicity). No antibiotic treatment is indicated for blood  culture contaminants. CRITICAL RESULT CALLED TO, READ BACK BY AND VERIFIED WITH: SHEEMA HALLAJI ON 08/21/2018 AT 0524 QSD    Staphylococcus aureus (BCID) NOT DETECTED NOT DETECTED Final   Methicillin resistance DETECTED (A) NOT DETECTED Final    Comment: CRITICAL RESULT CALLED TO, READ BACK BY AND VERIFIED WITH: SHEEMA HALLAJI ON 08/21/2018 AT 0524 QSD    Streptococcus species NOT DETECTED NOT DETECTED Final   Streptococcus agalactiae NOT DETECTED NOT DETECTED Final   Streptococcus pneumoniae NOT  DETECTED NOT DETECTED Final   Streptococcus pyogenes NOT DETECTED NOT DETECTED Final   Acinetobacter baumannii NOT DETECTED NOT DETECTED Final   Enterobacteriaceae species NOT DETECTED NOT DETECTED Final   Enterobacter cloacae complex NOT DETECTED NOT DETECTED Final   Escherichia coli NOT DETECTED NOT DETECTED Final   Klebsiella oxytoca NOT DETECTED NOT DETECTED Final   Klebsiella pneumoniae NOT DETECTED NOT DETECTED Final   Proteus species NOT DETECTED NOT DETECTED Final   Serratia marcescens NOT DETECTED NOT DETECTED Final   Haemophilus influenzae NOT DETECTED NOT DETECTED Final   Neisseria meningitidis NOT DETECTED NOT DETECTED Final   Pseudomonas aeruginosa NOT DETECTED NOT DETECTED Final   Candida albicans NOT DETECTED NOT DETECTED Final   Candida glabrata NOT DETECTED NOT DETECTED Final   Candida krusei NOT DETECTED NOT DETECTED Final   Candida parapsilosis NOT DETECTED NOT DETECTED Final   Candida tropicalis NOT DETECTED NOT DETECTED Final    Comment: Performed at [facility], [address] [place], [place] [address]  SARS Coronavirus 2 (CEPHEID- Performed in [facility] lab), Hosp Order      Status: None   Collection Time: 08/20/18  1:36 PM   Specimen: Nasopharyngeal Swab  Result Value Ref Range Status   SARS Coronavirus 2 NEGATIVE NEGATIVE Final    Comment: (NOTE) If result is NEGATIVE SARS-CoV-2 target nucleic acids are NOT DETECTED. The SARS-CoV-2 RNA is generally detectable in upper and lower  respiratory specimens during the acute phase of infection. The lowest  concentration of SARS-CoV-2 viral copies this assay can detect is 250  copies / mL. A negative result does not preclude SARS-CoV-2 infection  and should not be used as the sole basis for treatment or other  patient management decisions.  A negative result may occur with  improper specimen collection / handling, submission of specimen other  than nasopharyngeal swab, presence of viral mutation(s) within the  areas targeted by this assay, and inadequate number of viral copies  (<250 copies / mL). A negative result must be combined with clinical  observations, patient history, and epidemiological information. If result is POSITIVE SARS-CoV-2 target nucleic acids are DETECTED. The SARS-CoV-2 RNA is generally detectable in upper and lower  respiratory specimens dur ing the acute phase of infection.  Positive  results are indicative of active infection with SARS-CoV-2.  Clinical  correlation with patient history and other diagnostic information is  necessary to determine patient infection status.  Positive results do  not rule out bacterial infection or co-infection with other viruses. If result is PRESUMPTIVE POSTIVE SARS-CoV-2 nucleic acids MAY BE PRESENT.   A presumptive positive result was obtained on the submitted specimen  and confirmed on repeat testing.  While 2019 novel coronavirus  (SARS-CoV-2) nucleic acids may be present in the submitted sample  additional confirmatory testing may be necessary for epidemiological  and / or clinical management purposes  to differentiate between  SARS-CoV-2 and  other Sarbecovirus currently known to infect humans.  If clinically indicated additional testing with an alternate test  methodology [phone]) is advised. The SARS-CoV-2 RNA is generally  detectable in upper and lower respiratory sp ecimens during the acute  phase of infection. The expected result is Negative. Fact Sheet for Patients:  StrictlyIdeas.no Fact Sheet for Healthcare Providers: BankingDealers.co.za This test is not yet approved or cleared by the [place] FDA and has been authorized for detection and/or diagnosis of SARS-CoV-2 by FDA under an Emergency Use Authorization (EUA).  This EUA will remain in  effect (meaning this test can be used) for the duration of the COVID-19 declaration under Section 564(b)(1) of the Act, 21 U.S.C. section 360bbb-3(b)(1), unless the authorization is terminated or revoked sooner. Performed at [facility], [address] [place], [place] [address]   Aerobic Culture (superficial specimen)     Status: None (Preliminary result)   Collection Time: 08/22/18 10:30 AM   Specimen: Leg; Wound  Result Value Ref Range Status   Specimen Description LEG LEFT  Final   Special Requests NONE  Final   Gram Stain   Final    RARE WBC PRESENT, PREDOMINANTLY MONONUCLEAR NO ORGANISMS SEEN Performed at [facility], [address] [place], [place] [address]    Culture PENDING  Incomplete   Report Status PENDING  Incomplete    Dg Abd 1 View  Result Date: 08/22/2018 CLINICAL DATA:  Metal screening prior to MRI. EXAM: ABDOMEN - 1 VIEW COMPARISON:  January 01, 2014 FINDINGS: Numerous surgical clips are seen throughout the abdomen. An anastomotic ring is seen in the right lower quadrant. No other metal identified in the abdomen or pelvis. No other acute abnormalities. IMPRESSION: Surgical clips and an anastomotic ring in the abdomen. No other metal seen in the abdomen or pelvis. Electronically  Signed   By: Gerome Samavid  Williams III M.D   On: 08/22/2018 13:28   Mr Brain Wo Contrast  Result Date: 08/22/2018 CLINICAL DATA:  Seizure, new.  Fall from bed 4 days ago. EXAM: MRI HEAD WITHOUT CONTRAST TECHNIQUE: Multiplanar, multiecho pulse sequences of the brain and surrounding structures were obtained without intravenous contrast. COMPARISON:  CT head without contrast 08/20/2018 FINDINGS: Brain: A 4 mm acute cortical infarct is present in posterior left frontal lobe. T2 signal changes are associated with the area of acute infarction. No other acute infarct is present. Mild generalized atrophy is present. There is relatively minimal white matter disease. The ventricles are proportionate to the degree of atrophy. No significant extraaxial fluid collection is present. The internal auditory canals are within normal limits. The brainstem and cerebellum are within normal limits. Vascular: Flow is present in the major intracranial arteries. Skull and upper cervical spine: The craniocervical junction is normal. Upper cervical spine is within normal limits. Marrow signal is unremarkable. Sinuses/Orbits: The paranasal sinuses are clear. There is some fluid in the right mastoid air cells. No obstructing nasopharyngeal lesion is present. The globes and orbits are within normal limits. IMPRESSION: 1. 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 2. Otherwise normal MRI appearance of the brain for age. 3. Minimal right mastoid effusion. No obstructing nasopharyngeal lesion is present. This was not present on the prior CT. Electronically Signed   By: Marin Robertshristopher  Mattern M.D.   On: 08/22/2018 15:04   [place] Carotid Bilateral  Result Date: 08/23/2018 CLINICAL DATA:  CVA. History of hypertension, CAD, hyperlipidemia, diabetes and smoking. EXAM: BILATERAL CAROTID DUPLEX ULTRASOUND TECHNIQUE: Wallace CullensGray scale imaging, color Doppler and duplex ultrasound were performed of bilateral carotid and vertebral arteries in  the neck. COMPARISON:  01/10/2011 FINDINGS: Criteria: Quantification of carotid stenosis is based on velocity parameters that correlate the residual internal carotid diameter with NASCET-based stenosis levels, using the diameter of the distal internal carotid lumen as the denominator for stenosis measurement. The following velocity measurements were obtained: RIGHT ICA: 48/11 cm/sec CCA: 64/9 cm/sec SYSTOLIC ICA/CCA RATIO:  0.7 ECA: 98 cm/sec LEFT ICA: 89/23 cm/sec CCA: 56/9 cm/sec SYSTOLIC ICA/CCA RATIO:  1.6 ECA: 94 cm/sec RIGHT CAROTID ARTERY:  There is a minimal amount of eccentric echogenic plaque within the right carotid bulb (images 14 and 15), progressed compared to the 12/2010 examination though not resulting in elevated peak systolic velocities within the interrogated course the right internal carotid artery to suggest a hemodynamically significant stenosis. RIGHT VERTEBRAL ARTERY:  Antegrade Flow LEFT CAROTID ARTERY: There is a moderate amount of eccentric echogenic plaque within the left carotid bulb (images 48), extending through the interrogated course of the left internal carotid artery (image 55), progressed compared to the 12/2010 examination, though not resulting in elevated peak systolic velocities within the interrogated course the left internal carotid artery to suggest a hemodynamically significant stenosis. LEFT VERTEBRAL ARTERY:  Antegrade Flow IMPRESSION: Minimal to moderate amount of bilateral atherosclerotic plaque, left greater than right, progressed compared to the 12/2010 examination though not resulting in a hemodynamically significant stenosis within either internal carotid artery. Electronically Signed   By: Simonne Come M.D.   On: 08/23/2018 08:34     Assessment/Plan: 78 years admitted after being found on floor. No Cardiac arrest/hypoxia noted in note. Patient being treated for rhabdo, hypernatremia, GI bleed, decubitus ulcer and infected leg. MRI with a small focus of  restriction on the left frontal lobe that could be due to hypoperfusion/stroke. Echo with EF 30- 40 %.  - Neuro protectives measures including normothermia, normoglycemia, correct electrolytes/metabolic abnilites - would  not start AEDS for now - start ASA if feasible - stroke w/up -Discussed the case with primary team at bedside and remaining of treatment by primary team  Tomi Bamberger, MD Neurology  08/23/2018, 9:33 AM

## 2018-08-23 NOTE — Consult Note (Signed)
Spencer Edwards , MD [address], [address], [place], [place], [id number] [address], [address], [place], [place], [address] Phone: [phone]  Fax: [phone]  Consultation  Referring Provider:     Dr Tobi BastosPyreddy  Primary Care Physician:  Patient, No Pcp Per Primary Gastroenterologist:: None       Reason for Consultation:     GI bleed   Date of Admission:  08/20/2018 Date of Consultation:  08/23/2018         HPI:   Spencer BrunnerRichard T Edwards is a 78 y.o. male who has a history of diabetes mellitus type 2, coronary artery disease, NSTEMI, hypertension, rhabdomyolysis and being treated for sepsis secondary to left leg infection.  On antibiotics.  Has an elevated troponin.  Hemoglobin is 12.7 g today.  Was 13.2 g yesterday.  I checked with Dr. Tobi BastosPyreddy and there was some mention at some point somewhere that melena was noted.  But none has been seen after the patient has been admitted.  Patient denies any melena or hematemesis.   Past Medical History:  Diagnosis Date   Acute kidney failure (HCC)    Anxiety disorder    Coronary artery disease    Non-ST elevation myocardial infarction in November 2015 in the setting of septic shock and multiorgan failure. Peak troponin was 25. Echo showed normal LV systolic function with inferior and posterior hypokinesis.   Diabetes mellitus without complication (HCC)    Dysphagia    Gastro-esophageal reflux    Hematuria    Hyperlipidemia    Hypernatremia    Hypertension    MI (myocardial infarction) (HCC)    Muscle weakness    PVD (peripheral vascular disease) (HCC)    Rhabdomyolysis    Urinary retention     Past Surgical History:  Procedure Laterality Date   ABDOMINAL SURGERY     leg amputation      Prior to Admission medications   Medication Sig Start Date End Date Taking? Authorizing Provider  ALPRAZolam (XANAX) 0.25 MG tablet Take 0.25 mg by mouth 3 (three) times daily.   Yes [provider]  Amino Acids-Protein  Hydrolys (FEEDING SUPPLEMENT, PRO-STAT SUGAR FREE 64,) LIQD Take 30 mLs by mouth 2 (two) times daily.   Yes [provider]  aspirin EC 81 MG tablet Take 81 mg by mouth daily.    Yes [provider]  atorvastatin (LIPITOR) 80 MG tablet Take 80 mg by mouth daily.   Yes [provider]  bisacodyl (DULCOLAX) 10 MG suppository Place 10 mg rectally as needed for moderate constipation.   Yes [provider]  clopidogrel (PLAVIX) 75 MG tablet Take 75 mg by mouth daily.   Yes [provider]  famotidine (PEPCID) 20 MG tablet Take 20 mg by mouth daily.   Yes [provider]  insulin glargine (LANTUS) 100 UNIT/ML injection Inject 24 Units into the skin at bedtime.   Yes [provider]  ipratropium-albuterol (DUONEB) 0.5-2.5 (3) MG/3ML SOLN Take 3 mLs by nebulization every 4 (four) hours as needed.   Yes [provider]  metoprolol tartrate (LOPRESSOR) 25 MG tablet Take 12.5 mg by mouth 2 (two) times daily.   Yes [provider]  promethazine (PHENERGAN) 25 MG tablet Take 25 mg by mouth every 8 (eight) hours as needed for nausea or vomiting.   Yes [provider]  sennosides (SENOKOT) 8.8 MG/5ML syrup Take 5 mLs by mouth every 12 (twelve) hours.   Yes [provider]  sulfamethoxazole-trimethoprim (BACTRIM DS,SEPTRA  DS) 800-160 MG per tablet Take 1 tablet by mouth daily.   Yes [provider]  tamsulosin (FLOMAX) 0.4 MG CAPS capsule Take 0.4 mg by mouth daily.   Yes [provider]    Family History  Problem Relation Age of Onset   Heart Problems Father    Heart disease Father      Social History   Tobacco Use   Smoking status: Former Smoker    Types: Cigarettes   Smokeless tobacco: Never Used  Substance Use Topics   Alcohol use: No   Drug use: No    Allergies as of 08/20/2018   (No Known Allergies)    Review of Systems:    All systems reviewed and negative except  where noted in HPI.   Physical Exam:  Vital signs in last 24 hours: Temp:  [97.6 F (36.4 C)-99.3 F (37.4 C)] 99.3 F (37.4 C) (07/12 0835) Pulse Rate:  [76-84] 81 (07/12 0835) Resp:  [18-20] 20 (07/12 0835) BP: (92-109)/(49-63) 105/57 (07/12 0835) SpO2:  [95 %-98 %] 95 % (07/12 0835) Last BM Date: 08/19/18 General:   Pleasant, cooperative in NAD Head:  Normocephalic and atraumatic. Eyes:   No icterus.   Conjunctiva pink. PERRLA. Ears:  Normal auditory acuity. Neck:  Supple; no masses or thyroidomegaly Lungs: Respirations even and unlabored. Lungs clear to auscultation bilaterally.   No wheezes, crackles, or rhonchi.  Heart:  Regular rate and rhythm;  Without murmur, clicks, rubs or gallops Abdomen:  Soft, nondistended, nontender. Normal bowel sounds. No appreciable masses or hepatomegaly.  No rebound or guarding.  Neurologic:  Alert and oriented x3;  grossly normal neurologically. Skin:  Intact without significant lesions or rashes. Cervical Nodes:  No significant cervical adenopathy. Psych:  Alert and cooperative. Normal affect.  LAB RESULTS: Recent Labs    08/21/18 0535 08/22/18 0531 08/23/18 0524  WBC 12.4* 11.5* 9.2  HGB 15.2 13.2 12.7*  HCT 49.5 42.6 40.1  PLT 285 209 185   BMET Recent Labs    08/21/18 0535 08/22/18 0531 08/23/18 0524  NA 149* 150* 146*  K 4.1 3.8 3.6  CL 118* 120* 118*  CO2 22 21* 24  GLUCOSE 343* 162* 257*  BUN 56* 38* 23  CREATININE 1.16 0.96 0.86  CALCIUM 8.8* 8.3* 7.9*   LFT Recent Labs    08/20/18 1314  PROT 7.6  ALBUMIN 3.2*  AST 52*  ALT 52*  ALKPHOS 88  BILITOT 1.7*   PT/INR No results for input(s): LABPROT, INR in the last 72 hours.  STUDIES: Dg Abd 1 View  Result Date: 08/22/2018 CLINICAL DATA:  Metal screening prior to MRI. EXAM: ABDOMEN - 1 VIEW COMPARISON:  January 01, 2014 FINDINGS: Numerous surgical clips are seen throughout the abdomen. An anastomotic ring is seen in the right lower quadrant. No other metal  identified in the abdomen or pelvis. No other acute abnormalities. IMPRESSION: Surgical clips and an anastomotic ring in the abdomen. No other metal seen in the abdomen or pelvis. Electronically Signed   By: Dorise Bullion III M.D   On: 08/22/2018 13:28   Mr Brain Wo Contrast  Result Date: 08/22/2018 CLINICAL DATA:  Seizure, new.  Fall from bed 4 days ago. EXAM: MRI HEAD WITHOUT CONTRAST TECHNIQUE: Multiplanar, multiecho pulse sequences of the brain and surrounding structures were obtained without intravenous contrast. COMPARISON:  CT head without contrast 08/20/2018 FINDINGS: Brain: A 4 mm acute cortical infarct is present in posterior left frontal lobe. T2 signal changes are associated  with the area of acute infarction. No other acute infarct is present. Mild generalized atrophy is present. There is relatively minimal white matter disease. The ventricles are proportionate to the degree of atrophy. No significant extraaxial fluid collection is present. The internal auditory canals are within normal limits. The brainstem and cerebellum are within normal limits. Vascular: Flow is present in the major intracranial arteries. Skull and upper cervical spine: The craniocervical junction is normal. Upper cervical spine is within normal limits. Marrow signal is unremarkable. Sinuses/Orbits: The paranasal sinuses are clear. There is some fluid in the right mastoid air cells. No obstructing nasopharyngeal lesion is present. The globes and orbits are within normal limits. IMPRESSION: 1. 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 2. Otherwise normal MRI appearance of the brain for age. 3. Minimal right mastoid effusion. No obstructing nasopharyngeal lesion is present. This was not present on the prior CT. Electronically Signed   By: Marin Robertshristopher  Mattern M.D.   On: 08/22/2018 15:04   [place] Carotid Bilateral  Result Date: 08/23/2018 CLINICAL DATA:  CVA. History of hypertension, CAD, hyperlipidemia,  diabetes and smoking. EXAM: BILATERAL CAROTID DUPLEX ULTRASOUND TECHNIQUE: Wallace CullensGray scale imaging, color Doppler and duplex ultrasound were performed of bilateral carotid and vertebral arteries in the neck. COMPARISON:  01/10/2011 FINDINGS: Criteria: Quantification of carotid stenosis is based on velocity parameters that correlate the residual internal carotid diameter with NASCET-based stenosis levels, using the diameter of the distal internal carotid lumen as the denominator for stenosis measurement. The following velocity measurements were obtained: RIGHT ICA: 48/11 cm/sec CCA: 64/9 cm/sec SYSTOLIC ICA/CCA RATIO:  0.7 ECA: 98 cm/sec LEFT ICA: 89/23 cm/sec CCA: 56/9 cm/sec SYSTOLIC ICA/CCA RATIO:  1.6 ECA: 94 cm/sec RIGHT CAROTID ARTERY: There is a minimal amount of eccentric echogenic plaque within the right carotid bulb (images 14 and 15), progressed compared to the 12/2010 examination though not resulting in elevated peak systolic velocities within the interrogated course the right internal carotid artery to suggest a hemodynamically significant stenosis. RIGHT VERTEBRAL ARTERY:  Antegrade Flow LEFT CAROTID ARTERY: There is a moderate amount of eccentric echogenic plaque within the left carotid bulb (images 48), extending through the interrogated course of the left internal carotid artery (image 55), progressed compared to the 12/2010 examination, though not resulting in elevated peak systolic velocities within the interrogated course the left internal carotid artery to suggest a hemodynamically significant stenosis. LEFT VERTEBRAL ARTERY:  Antegrade Flow IMPRESSION: Minimal to moderate amount of bilateral atherosclerotic plaque, left greater than right, progressed compared to the 12/2010 examination though not resulting in a hemodynamically significant stenosis within either internal carotid artery. Electronically Signed   By: Simonne ComeJohn  Watts M.D.   On: 08/23/2018 08:34      Impression / Plan:   Spencer BrunnerRichard T  Edwards is a 78 y.o. y/o male with multiple comorbidities admitted with sepsis rhabdomyolysis possible non-ST elevated myocardial infarction.  I have been consulted for a GI bleed.  Hemoglobin is 12.7 g which is in the normal range.  Apparently someone had noticed some melena at some point during the admission.  None has been seen later.  In view of his multiple comorbidities we should not be performing any endoscopic procedures unless we definitely know that he is having an active GI bleed.  I would suggest to continue monitoring him and look for hematemesis or melena or hematochezia with associated drop in hemoglobin.  Do not perform any stool occult testing as it is not a test for a GI  bleed and is only a test for colon cancer screening which he also would not require at his age.  If there is any significant drop in hemoglobin or active GI bleeding noted please call me right away  I will sign off.  Please call me if any further GI concerns or questions.  We would like to thank you for the opportunity to participate in the care of Spencer Edwards.   Thank you for involving me in the care of this patient.      LOS: 3 days   Spencer MoodKiran Vasilia Dise, MD  08/23/2018, 10:05 AM

## 2018-08-23 NOTE — Progress Notes (Signed)
ID Morganella in the blood Multiple wounds Patient Vitals for the past 24 hrs:  BP Temp Temp src Pulse Resp SpO2  08/23/18 0835 (!) 105/57 99.3 F (37.4 C) Oral 81 20 95 %  08/23/18 0441 107/61 97.6 F (36.4 C) Oral 76 18 95 %  08/23/18 0013 (!) 105/59 98.3 F (36.8 C) Oral 76 18 96 %  08/22/18 2258 103/60 - - 79 - -  08/22/18 2024 109/63 98.7 F (37.1 C) Oral 79 - 97 %  08/22/18 1554 (!) 92/49 98.9 F (37.2 C) Oral 81 - 95 %  08/22/18 1201 (!) 100/55 98.6 F (37 C) Oral 77 - 98 %  08/22/18 1129 (!) 106/58 97.9 F (36.6 C) Oral 84 - 97 %   CBC Latest Ref Rng & Units 08/23/2018 08/22/2018 08/21/2018  WBC 4.0 - 10.5 K/uL 9.2 11.5(H) 12.4(H)  Hemoglobin 13.0 - 17.0 g/dL 12.7(L) 13.2 15.2  Hematocrit 39.0 - 52.0 % 40.1 42.6 49.5  Platelets 150 - 400 K/uL 185 209 285    CMP Latest Ref Rng & Units 08/23/2018 08/22/2018 08/21/2018  Glucose 70 - 99 mg/dL 257(H) 162(H) 343(H)  BUN 8 - 23 mg/dL 23 38(H) 56(H)  Creatinine 0.61 - 1.24 mg/dL 0.86 0.96 1.16  Sodium 135 - 145 mmol/L 146(H) 150(H) 149(H)  Potassium 3.5 - 5.1 mmol/L 3.6 3.8 4.1  Chloride 98 - 111 mmol/L 118(H) 120(H) 118(H)  CO2 22 - 32 mmol/L 24 21(L) 22  Calcium 8.9 - 10.3 mg/dL 7.9(L) 8.3(L) 8.8(L)  Total Protein 6.5 - 8.1 g/dL - - -  Total Bilirubin 0.3 - 1.2 mg/dL - - -  Alkaline Phos 38 - 126 U/L - - -  AST 15 - 41 U/L - - -  ALT 0 - 44 U/L - - -   Will continue current antibiotics vanco, ceftriaxone and flagyl Await culture of the toe wound

## 2018-08-23 NOTE — Progress Notes (Addendum)
[employer] -  at [facility]   PATIENT NAME: Spencer Edwards    MR#:  [id number]  DATE OF BIRTH:  03/28/1940  SUBJECTIVE:  CHIEF COMPLAINT:   Chief Complaint  Patient presents with   Fall  Patient seen today On oxygen via nasal cannula Not a great historian No complaints of chest pain No complains of any shortness of breath  Current diet level 1 dysphagia diet with nectar thick liquids REVIEW OF SYSTEMS:    ROS  CONSTITUTIONAL: No documented fever. Has fatigue, weakness. No weight gain, no weight loss.  EYES: No blurry or double vision.  ENT: No tinnitus. No postnasal drip. No redness of the oropharynx.  RESPIRATORY: No cough, no wheeze, no hemoptysis. No dyspnea.  CARDIOVASCULAR: No chest pain. No orthopnea. No palpitations. No syncope.  GASTROINTESTINAL: No nausea, no vomiting or diarrhea. No abdominal pain. Has melena GENITOURINARY: No dysuria or hematuria.  ENDOCRINE: No polyuria or nocturia. No heat or cold intolerance.  HEMATOLOGY: No anemia. No bruising. No bleeding.  INTEGUMENTARY: Has decubitus ulcer Has non healing ulcer on the leg.  MUSCULOSKELETAL: One leg amputated NEUROLOGIC: No numbness, tingling, or ataxia. No seizure-type activity.  PSYCHIATRIC: No anxiety. No insomnia. No ADD.   DRUG ALLERGIES:  No Known Allergies  VITALS:  Blood pressure (!) 105/57, pulse 81, temperature 99.3 F (37.4 C), temperature source Oral, resp. rate 20, height 5\' 10"  (1.778 m), weight 83 kg, SpO2 95 %.  PHYSICAL EXAMINATION:   Physical Exam  GENERAL:  78 y.o.-year-old patient lying in the bed with no acute distress.  EYES: Pupils equal, round, reactive to light and accommodation. No scleral icterus. Extraocular muscles intact.  HEENT: Head atraumatic, normocephalic. Oropharynx and nasopharynx clear.  NECK:  Supple, no jugular venous distention. No thyroid enlargement, no tenderness.  LUNGS: Normal breath sounds bilaterally, no wheezing, rales,  rhonchi. No use of accessory muscles of respiration.  CARDIOVASCULAR: S1, S2 normal. No murmurs, rubs, or gallops.  ABDOMEN: Soft, nontender, nondistended. Bowel sounds present. No organomegaly or mass.  EXTREMITIES: No cyanosis, clubbing  Prior amputation right leg NEUROLOGIC: Cranial nerves II through XII are intact. No focal Motor or sensory deficits b/l.   PSYCHIATRIC: The patient is alert and oriented x 3.  SKIN: Decubitus ulcer Ulcer left leg, left foot               LABORATORY PANEL:   CBC Recent Labs  Lab 08/23/18 0524  WBC 9.2  HGB 12.7*  HCT 40.1  PLT 185   ------------------------------------------------------------------------------------------------------------------ Chemistries  Recent Labs  Lab 08/20/18 1314  08/23/18 0524  NA 143   < > 146*  K 4.7   < > 3.6  CL 103   < > 118*  CO2 24   < > 24  GLUCOSE 492*   < > 257*  BUN 75*   < > 23  CREATININE 1.50*   < > 0.86  CALCIUM 9.9   < > 7.9*  AST 52*  --   --   ALT 52*  --   --   ALKPHOS 88  --   --   BILITOT 1.7*  --   --    < > = values in this interval not displayed.   ------------------------------------------------------------------------------------------------------------------  Cardiac Enzymes No results for input(s): TROPONINI in the last 168 hours. ------------------------------------------------------------------------------------------------------------------  RADIOLOGY:  Dg Abd 1 View  Result Date: 08/22/2018 CLINICAL DATA:  Metal screening prior to MRI. EXAM: ABDOMEN - 1 VIEW COMPARISON:  January 01, 2014 FINDINGS: Numerous surgical clips are seen throughout the abdomen. An anastomotic ring is seen in the right lower quadrant. No other metal identified in the abdomen or pelvis. No other acute abnormalities. IMPRESSION: Surgical clips and an anastomotic ring in the abdomen. No other metal seen in the abdomen or pelvis. Electronically Signed   By: Dorise Bullion III M.D   On:  08/22/2018 13:28   Mr Brain Wo Contrast  Result Date: 08/22/2018 CLINICAL DATA:  Seizure, new.  Fall from bed 4 days ago. EXAM: MRI HEAD WITHOUT CONTRAST TECHNIQUE: Multiplanar, multiecho pulse sequences of the brain and surrounding structures were obtained without intravenous contrast. COMPARISON:  CT head without contrast 08/20/2018 FINDINGS: Brain: A 4 mm acute cortical infarct is present in posterior left frontal lobe. T2 signal changes are associated with the area of acute infarction. No other acute infarct is present. Mild generalized atrophy is present. There is relatively minimal white matter disease. The ventricles are proportionate to the degree of atrophy. No significant extraaxial fluid collection is present. The internal auditory canals are within normal limits. The brainstem and cerebellum are within normal limits. Vascular: Flow is present in the major intracranial arteries. Skull and upper cervical spine: The craniocervical junction is normal. Upper cervical spine is within normal limits. Marrow signal is unremarkable. Sinuses/Orbits: The paranasal sinuses are clear. There is some fluid in the right mastoid air cells. No obstructing nasopharyngeal lesion is present. The globes and orbits are within normal limits. IMPRESSION: 1. 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 2. Otherwise normal MRI appearance of the brain for age. 3. Minimal right mastoid effusion. No obstructing nasopharyngeal lesion is present. This was not present on the prior CT. Electronically Signed   By: San Morelle M.D.   On: 08/22/2018 15:04   [place] Carotid Bilateral  Result Date: 08/23/2018 CLINICAL DATA:  CVA. History of hypertension, CAD, hyperlipidemia, diabetes and smoking. EXAM: BILATERAL CAROTID DUPLEX ULTRASOUND TECHNIQUE: Pearline Cables scale imaging, color Doppler and duplex ultrasound were performed of bilateral carotid and vertebral arteries in the neck. COMPARISON:  01/10/2011 FINDINGS:  Criteria: Quantification of carotid stenosis is based on velocity parameters that correlate the residual internal carotid diameter with NASCET-based stenosis levels, using the diameter of the distal internal carotid lumen as the denominator for stenosis measurement. The following velocity measurements were obtained: RIGHT ICA: 48/11 cm/sec CCA: 92/3 cm/sec SYSTOLIC ICA/CCA RATIO:  0.7 ECA: 98 cm/sec LEFT ICA: 89/23 cm/sec CCA: 30/0 cm/sec SYSTOLIC ICA/CCA RATIO:  1.6 ECA: 94 cm/sec RIGHT CAROTID ARTERY: There is a minimal amount of eccentric echogenic plaque within the right carotid bulb (images 14 and 15), progressed compared to the 12/2010 examination though not resulting in elevated peak systolic velocities within the interrogated course the right internal carotid artery to suggest a hemodynamically significant stenosis. RIGHT VERTEBRAL ARTERY:  Antegrade Flow LEFT CAROTID ARTERY: There is a moderate amount of eccentric echogenic plaque within the left carotid bulb (images 48), extending through the interrogated course of the left internal carotid artery (image 55), progressed compared to the 12/2010 examination, though not resulting in elevated peak systolic velocities within the interrogated course the left internal carotid artery to suggest a hemodynamically significant stenosis. LEFT VERTEBRAL ARTERY:  Antegrade Flow IMPRESSION: Minimal to moderate amount of bilateral atherosclerotic plaque, left greater than right, progressed compared to the 12/2010 examination though not resulting in a hemodynamically significant stenosis within either internal carotid artery. Electronically Signed   By: Sandi Mariscal M.D.   On:  08/23/2018 08:34     ASSESSMENT AND PLAN:  78 year old male patient with history of diabetes mellitus type 2, coronary disease, non-STEMI,, diabetes mellitus type 2, GERD, hyperlipidemia, hypertension, peripheral vascular disease, rhabdomyolysis currently under hospitalist service  -Sepsis  secondary to left leg infection May need amputation Vascular surgery consult appreciated Continue IV vancomycin and Zosyn antibiotics And IV Flagyl antibiotic Infectious disease consult Aerobic cultures sent from the left foot  -MRSA and Morganella bacteremia Continue IV vancomycin antibiotic and Zosyn intravenously Further antibiotic recommendation as per infectious disease specialist  -Acute frontal CVA Oral aspirin Status post neurology evaluation Currently on neurochecks Carotid ultrasound showed bilateral minimal atherosclerotic disease Echocardiogram reviewed Supportive care Physical therapy and speech therapy as feasible  -Elevated troponin Abnormal rhythms on telemetry monitor Could be from demand ischemia from sepsis In view of multiple medical problems and poor prognosis aggressive intervention has been deferred by cardiology Continue beta-blocker Statin cannot be given secondary to rhabdomyolysis Cardiology consult appreciated Follow-up with cardiology who did not recommend any acute intervention 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% Cardiac monitoring to continue  -Acute rhabdomyolysis improved CK levels improved Continue to hold statin medication  -Acute hypernatremia improving IV fluids switched to D5 normal saline Monitor electrolytes  -GI bleed Hemoglobin hematocrit appears to be stable Questionable etiology Drop in hemoglobin could be secondary to hemodilution Continue IV proton pump inhibitor Gastroenterology evaluation for supportive care Interventions might not be possible secondary to multiple medical problems and patient might not be able to withstand anesthesia Serial hemoglobin hematocrit monitoring  -Acute renal insufficiency Improving Monitor renal function with IV fluids Avoid nephrotoxic medication  -Decubitus ulcer infected Wound care consult Surgery consult would  debridement Broad-spectrum antibiotics  -Palliative care consult To address goals of care  -Long-term prognosis poor  -DNR by CODE STATUS  All the records are reviewed and case discussed with Care Management/Social Worker. Management plans discussed with the patient, family and they are in agreement.  CODE STATUS: DNR  DVT Prophylaxis: SCDs  TOTAL TIME TAKING CARE OF THIS PATIENT: 36 minutes.   POSSIBLE D/C IN 3 to 4 DAYS, DEPENDING ON CLINICAL CONDITION.  Ihor AustinPavan Roselle Norton M.D on 08/23/2018 at 10:52 AM  Between 7am to 6pm - Pager - [phone]  After 6pm go to [email or web address] - password [email or web address]  [facility] [place] [facility]  Office  [phone]  CC: Primary care physician; Patient, No Pcp Per  Note: This dictation was prepared with Dragon dictation along with smaller phrase technology. Any transcriptional errors that result from this process are unintentional.

## 2018-08-24 ENCOUNTER — Encounter
Admission: EM | Disposition: A | Discharge status: Skilled Nursing Facility | Payer: Self-pay | Source: Home / Self Care | Attending: Internal Medicine

## 2018-08-24 DIAGNOSIS — B957 Other staphylococcus as the cause of diseases classified elsewhere: Secondary | ICD-10-CM

## 2018-08-24 DIAGNOSIS — D72829 Elevated white blood cell count, unspecified: Secondary | ICD-10-CM

## 2018-08-24 DIAGNOSIS — L97229 Non-pressure chronic ulcer of left calf with unspecified severity: Secondary | ICD-10-CM

## 2018-08-24 DIAGNOSIS — L97529 Non-pressure chronic ulcer of other part of left foot with unspecified severity: Secondary | ICD-10-CM

## 2018-08-24 DIAGNOSIS — B9689 Other specified bacterial agents as the cause of diseases classified elsewhere: Secondary | ICD-10-CM

## 2018-08-24 DIAGNOSIS — I639 Cerebral infarction, unspecified: Secondary | ICD-10-CM

## 2018-08-24 LAB — GLUCOSE, CAPILLARY
Glucose-Capillary: 461 mg/dL — ABNORMAL HIGH (ref 70–99)
Glucose-Capillary: 369 mg/dL — ABNORMAL HIGH (ref 70–99)
Glucose-Capillary: 246 mg/dL — ABNORMAL HIGH (ref 70–99)
Glucose-Capillary: 212 mg/dL — ABNORMAL HIGH (ref 70–99)

## 2018-08-24 LAB — BASIC METABOLIC PANEL
Anion gap: 4 — ABNORMAL LOW (ref 5–15)
BUN: 23 mg/dL (ref 8–23)
CO2: 21 mmol/L — ABNORMAL LOW (ref 22–32)
Calcium: 7.7 mg/dL — ABNORMAL LOW (ref 8.9–10.3)
Chloride: 118 mmol/L — ABNORMAL HIGH (ref 98–111)
Creatinine, Ser: 1.18 mg/dL (ref 0.61–1.24)
GFR calc Af Amer: >60 mL/min (ref >60)
GFR calc non Af Amer: 58 mL/min — ABNORMAL LOW (ref >60)
Glucose, Bld: 412 mg/dL — ABNORMAL HIGH (ref 70–99)
Potassium: 3.7 mmol/L (ref 3.5–5.1)
Sodium: 143 mmol/L (ref 135–145)

## 2018-08-24 LAB — CBC
HCT: 41 % (ref 39.0–52.0)
Hemoglobin: 12.8 g/dL — ABNORMAL LOW (ref 13.0–17.0)
MCH: 26.8 pg (ref 26.0–34.0)
MCHC: 31.2 g/dL (ref 30.0–36.0)
MCV: 86 fL (ref 80.0–100.0)
Platelets: 200 10*3/uL (ref 150–400)
RBC: 4.77 MIL/uL (ref 4.22–5.81)
RDW: 13.7 % (ref 11.5–15.5)
WBC: 16.5 10*3/uL — ABNORMAL HIGH (ref 4.0–10.5)
nRBC: 0 % (ref 0.0–0.2)

## 2018-08-24 LAB — AEROBIC CULTURE W GRAM STAIN (SUPERFICIAL SPECIMEN)

## 2018-08-24 LAB — MAGNESIUM: Magnesium: 2.3 mg/dL (ref 1.7–2.4)

## 2018-08-24 LAB — VANCOMYCIN, TROUGH: Vancomycin Tr: 16 ug/mL (ref 15–20)

## 2018-08-24 LAB — HEMOGLOBIN A1C
Hgb A1c MFr Bld: 12.4 % — ABNORMAL HIGH (ref 4.8–5.6)
Mean Plasma Glucose: 309 mg/dL

## 2018-08-24 SURGERY — LOWER EXTREMITY ANGIOGRAPHY
Anesthesia: Moderate Sedation | Laterality: Left

## 2018-08-24 MED ORDER — INSULIN ASPART 100 UNIT/ML ~~LOC~~ SOLN
12.0000 [IU] | Freq: Once | SUBCUTANEOUS | Status: AC
  Administered 2018-08-24: 12 [IU] via SUBCUTANEOUS
  Filled 2018-08-24: qty 1

## 2018-08-24 MED ORDER — PIPERACILLIN-TAZOBACTAM 3.375 G IVPB 30 MIN
3.3750 g | Freq: Three times a day (TID) | INTRAVENOUS | Status: DC
  Filled 2018-08-24 (×2): qty 50

## 2018-08-24 MED ORDER — INSULIN GLARGINE 100 UNIT/ML ~~LOC~~ SOLN
12.0000 [IU] | Freq: Every day | SUBCUTANEOUS | Status: DC
  Administered 2018-08-24 – 2018-08-25 (×2): 12 [IU] via SUBCUTANEOUS
  Filled 2018-08-24 (×2): qty 0.12

## 2018-08-24 MED ORDER — SODIUM CHLORIDE 0.9 % IV SOLN
INTRAVENOUS | Status: DC
  Administered 2018-08-24 – 2018-08-25 (×2): via INTRAVENOUS

## 2018-08-24 MED ORDER — PIPERACILLIN-TAZOBACTAM 3.375 G IVPB
3.3750 g | Freq: Three times a day (TID) | INTRAVENOUS | Status: DC
  Administered 2018-08-24 – 2018-08-31 (×21): 3.375 g via INTRAVENOUS
  Filled 2018-08-24 (×22): qty 50

## 2018-08-24 NOTE — TOC Progression Note (Signed)
Transition of Care [facility]) - Progression Note    Patient Details  Name: HENDRY SPEAS MRN: [id number] Date of Birth: [birth year]  Transition of Care [facility]) CM/SW Contact  Hayat Warbington, Lenice Llamas Phone Number: [phone]  08/24/2018, 3:50 PM  Clinical Narrative: Clinical Social Worker ([place]) received a call from patient's APS worker Nkechi. Per APS worker she was able to complete an assessment with patient on Friday 7/10 with the bedside nurse's assistance. Per APS worker patient was not oriented during that assessment. CSW made APS worker aware that patient has no decision maker at this point. CSW explained that patient's friends Herbie Baltimore and Salli Real were willing to make decisions if needed. Per APS worker she is going to work on getting a protective order so DSS can make decisions for patient. CSW will continue to follow and assist as needed.      Expected Discharge Plan: [place] Barriers to Discharge: Continued Medical Work up  Expected Discharge Plan and Services Expected Discharge Plan: [place]   Discharge Planning Services: CM Consult   Living arrangements for the past 2 months: Single Family Home                                       Social Determinants of Health (SDOH) Interventions    Readmission Risk Interventions No flowsheet data found.

## 2018-08-24 NOTE — Progress Notes (Addendum)
[employer] - [place] at [facility]   PATIENT NAME: Spencer Edwards    MR#:  [id number]  DATE OF BIRTH:  01/16/1941  SUBJECTIVE:  CHIEF COMPLAINT:   Chief Complaint  Patient presents with  . Fall  Patient seen today On oxygen via nasal cannula Not a great historian More lethargic Unable to give history   Current diet level 1 dysphagia diet with nectar thick liquids REVIEW OF SYSTEMS:    ROS  Could not be obtained secondary to confusion  DRUG ALLERGIES:  No Known Allergies  VITALS:  Blood pressure 114/61, pulse 93, temperature 97.8 F (36.6 C), temperature source Oral, resp. rate (!) 24, height 5\' 10"  (1.778 m), weight 83 kg, SpO2 93 %.  PHYSICAL EXAMINATION:   Physical Exam  GENERAL:  78 y.o.-year-old patient lying in the bed with no acute distress.  EYES: Pupils equal, round, reactive to light and accommodation. No scleral icterus. Extraocular muscles intact.  HEENT: Head atraumatic, normocephalic. Oropharynx and nasopharynx clear.  NECK:  Supple, no jugular venous distention. No thyroid enlargement, no tenderness.  LUNGS: Normal breath sounds bilaterally, no wheezing, rales, rhonchi. No use of accessory muscles of respiration.  CARDIOVASCULAR: S1, S2 normal. No murmurs, rubs, or gallops.  ABDOMEN: Soft, nontender, nondistended. Bowel sounds present. No organomegaly or mass.  EXTREMITIES: No cyanosis, clubbing  Prior amputation right leg NEUROLOGIC: Arousable to loud verbal commands and painful stimuli Moves extremities Complete nervous system exam was not possible PSYCHIATRIC: The patient is alert and oriented x none.  SKIN: Decubitus ulcer Ulcer left leg, left foot               LABORATORY PANEL:   CBC Recent Labs  Lab 08/24/18 0432  WBC 16.5*  HGB 12.8*  HCT 41.0  PLT 200   ------------------------------------------------------------------------------------------------------------------ Chemistries  Recent Labs  Lab  08/20/18 1314  08/24/18 0432  NA 143   < > 143  K 4.7   < > 3.7  CL 103   < > 118*  CO2 24   < > 21*  GLUCOSE 492*   < > 412*  BUN 75*   < > 23  CREATININE 1.50*   < > 1.18  CALCIUM 9.9   < > 7.7*  MG  --   --  2.3  AST 52*  --   --   ALT 52*  --   --   ALKPHOS 88  --   --   BILITOT 1.7*  --   --    < > = values in this interval not displayed.   ------------------------------------------------------------------------------------------------------------------  Cardiac Enzymes No results for input(s): TROPONINI in the last 168 hours. ------------------------------------------------------------------------------------------------------------------  RADIOLOGY:  Dg Abd 1 View  Result Date: 08/22/2018 CLINICAL DATA:  Metal screening prior to MRI. EXAM: ABDOMEN - 1 VIEW COMPARISON:  January 01, 2014 FINDINGS: Numerous surgical clips are seen throughout the abdomen. An anastomotic ring is seen in the right lower quadrant. No other metal identified in the abdomen or pelvis. No other acute abnormalities. IMPRESSION: Surgical clips and an anastomotic ring in the abdomen. No other metal seen in the abdomen or pelvis. Electronically Signed   By: Gerome Samavid  Williams III M.D   On: 08/22/2018 13:28   Mr Brain Wo Contrast  Result Date: 08/22/2018 CLINICAL DATA:  Seizure, new.  Fall from bed 4 days ago. EXAM: MRI HEAD WITHOUT CONTRAST TECHNIQUE: Multiplanar, multiecho pulse sequences of the brain and surrounding structures were obtained without intravenous contrast. COMPARISON:  CT  head without contrast 08/20/2018 FINDINGS: Brain: A 4 mm acute cortical infarct is present in posterior left frontal lobe. T2 signal changes are associated with the area of acute infarction. No other acute infarct is present. Mild generalized atrophy is present. There is relatively minimal white matter disease. The ventricles are proportionate to the degree of atrophy. No significant extraaxial fluid collection is present. The  internal auditory canals are within normal limits. The brainstem and cerebellum are within normal limits. Vascular: Flow is present in the major intracranial arteries. Skull and upper cervical spine: The craniocervical junction is normal. Upper cervical spine is within normal limits. Marrow signal is unremarkable. Sinuses/Orbits: The paranasal sinuses are clear. There is some fluid in the right mastoid air cells. No obstructing nasopharyngeal lesion is present. The globes and orbits are within normal limits. IMPRESSION: 1. 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 2. Otherwise normal MRI appearance of the brain for age. 3. Minimal right mastoid effusion. No obstructing nasopharyngeal lesion is present. This was not present on the prior CT. Electronically Signed   By: San Morelle M.D.   On: 08/22/2018 15:04   US Carotid Bilateral  Result Date: 08/23/2018 CLINICAL DATA:  CVA. History of hypertension, CAD, hyperlipidemia, diabetes and smoking. EXAM: BILATERAL CAROTID DUPLEX ULTRASOUND TECHNIQUE: Pearline Cables scale imaging, color Doppler and duplex ultrasound were performed of bilateral carotid and vertebral arteries in the neck. COMPARISON:  01/10/2011 FINDINGS: Criteria: Quantification of carotid stenosis is based on velocity parameters that correlate the residual internal carotid diameter with NASCET-based stenosis levels, using the diameter of the distal internal carotid lumen as the denominator for stenosis measurement. The following velocity measurements were obtained: RIGHT ICA: 48/11 cm/sec CCA: 98/1 cm/sec SYSTOLIC ICA/CCA RATIO:  0.7 ECA: 98 cm/sec LEFT ICA: 89/23 cm/sec CCA: 19/1 cm/sec SYSTOLIC ICA/CCA RATIO:  1.6 ECA: 94 cm/sec RIGHT CAROTID ARTERY: There is a minimal amount of eccentric echogenic plaque within the right carotid bulb (images 14 and 15), progressed compared to the 12/2010 examination though not resulting in elevated peak systolic velocities within the  interrogated course the right internal carotid artery to suggest a hemodynamically significant stenosis. RIGHT VERTEBRAL ARTERY:  Antegrade Flow LEFT CAROTID ARTERY: There is a moderate amount of eccentric echogenic plaque within the left carotid bulb (images 48), extending through the interrogated course of the left internal carotid artery (image 55), progressed compared to the 12/2010 examination, though not resulting in elevated peak systolic velocities within the interrogated course the left internal carotid artery to suggest a hemodynamically significant stenosis. LEFT VERTEBRAL ARTERY:  Antegrade Flow IMPRESSION: Minimal to moderate amount of bilateral atherosclerotic plaque, left greater than right, progressed compared to the 12/2010 examination though not resulting in a hemodynamically significant stenosis within either internal carotid artery. Electronically Signed   By: Sandi Mariscal M.D.   On: 08/23/2018 08:34     ASSESSMENT AND PLAN:  78 year old male patient with history of diabetes mellitus type 2, coronary disease, non-STEMI,, diabetes mellitus type 2, GERD, hyperlipidemia, hypertension, peripheral vascular disease, rhabdomyolysis currently under hospitalist service  -Sepsis secondary to left leg infection Patient was not able to give any consent Amputation procedure could not be done today by vascular surgery Continue IV vancomycin and Zosyn antibiotics Infectious disease consult appreciated Aerobic cultures sent from the left foot  -Acute metabolic encephalopathy secondary to sepsis IV fluids and antibiotics  -MRSA and Morganella bacteremia Continue IV vancomycin antibiotic and Zosyn intravenously Further antibiotic recommendation as per infectious disease specialist  -Acute  frontal CVA Oral aspirin as tolerated Status post neurology evaluation Currently on neurochecks Carotid ultrasound showed bilateral minimal atherosclerotic disease Echocardiogram reviewed Supportive care  Physical therapy and speech therapy as feasible  -Elevated troponin Abnormal rhythms on telemetry monitor Could be from demand ischemia from sepsis In view of multiple medical problems and poor prognosis aggressive intervention has been deferred by cardiology Continue beta-blocker Statin cannot be given secondary to rhabdomyolysis Cardiology consult appreciated Follow-up with cardiology who did not recommend any acute intervention 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% Cardiac monitoring to continue  -Acute rhabdomyolysis improved CK levels improved Continue to hold statin medication  -Acute hypernatremia improving IV fluids switched to D5 normal saline Monitor electrolytes  -GI bleed Hemoglobin hematocrit appears to be stable Questionable etiology Drop in hemoglobin could be secondary to hemodilution Continue IV proton pump inhibitor Gastroenterology evaluation for supportive care Interventions might not be possible secondary to multiple medical problems and patient might not be able to withstand anesthesia Serial hemoglobin hematocrit monitoring Currently hemoglobin 12.8  -Acute renal insufficiency Improving Monitor renal function with IV fluids Avoid nephrotoxic medication  -Decubitus ulcer infected Wound care consult Surgery consult would debridement Broad-spectrum antibiotics  -Palliative care consult To address goals of care  -Overall prognosis prognosis poor No family member available to reach Nobody to make decisions No emergency contacts  -DNR by CODE STATUS  All the records are reviewed and case discussed with Care Management/Social Worker. Management plans discussed with the patient, family and they are in agreement.  CODE STATUS: DNR  DVT Prophylaxis: SCDs  TOTAL TIME TAKING CARE OF THIS PATIENT: 38 minutes.   POSSIBLE D/C IN 3 to 4 DAYS, DEPENDING ON CLINICAL CONDITION.  Spencer Edwards  M.D on 08/24/2018 at 12:12 PM  Between 7am to 6pm - Pager - [phone]  After 6pm go to [email or web address] - password [email or web address]  [facility] [place] [facility]  Office  [phone]  CC: Primary care physician; Patient, No Pcp Per  Note: This dictation was prepared with Dragon dictation along with smaller phrase technology. Any transcriptional errors that result from this process are unintentional.

## 2018-08-24 NOTE — Care Management Important Message (Signed)
Important Message  Patient Details  Name: Spencer Edwards MRN: [id number] Date of Birth: [birth year]   Medicare Important Message Given:  Other (see comment)  Unable to obtain signature.    Dannette Barbara 08/24/2018, 4:09 PM

## 2018-08-24 NOTE — Progress Notes (Signed)
Inpatient Diabetes Program Recommendations  AACE/ADA: New Consensus Statement on Inpatient Glycemic Control   Target Ranges:  Prepandial:   less than 140 mg/dL      Peak postprandial:   less than 180 mg/dL (1-2 hours)      Critically ill patients:  140 - 180 mg/dL  Results for ABDULAZIZ, TOMAN (MRN [id number]) as of 08/24/2018 08:04  Ref. Range 08/24/2018 04:32  Glucose Latest Ref Range: 70 - 99 mg/dL 412 (H)   Results for RAFIQ, BUCKLIN (MRN [id number]) as of 08/24/2018 08:04  Ref. Range 08/23/2018 08:51 08/23/2018 12:18 08/23/2018 16:30 08/23/2018 20:46  Glucose-Capillary Latest Ref Range: 70 - 99 mg/dL 227 (H) 285 (H) 365 (H) 310 (H)   Review of Glycemic Control  Diabetes history: DM2 Outpatient Diabetes medications: Lantus 24 units QHS Current orders for Inpatient glycemic control: Novolog 0-9 units TID with meals, Novolog 0-5 units QHS  Inpatient Diabetes Program Recommendations:   Insulin - Basal: Please consider ordering Lantus to 12 units daily starting now.  Thanks, Barnie Alderman, RN, MSN, CDE Diabetes Coordinator Inpatient Diabetes Program [phone] (Team Pager from 8am to 5pm)

## 2018-08-24 NOTE — Progress Notes (Signed)
ID Pt somnolent On calling he responds but incoherent Fast breathing Chest b/l air entry Patient Vitals for the past 24 hrs:  BP Temp Temp src Pulse Resp SpO2  08/24/18 1559 123/62 97.8 F (36.6 C) Oral (!) 104 17 95 %  08/24/18 0825 114/61 97.8 F (36.6 C) Oral 93 (!) 24 93 %  08/24/18 0523 128/65 98.1 F (36.7 C) Oral 100 20 94 %  08/24/18 0434 (!) 121/55 98.9 F (37.2 C) Axillary 95 20 94 %  08/24/18 0054 - 98.2 F (36.8 C) Oral (!) 101 - 94 %  08/24/18 0019 121/63 100 F (37.8 C) Oral 100 20 94 %  08/23/18 2313 - 100.1 F (37.8 C) Oral - - -    Left leg    CBC Latest Ref Rng & Units 08/24/2018 08/23/2018 08/22/2018  WBC 4.0 - 10.5 K/uL 16.5(H) 9.2 11.5(H)  Hemoglobin 13.0 - 17.0 g/dL 12.8(L) 12.7(L) 13.2  Hematocrit 39.0 - 52.0 % 41.0 40.1 42.6  Platelets 150 - 400 K/uL 200 185 209   CMP Latest Ref Rng & Units 08/24/2018 08/23/2018 08/22/2018  Glucose 70 - 99 mg/dL 412(H) 257(H) 162(H)  BUN 8 - 23 mg/dL 23 23 38(H)  Creatinine 0.61 - 1.24 mg/dL 1.18 0.86 0.96  Sodium 135 - 145 mmol/L 143 146(H) 150(H)  Potassium 3.5 - 5.1 mmol/L 3.7 3.6 3.8  Chloride 98 - 111 mmol/L 118(H) 118(H) 120(H)  CO2 22 - 32 mmol/L 21(L) 24 21(L)  Calcium 8.9 - 10.3 mg/dL 7.7(L) 7.9(L) 8.3(L)  Total Protein 6.5 - 8.1 g/dL - - -  Total Bilirubin 0.3 - 1.2 mg/dL - - -  Alkaline Phos 38 - 126 U/L - - -  AST 15 - 41 U/L - - -  ALT 0 - 44 U/L - - -   Micro: 7/9 BC -Morganella and coag neg species   Impression/Recommendation Self neglect with multiple wounds Left leg necrotic areas, with severe infection- on vanco , ceftriaxone and flagyl- changed to zosyn  Morganella and coag neg staph ( NOT MRSA) bacteremia- the coag eg staph is a contaminant On zosyn Somnolent/- New CVA   Increasing leucocytosis- likely worsening infection leg= he needs BKA but not clinically stable  Poor prognosis- palliative to follow up to address treatment goals  Discussed the management with the nurse

## 2018-08-24 NOTE — Progress Notes (Signed)
SLP Cancellation Note  Patient Details Name: Spencer Edwards MRN: [id number] DOB: [birth year]   Cancelled treatment:       Reason Eval/Treat Not Completed: Patient at procedure or test/unavailable(chart reviewed; consulted NSG. ). Currently, pt is NPO for Vascular procedure today. ST services will f/u w/ pt's status tomorrow. Recommend oral care while NPO.    Orinda Kenner, MS, CCC-SLP Sahmir Weatherbee 08/24/2018, 1:42 PM

## 2018-08-24 NOTE — TOC Progression Note (Signed)
Transition of Care [facility]) - Progression Note    Patient Details  Name: Spencer Edwards MRN: [id number] Date of Birth: Nov 27, 1940  Transition of Care [facility]) CM/SW Contact  Jiraiya Mcewan, Lenice Llamas Phone Number: [phone]  08/24/2018, 4:32 PM  Clinical Narrative: Clinical Social Worker (CSW) faxed requested clinicals to APS worker.     Expected Discharge Plan: [place] Barriers to Discharge: Continued Medical Work up  Expected Discharge Plan and Services Expected Discharge Plan: [place]   Discharge Planning Services: CM Consult   Living arrangements for the past 2 months: Single Family Home                                       Social Determinants of Health (SDOH) Interventions    Readmission Risk Interventions No flowsheet data found.

## 2018-08-24 NOTE — Progress Notes (Signed)
[facility] Daily Progress Note   Subjective: Patient lethargic and unable to respond to questions this AM  Objective: Vitals:   08/24/18 0054 08/24/18 0434 08/24/18 0523 08/24/18 0825  BP:  (!) 121/55 128/65 114/61  Pulse: (!) 101 95 100 93  Resp:  20 20 (!) 24  Temp: 98.2 F (36.8 C) 98.9 F (37.2 C) 98.1 F (36.7 C) 97.8 F (36.6 C)  TempSrc: Oral Axillary Oral Oral  SpO2: 94% 94% 94% 93%  Weight:      Height:        Intake/Output Summary (Last 24 hours) at 08/24/2018 1232 Last data filed at 08/24/2018 7341 Gross per 24 hour  Intake 2092.09 ml  Output -  Net 2092.09 ml   Physical Exam: A&Ox0, NAD CV: RRR Pulmonary: CTA Bilaterally Abdomen: Soft, Nontender, Nondistended Vascular:  Left Lower Extremity: Thigh soft. Calf soft. Nonpalpable pedal pulses. Foot is cold. Toes 1-3 without gangrene.  Ulceration on the dorsal aspect of foot.  Heel with developing eschar.  Skin tears in the beginning of ulceration tracking up calf.  Right Lower Extremity: BKA stump healthy and intact.    Laboratory: CBC    Component Value Date/Time   WBC 16.5 (H) 08/24/2018 0432   HGB 12.8 (L) 08/24/2018 0432   HGB 12.7 (L) 01/10/2014 0456   HCT 41.0 08/24/2018 0432   HCT 39.1 (L) 01/10/2014 0456   PLT 200 08/24/2018 0432   PLT 232 01/10/2014 0456   BMET    Component Value Date/Time   NA 143 08/24/2018 0432   NA 145 01/14/2014 0521   K 3.7 08/24/2018 0432   K 3.8 01/14/2014 0521   CL 118 (H) 08/24/2018 0432   CL 107 01/14/2014 0521   CO2 21 (L) 08/24/2018 0432   CO2 31 01/14/2014 0521   GLUCOSE 412 (H) 08/24/2018 0432   GLUCOSE 67 01/14/2014 0521   BUN 23 08/24/2018 0432   BUN 29 (H) 01/14/2014 0521   CREATININE 1.18 08/24/2018 0432   CREATININE 1.36 (H) 01/14/2014 0521   CALCIUM 7.7 (L) 08/24/2018 0432   CALCIUM 8.2 (L) 01/14/2014 0521   GFRNONAA 58 (L) 08/24/2018 0432   GFRNONAA 54 (L) 01/14/2014 0521   GFRNONAA >60 07/15/2013 0809   GFRAA >60  08/24/2018 0432   GFRAA >60 01/14/2014 0521   GFRAA >60 07/15/2013 0809   Assessment/Planning: The patient is a 78 year old male with multiple medical issues including gangrenous wounds to the left lower extremity 1) patient still with multiple acute medical issues.  Unstable for angiogram today with Dr. Lucky Cowboy. 2) unsure as to what the patient's wishes are moving forward.  At this point, may need to undergo a guillotine amputation through the knee as an emergent operation due to the patient being so unstable and bring him back at a later date for a revision AKA.  A below the knee amputation may not be feasible due to the extent of the patient's wounds tracking up his calf.  Discussed with Dew / Tamera Stands PA-C 08/24/2018 12:32 PM

## 2018-08-24 NOTE — TOC Progression Note (Addendum)
Transition of Care [facility]) - Progression Note    Patient Details  Name: Spencer Edwards MRN: [id number] Date of Birth: [birth year]  Transition of Care [facility]) CM/SW Contact  Akaysha Cobern, Lenice Llamas Phone Number: [phone]  08/24/2018, 2:37 PM  Clinical Narrative: Clinical Social Worker (CSW) attempted to meet with patient today however he was not alert and would not open his eyes. CSW made an Adult Scientist, forensic (APS) report in [facility] because patient was found down in his home after 4 days by meals on wheels. RN case manager Colletta Maryland also made an APS report. APS report has been accepted and APS worker is Nkechi. CSW emailed APS worker and left her a Advertising account executive. Per chart patient has no emergency contacts. Per ED notes patient has 2 friends Herbie Baltimore and Salli Real [phone], [phone]. CSW attempted to contact Herbie Baltimore and Santiago Glad however they did not answer and a voicemail was left. CSW will continue to follow and assist as needed.   Patient's friend Loyola Mast called CSW back and explained that his wife Santiago Glad first met patient when she was taking care of him as a [employer] employee. Per Herbie Baltimore and Santiago Glad patient has no family to make decisions for him. Per Herbie Baltimore and Santiago Glad they are willing to make decisions for patient. CSW made Herbie Baltimore and Santiago Glad aware that an APS report as been made. CSW will continue to follow and assist as needed.     Expected Discharge Plan: [place] Barriers to Discharge: Continued Medical Work up  Expected Discharge Plan and Services Expected Discharge Plan: [place]   Discharge Planning Services: CM Consult   Living arrangements for the past 2 months: Single Family Home                                       Social Determinants of Health (SDOH) Interventions    Readmission Risk Interventions No flowsheet data found.

## 2018-08-24 NOTE — Progress Notes (Signed)
Pt has been lethargic today. Periods of heavy breathing foll by  Slight periods of apnea. Responds when spoken to. Not able to take po meds today. Inf disease saw pt today.

## 2018-08-24 NOTE — Progress Notes (Signed)
OT Cancellation Note  Patient Details Name: Spencer Edwards MRN: [id number] DOB: Oct 12, 1940   Cancelled Treatment:    Reason Eval/Treat Not Completed: Patient declined, no reason specified. Order received and chart reviewed. Upon arrival to pt room, pt supine in bed, breathing heavily through his mouth, appears to be asleep. Rouses with VCs, but keeps eyes closed. Pt declines to participate in OT eval on this date. States he is feeling tired and "not today". Will follow acutely and re-attempt at a later time/date as available and pt medically appropriate for OT eval.  Shara Blazing, M.S., OTR/L Ascom: [phone] 08/24/18, 1:15 PM

## 2018-08-25 DIAGNOSIS — R410 Disorientation, unspecified: Secondary | ICD-10-CM

## 2018-08-25 LAB — CULTURE, BLOOD (ROUTINE X 2): Culture: NO GROWTH

## 2018-08-25 LAB — GLUCOSE, CAPILLARY
Glucose-Capillary: 204 mg/dL — ABNORMAL HIGH (ref 70–99)
Glucose-Capillary: 209 mg/dL — ABNORMAL HIGH (ref 70–99)
Glucose-Capillary: 241 mg/dL — ABNORMAL HIGH (ref 70–99)
Glucose-Capillary: 272 mg/dL — ABNORMAL HIGH (ref 70–99)
Glucose-Capillary: 303 mg/dL — ABNORMAL HIGH (ref 70–99)

## 2018-08-25 LAB — CBC
HCT: 42 % (ref 39.0–52.0)
Hemoglobin: 13 g/dL (ref 13.0–17.0)
MCH: 27.2 pg (ref 26.0–34.0)
MCHC: 31 g/dL (ref 30.0–36.0)
MCV: 87.9 fL (ref 80.0–100.0)
Platelets: 202 10*3/uL (ref 150–400)
RBC: 4.78 MIL/uL (ref 4.22–5.81)
RDW: 14.1 % (ref 11.5–15.5)
WBC: 17.6 10*3/uL — ABNORMAL HIGH (ref 4.0–10.5)
nRBC: 0 % (ref 0.0–0.2)

## 2018-08-25 LAB — BASIC METABOLIC PANEL
Anion gap: 6 (ref 5–15)
BUN: 24 mg/dL — ABNORMAL HIGH (ref 8–23)
CO2: 21 mmol/L — ABNORMAL LOW (ref 22–32)
Calcium: 8 mg/dL — ABNORMAL LOW (ref 8.9–10.3)
Chloride: 123 mmol/L — ABNORMAL HIGH (ref 98–111)
Creatinine, Ser: 1.43 mg/dL — ABNORMAL HIGH (ref 0.61–1.24)
GFR calc Af Amer: 54 mL/min — ABNORMAL LOW (ref >60)
GFR calc non Af Amer: 46 mL/min — ABNORMAL LOW (ref >60)
Glucose, Bld: 209 mg/dL — ABNORMAL HIGH (ref 70–99)
Potassium: 3.5 mmol/L (ref 3.5–5.1)
Sodium: 150 mmol/L — ABNORMAL HIGH (ref 135–145)

## 2018-08-25 MED ORDER — DEXTROSE-NACL 5-0.45 % IV SOLN
INTRAVENOUS | Status: DC
  Administered 2018-08-25 – 2018-08-26 (×3): via INTRAVENOUS

## 2018-08-25 MED ORDER — POTASSIUM CHLORIDE CRYS ER 20 MEQ PO TBCR
40.0000 meq | EXTENDED_RELEASE_TABLET | Freq: Once | ORAL | Status: AC
  Administered 2018-08-25: 40 meq via ORAL
  Filled 2018-08-25: qty 2

## 2018-08-25 MED ORDER — SODIUM CHLORIDE 0.9 % IV SOLN
INTRAVENOUS | Status: DC | PRN
  Administered 2018-08-25: 250 mL via INTRAVENOUS

## 2018-08-25 MED ORDER — INSULIN GLARGINE 100 UNIT/ML ~~LOC~~ SOLN
15.0000 [IU] | Freq: Every day | SUBCUTANEOUS | Status: DC
  Administered 2018-08-26: 15 [IU] via SUBCUTANEOUS
  Filled 2018-08-25 (×3): qty 0.15

## 2018-08-25 NOTE — Consult Note (Signed)
[facility] Face[facility]to[facility] Consult   Reason for Consult: Consult received for 78 year old man in the hospital with gangrenous leg as a result of a fall.  Amputation scheduled for Friday.  Request for evaluation of capacity for decision Referring Physician:  Weiting Patient Identification: Spencer Edwards MRN:  [id number] Principal Diagnosis: <principal problem not specified> Diagnosis:  Active Problems:   Sepsis (HCC)   Pressure injury of skin   Diabetic foot infection (HCC)   Palliative care encounter   Total Time spent with patient: 30 minutes  Subjective:   Spencer Edwards is a 78 y.o. male patient admitted with "I fell".  HPI: Patient seen and chart reviewed.  This is a 78 year old man who was brought to the hospital on July 9 after being found in his home apparently having suffered a fall and been immobile for some time.  On presentation left leg was already infected and gangrenous.  Patient has now been stabilized to the point that amputation can be tolerated.  As I understand it amputation is scheduled for Friday.  On interview the patient was initially asleep but easy to arouse.  He was pleasant attentive and cooperative during the interview.  He was able to tell me where he was.  He was able to tell me that he was here because of a fall.  Patient at times was having difficulty finding specific words but when I ask him what kind of an operation was being proposed he said an amputation.  He was able to tell me in normal language what that meant.  Patient was not able to tell me in his own words what the reason was for the amputation.  I explained to him that his leg was infected with dead tissue that could not be recovered and if the amputation was not done he was very likely to die of the infection spreading.  Patient was able to process that and understand what I was talking about.  Patient stated that he was agreeable to having the amputation done.  He denied being depressed.   Denied having suicidal thoughts.  He was alert and attentive throughout.  Did not appear to be having any psychotic symptoms.  I did not do a full mental status exam.  Patient tells me that he believes that he had a stroke recently and that he often has difficulty finding words but tells me he feels certain that he understands his current situation.   Past Psychiatric History: No known past psychiatric history.  Has had previous hospitalizations and multiple medical problems and it sounds like there have been times in the past when he has been delirious or close to it and may have some degree of dementia although this has not been specifically documented.  No previous psychiatric hospitalizations  Risk to Self:   Risk to Others:   Prior Inpatient Therapy:   Prior Outpatient Therapy:    Past Medical History:  Past Medical History:  Diagnosis Date   Acute kidney failure (HCC)    Anxiety disorder    Coronary artery disease    Non-ST elevation myocardial infarction in November 2015 in the setting of septic shock and multiorgan failure. Peak troponin was 25. Echo showed normal LV systolic function with inferior and posterior hypokinesis.   Diabetes mellitus without complication (HCC)    Dysphagia    Gastro-esophageal reflux    Hematuria    Hyperlipidemia    Hypernatremia    Hypertension    MI (myocardial infarction) (HCC)  Muscle weakness    PVD (peripheral vascular disease) (HCC)    Rhabdomyolysis    Urinary retention     Past Surgical History:  Procedure Laterality Date   ABDOMINAL SURGERY     leg amputation     Family History:  Family History  Problem Relation Age of Onset   Heart Problems Father    Heart disease Father    Family Psychiatric  History: None known Social History:  Social History   Substance and Sexual Activity  Alcohol Use No     Social History   Substance and Sexual Activity  Drug Use No    Social History   Socioeconomic  History   Marital status: Single    Spouse name: Not on file   Number of children: Not on file   Years of education: Not on file   Highest education level: Not on file  Occupational History   Not on file  Social Needs   Financial resource strain: Not on file   Food insecurity    Worry: Not on file    Inability: Not on file   Transportation needs    Medical: Not on file    Non-medical: Not on file  Tobacco Use   Smoking status: Former Smoker    Types: Cigarettes   Smokeless tobacco: Never Used  Substance and Sexual Activity   Alcohol use: No   Drug use: No   Sexual activity: Not on file  Lifestyle   Physical activity    Days per week: Not on file    Minutes per session: Not on file   Stress: Not on file  Relationships   Social connections    Talks on phone: Not on file    Gets together: Not on file    Attends religious service: Not on file    Active member of club or organization: Not on file    Attends meetings of clubs or organizations: Not on file    Relationship status: Not on file  Other Topics Concern   Not on file  Social History Narrative   Not on file   Additional Social History:    Allergies:  No Known Allergies  Labs:  Results for orders placed or performed during the hospital encounter of 08/20/18 (from the past 48 hour(s))  Vancomycin, peak     Status: None   Collection Time: 08/23/18  7:32 PM  Result Value Ref Range   Vancomycin Pk 32 30 - 40 ug/mL    Comment: Performed at [facility], [address] [place], [place] [address]  Glucose, capillary     Status: Abnormal   Collection Time: 08/23/18  8:46 PM  Result Value Ref Range   Glucose-Capillary 310 (H) 70 - 99 mg/dL  Magnesium     Status: None   Collection Time: 08/24/18  4:32 AM  Result Value Ref Range   Magnesium 2.3 1.7 - 2.4 mg/dL    Comment: Performed at [facility], [address] [place], [place] [address]  CBC     Status: Abnormal    Collection Time: 08/24/18  4:32 AM  Result Value Ref Range   WBC 16.5 (H) 4.0 - 10.5 K/uL   RBC 4.77 4.22 - 5.81 MIL/uL   Hemoglobin 12.8 (L) 13.0 - 17.0 g/dL   HCT 52.8 41.3 - 24.4 %   MCV 86.0 80.0 - 100.0 fL   MCH 26.8 26.0 - 34.0 pg   MCHC 31.2 30.0 - 36.0 g/dL  RDW 13.7 11.5 - 15.5 %   Platelets 200 150 - 400 K/uL   nRBC 0.0 0.0 - 0.2 %    Comment: Performed at [facility], [address] [place], [place] [address]  Basic metabolic panel     Status: Abnormal   Collection Time: 08/24/18  4:32 AM  Result Value Ref Range   Sodium 143 135 - 145 mmol/L   Potassium 3.7 3.5 - 5.1 mmol/L   Chloride 118 (H) 98 - 111 mmol/L   CO2 21 (L) 22 - 32 mmol/L   Glucose, Bld 412 (H) 70 - 99 mg/dL   BUN 23 8 - 23 mg/dL   Creatinine, Ser 4.781.18 0.61 - 1.24 mg/dL   Calcium 7.7 (L) 8.9 - 10.3 mg/dL   GFR calc non Af Amer 58 (L) >60 mL/min   GFR calc Af Amer >60 >60 mL/min   Anion gap 4 (L) 5 - 15    Comment: Performed at [facility], [address] [place], [place] [address]  Glucose, capillary     Status: Abnormal   Collection Time: 08/24/18  9:10 AM  Result Value Ref Range   Glucose-Capillary 461 (H) 70 - 99 mg/dL  Glucose, capillary     Status: Abnormal   Collection Time: 08/24/18 11:59 AM  Result Value Ref Range   Glucose-Capillary 369 (H) 70 - 99 mg/dL  Vancomycin, trough     Status: None   Collection Time: 08/24/18  2:41 PM  Result Value Ref Range   Vancomycin Tr 16 15 - 20 ug/mL    Comment: Performed at [facility], [address] [place], [place] [address]  Glucose, capillary     Status: Abnormal   Collection Time: 08/24/18  5:14 PM  Result Value Ref Range   Glucose-Capillary 246 (H) 70 - 99 mg/dL  Glucose, capillary     Status: Abnormal   Collection Time: 08/24/18  8:49 PM  Result Value Ref Range   Glucose-Capillary 204 (H) 70 - 99 mg/dL  Glucose, capillary     Status: Abnormal   Collection Time: 08/24/18 11:15 PM  Result Value Ref Range    Glucose-Capillary 212 (H) 70 - 99 mg/dL  CBC     Status: Abnormal   Collection Time: 08/25/18  4:26 AM  Result Value Ref Range   WBC 17.6 (H) 4.0 - 10.5 K/uL   RBC 4.78 4.22 - 5.81 MIL/uL   Hemoglobin 13.0 13.0 - 17.0 g/dL   HCT 57.842.0 46.939.0 - 62.952.0 %   MCV 87.9 80.0 - 100.0 fL   MCH 27.2 26.0 - 34.0 pg   MCHC 31.0 30.0 - 36.0 g/dL   RDW 52.814.1 41.311.5 - 24.415.5 %   Platelets 202 150 - 400 K/uL   nRBC 0.0 0.0 - 0.2 %    Comment: Performed at [facility], [address] [place], [place] [address]  Basic metabolic panel     Status: Abnormal   Collection Time: 08/25/18  4:26 AM  Result Value Ref Range   Sodium 150 (H) 135 - 145 mmol/L   Potassium 3.5 3.5 - 5.1 mmol/L   Chloride 123 (H) 98 - 111 mmol/L   CO2 21 (L) 22 - 32 mmol/L   Glucose, Bld 209 (H) 70 - 99 mg/dL   BUN 24 (H) 8 - 23 mg/dL   Creatinine, Ser 2.531.43 (H) 0.61 - 1.24 mg/dL   Calcium 8.0 (L) 8.9 - 10.3 mg/dL   GFR calc non Af Amer 46 (L) >  60 mL/min   GFR calc Af Amer 54 (L) >60 mL/min   Anion gap 6 5 - 15    Comment: Performed at [facility], [address] [place], [place] [address]  Glucose, capillary     Status: Abnormal   Collection Time: 08/25/18  7:41 AM  Result Value Ref Range   Glucose-Capillary 209 (H) 70 - 99 mg/dL  Glucose, capillary     Status: Abnormal   Collection Time: 08/25/18 11:42 AM  Result Value Ref Range   Glucose-Capillary 241 (H) 70 - 99 mg/dL  Glucose, capillary     Status: Abnormal   Collection Time: 08/25/18  4:40 PM  Result Value Ref Range   Glucose-Capillary 272 (H) 70 - 99 mg/dL    Current Facility-Administered Medications  Medication Dose Route Frequency Provider Last Rate Last Dose   acetaminophen (TYLENOL) tablet 650 mg  650 mg Oral Q6H PRN Loletha Grayer, MD   650 mg at 08/23/18 2111   Or   acetaminophen (TYLENOL) suppository 650 mg  650 mg Rectal Q6H PRN Loletha Grayer, MD       ALPRAZolam Duanne Moron) tablet 0.25 mg  0.25 mg Oral TID Loletha Grayer, MD   0.25  mg at 08/23/18 2111   aspirin tablet 325 mg  325 mg Oral Daily Pyreddy, Reatha Harps, MD   325 mg at 08/25/18 1445   bisacodyl (DULCOLAX) suppository 10 mg  10 mg Rectal PRN Loletha Grayer, MD   10 mg at 08/23/18 0859   dextrose 5 %-0.45 % sodium chloride infusion   Intravenous Continuous Pyreddy, Reatha Harps, MD 75 mL/hr at 08/24/18 1338     dextrose 5 %-0.45 % sodium chloride infusion   Intravenous Continuous Pyreddy, Reatha Harps, MD 75 mL/hr at 08/25/18 1700     food thickener (THICK IT) powder   Oral PRN Pyreddy, Reatha Harps, MD       guaiFENesin-dextromethorphan (ROBITUSSIN DM) 100-10 MG/5ML syrup 5 mL  5 mL Oral Q4H PRN Pyreddy, Reatha Harps, MD   5 mL at 08/23/18 0121   insulin aspart (novoLOG) injection 0-5 Units  0-5 Units Subcutaneous QHS Loletha Grayer, MD   2 Units at 08/24/18 2319   insulin aspart (novoLOG) injection 0-9 Units  0-9 Units Subcutaneous TID WC Loletha Grayer, MD   5 Units at 08/25/18 1701   [START ON 08/26/2018] insulin glargine (LANTUS) injection 15 Units  15 Units Subcutaneous Daily Pyreddy, Pavan, MD       ipratropium-albuterol (DUONEB) 0.5-2.5 (3) MG/3ML nebulizer solution 3 mL  3 mL Nebulization Q4H PRN Wieting, Yerachmiel, MD       ondansetron (ZOFRAN) tablet 4 mg  4 mg Oral Q6H PRN Loletha Grayer, MD       Or   ondansetron (ZOFRAN) injection 4 mg  4 mg Intravenous Q6H PRN Wieting, Kenyatte, MD       oxyCODONE (Oxy IR/ROXICODONE) immediate release tablet 5 mg  5 mg Oral Q4H PRN Wieting, Donovan, MD       pantoprazole (PROTONIX) injection 40 mg  40 mg Intravenous Q12H Pyreddy, Reatha Harps, MD   40 mg at 08/25/18 0821   piperacillin-tazobactam (ZOSYN) IVPB 3.375 g  3.375 g Intravenous Q8H Charlett Nose, RPH 12.5 mL/hr at 08/25/18 1450 3.375 g at 08/25/18 1450   tamsulosin (FLOMAX) capsule 0.4 mg  0.4 mg Oral Daily Loletha Grayer, MD   0.4 mg at 08/25/18 1445    Musculoskeletal: Strength & Muscle Tone: decreased Gait & Station: unable to stand Patient leans:  N/A  Psychiatric Specialty Exam: Physical  Exam  Nursing note and vitals reviewed. Constitutional: He appears well-developed and well-nourished.  HENT:  Head: Normocephalic and atraumatic.  Eyes: Pupils are equal, round, and reactive to light. Conjunctivae are normal.  Neck: Normal range of motion.  Cardiovascular: Regular rhythm and normal heart sounds.  Respiratory: Effort normal. No respiratory distress.  GI: Soft.  Musculoskeletal: Normal range of motion.       Feet:  Neurological: He is alert.  Skin: Skin is warm and dry.  Psychiatric: Judgment normal. His affect is blunt. His speech is delayed. He is slowed. Thought content is not paranoid. Cognition and memory are impaired. He expresses no homicidal and no suicidal ideation. He exhibits abnormal recent memory and abnormal remote memory.    Review of Systems  Constitutional: Negative.   HENT: Negative.   Eyes: Negative.   Respiratory: Negative.   Cardiovascular: Negative.   Gastrointestinal: Negative.   Musculoskeletal: Negative.   Skin: Negative.   Neurological: Negative.   Psychiatric/Behavioral: Positive for memory loss. Negative for depression, hallucinations, substance abuse and suicidal ideas. The patient is not nervous/anxious and does not have insomnia.     Blood pressure (!) 108/58, pulse 92, temperature 98.3 F (36.8 C), temperature source Oral, resp. rate 16, height 5\' 10"  (1.778 m), weight 83 kg, SpO2 97 %.Body mass index is 26.26 kg/m.  General Appearance: Casual  Eye Contact:  Fair  Speech:  Slow  Volume:  Decreased  Mood:  Euthymic  Affect:  Constricted  Thought Process:  Coherent  Orientation:  Full (Time, Place, and Person)  Thought Content:  Logical  Suicidal Thoughts:  No  Homicidal Thoughts:  No  Memory:  Immediate;   Fair Recent;   Poor Remote;   Fair  Judgement:  Fair  Insight:  Fair  Psychomotor Activity:  Decreased  Concentration:  Concentration: Fair  Recall:  [employer]  Fund of  Knowledge:  Fair  Language:  Fair  Akathisia:  No  Handed:  Right  AIMS (if indicated):     Assets:  Desire for Improvement Resilience  ADL's:  Impaired  Cognition:  Impaired,  Mild  Sleep:        Treatment Plan Summary: Plan 78 year old man with no past psychiatric history.  According to the chartThere is a plan already in place for the Department of Social Services to pursue emergency guardianship.  If this was completed it would render the question of his capacity moved.  Currently however on my interview this evening I found the patient to meet the criteria to have capacity to make the decision on his surgery.  He was able to understand basically what the situation medically was and what the procedure was that was being proposed.  He was able to understand the rationale for it and the risks of not pursuing it.  Capacity is subject to change particularly in acutely ill people who may become delirious or more cognitively impaired from worsening medical conditions.  If questions arise again about capacity he can be reassessed.  At this point however I think that he would be able to give consent for this urgent surgery.  Patient does not appear to have depression or psychotic disorder does not require any other psychiatric intervention at this point.  We will sign off after this although as mentioned above if reassessment is needed please contact us.  Disposition: No evidence of imminent risk to self or others at present.    Mordecai RasmussenJohn Durk Carmen, MD 08/25/2018 5:53 PM

## 2018-08-25 NOTE — Progress Notes (Signed)
Palliative Medicine [facility]  Telephone:([phone] [phone]   Name: Spencer Edwards Date: 08/25/2018 MRN: [id number]  DOB: 10-11-1940  Patient Care Team: Patient, No Pcp Per as PCP - General (General Practice)    REASON FOR CONSULTATION: Palliative Care consult requested for this 78 y.o. male with multiple medical problems including diabetes, CAD, and PVD status post right BKA.  Patient was admitted to the hospital on 08/20/2018 with sepsis from lower extremity infection.  Patient apparently was found at home on the floor after lying there for 4 days.  Patient is currently being medically managed for sepsis but there is concerned that he may need future amputation.  Palliative care was consulted to help address goals.  CODE STATUS: DNR  PAST MEDICAL HISTORY: Past Medical History:  Diagnosis Date  . Acute kidney failure (HCC)   . Anxiety disorder   . Coronary artery disease    Non-ST elevation myocardial infarction in November 2015 in the setting of septic shock and multiorgan failure. Peak troponin was 25. Echo showed normal LV systolic function with inferior and posterior hypokinesis.  . Diabetes mellitus without complication (HCC)   . Dysphagia   . Gastro-esophageal reflux   . Hematuria   . Hyperlipidemia   . Hypernatremia   . Hypertension   . MI (myocardial infarction) (HCC)   . Muscle weakness   . PVD (peripheral vascular disease) (HCC)   . Rhabdomyolysis   . Urinary retention     PAST SURGICAL HISTORY:  Past Surgical History:  Procedure Laterality Date  . ABDOMINAL SURGERY    . leg amputation      HEMATOLOGY/ONCOLOGY HISTORY:  Oncology History   No history exists.    ALLERGIES:  has No Known Allergies.  MEDICATIONS:  Current Facility-Administered Medications  Medication Dose Route Frequency Provider Last Rate Last Dose  . acetaminophen (TYLENOL) tablet 650 mg  650 mg Oral Q6H PRN Alford Highland, MD   650 mg at  08/23/18 2111   Or  . acetaminophen (TYLENOL) suppository 650 mg  650 mg Rectal Q6H PRN Alford Highland, MD      . ALPRAZolam Prudy Feeler) tablet 0.25 mg  0.25 mg Oral TID Alford Highland, MD   0.25 mg at 08/23/18 2111  . aspirin tablet 325 mg  325 mg Oral Daily Ihor Austin, MD   325 mg at 08/23/18 0844  . bisacodyl (DULCOLAX) suppository 10 mg  10 mg Rectal PRN Alford Highland, MD   10 mg at 08/23/18 0859  . dextrose 5 %-0.45 % sodium chloride infusion   Intravenous Continuous Ihor Austin, MD 75 mL/hr at 08/24/18 1338    . dextrose 5 %-0.45 % sodium chloride infusion   Intravenous Continuous Ihor Austin, MD 75 mL/hr at 08/25/18 0830    . food thickener (THICK IT) powder   Oral PRN Pyreddy, Vivien Rota, MD      . guaiFENesin-dextromethorphan (ROBITUSSIN DM) 100-10 MG/5ML syrup 5 mL  5 mL Oral Q4H PRN Ihor Austin, MD   5 mL at 08/23/18 0121  . insulin aspart (novoLOG) injection 0-5 Units  0-5 Units Subcutaneous QHS Alford Highland, MD   2 Units at 08/24/18 2319  . insulin aspart (novoLOG) injection 0-9 Units  0-9 Units Subcutaneous TID WC Alford Highland, MD   3 Units at 08/25/18 [phone]  . [START ON 08/26/2018] insulin glargine (LANTUS) injection 15 Units  15 Units Subcutaneous Daily Pyreddy, Pavan, MD      . ipratropium-albuterol (DUONEB) 0.5-2.5 (3) MG/3ML nebulizer solution  3 mL  3 mL Nebulization Q4H PRN Alford HighlandWieting, Glade, MD      . ondansetron [facility](ZOFRAN) tablet 4 mg  4 mg Oral Q6H PRN Alford HighlandWieting, Timotheus, MD       Or  . ondansetron (ZOFRAN) injection 4 mg  4 mg Intravenous Q6H PRN Wieting, Ercel, MD      . oxyCODONE (Oxy IR/ROXICODONE) immediate release tablet 5 mg  5 mg Oral Q4H PRN Wieting, Majour, MD      . pantoprazole (PROTONIX) injection 40 mg  40 mg Intravenous Q12H Ihor AustinPyreddy, Pavan, MD   40 mg at 08/25/18 0821  . piperacillin-tazobactam (ZOSYN) IVPB 3.375 g  3.375 g Intravenous Q8H Bertram SavinSimpson, Michael L, RPH 12.5 mL/hr at 08/25/18 0502 3.375 g at 08/25/18 0502  . potassium chloride SA  (K-DUR) CR tablet 40 mEq  40 mEq Oral Once Pyreddy, Vivien RotaPavan, MD      . tamsulosin (FLOMAX) capsule 0.4 mg  0.4 mg Oral Daily Alford HighlandWieting, Klint, MD   0.4 mg at 08/23/18 0844    VITAL SIGNS: BP 118/66   Pulse 97   Temp 98.9 F (37.2 C) (Oral)   Resp 20   Ht 5\' 10"  (1.778 m)   Wt 182 lb 15.7 oz (83 kg)   SpO2 96%   BMI 26.26 kg/m  Filed Weights   08/20/18 1326  Weight: 182 lb 15.7 oz (83 kg)    Estimated body mass index is 26.26 kg/m as calculated from the following:   Height as of this encounter: 5\' 10"  (1.778 m).   Weight as of this encounter: 182 lb 15.7 oz (83 kg).  LABS: CBC:    Component Value Date/Time   WBC 17.6 (H) 08/25/2018 0426   HGB 13.0 08/25/2018 0426   HGB 12.7 (L) 01/10/2014 0456   HCT 42.0 08/25/2018 0426   HCT 39.1 (L) 01/10/2014 0456   PLT 202 08/25/2018 0426   PLT 232 01/10/2014 0456   MCV 87.9 08/25/2018 0426   MCV 87 01/10/2014 0456   NEUTROABS 12.6 (H) 08/20/2018 1314   NEUTROABS 14.8 (H) 01/10/2014 0456   LYMPHSABS 0.8 08/20/2018 1314   LYMPHSABS 0.9 (L) 01/10/2014 0456   MONOABS 1.3 (H) 08/20/2018 1314   MONOABS 1.7 (H) 01/10/2014 0456   EOSABS 0.0 08/20/2018 1314   EOSABS 0.2 01/10/2014 0456   BASOSABS 0.1 08/20/2018 1314   BASOSABS 0.1 01/10/2014 0456   Comprehensive Metabolic Panel:    Component Value Date/Time   NA 150 (H) 08/25/2018 0426   NA 145 01/14/2014 0521   K 3.5 08/25/2018 0426   K 3.8 01/14/2014 0521   CL 123 (H) 08/25/2018 0426   CL 107 01/14/2014 0521   CO2 21 (L) 08/25/2018 0426   CO2 31 01/14/2014 0521   BUN 24 (H) 08/25/2018 0426   BUN 29 (H) 01/14/2014 0521   CREATININE 1.43 (H) 08/25/2018 0426   CREATININE 1.36 (H) 01/14/2014 0521   GLUCOSE 209 (H) 08/25/2018 0426   GLUCOSE 67 01/14/2014 0521   CALCIUM 8.0 (L) 08/25/2018 0426   CALCIUM 8.2 (L) 01/14/2014 0521   AST 52 (H) 08/20/2018 1314   AST 91 (H) 01/02/2014 0510   ALT 52 (H) 08/20/2018 1314   ALT 64 (H) 01/02/2014 0510   ALKPHOS 88 08/20/2018 1314    ALKPHOS 70 01/02/2014 0510   BILITOT 1.7 (H) 08/20/2018 1314   BILITOT 1.7 (H) 01/02/2014 0510   PROT 7.6 08/20/2018 1314   PROT 5.1 (L) 01/02/2014 0510   ALBUMIN 3.2 (L) 08/20/2018 1314  ALBUMIN 1.8 (L) 01/04/2014 0315    RADIOGRAPHIC STUDIES: Dg Abd 1 View  Result Date: 08/22/2018 CLINICAL DATA:  Metal screening prior to MRI. EXAM: ABDOMEN - 1 VIEW COMPARISON:  January 01, 2014 FINDINGS: Numerous surgical clips are seen throughout the abdomen. An anastomotic ring is seen in the right lower quadrant. No other metal identified in the abdomen or pelvis. No other acute abnormalities. IMPRESSION: Surgical clips and an anastomotic ring in the abdomen. No other metal seen in the abdomen or pelvis. Electronically Signed   By: Dorise Bullion III M.D   On: 08/22/2018 13:28   Ct Head Wo Contrast  Result Date: 08/20/2018 CLINICAL DATA:  Status post fall, found down. Multiple soft tissue wounds in pressure sores. EXAM: CT HEAD WITHOUT CONTRAST TECHNIQUE: Contiguous axial images were obtained from the base of the skull through the vertex without intravenous contrast. COMPARISON:  Head CT dated 12/30/2013. FINDINGS: Brain: Generalized age related parenchymal volume loss with commensurate dilatation of the ventricles and sulci. No mass, hemorrhage, edema or other evidence of acute parenchymal abnormality. No extra-axial hemorrhage. Vascular: No hyperdense vessel or unexpected calcification. Skull: Normal. Negative for fracture or focal lesion. Sinuses/Orbits: No acute finding. Other: None. IMPRESSION: No acute findings in the brain. No evidence of intracranial mass, hemorrhage or edema. Electronically Signed   By: Franki Cabot M.D.   On: 08/20/2018 14:06   Mr Brain Wo Contrast  Result Date: 08/22/2018 CLINICAL DATA:  Seizure, new.  Fall from bed 4 days ago. EXAM: MRI HEAD WITHOUT CONTRAST TECHNIQUE: Multiplanar, multiecho pulse sequences of the brain and surrounding structures were obtained without  intravenous contrast. COMPARISON:  CT head without contrast 08/20/2018 FINDINGS: Brain: A 4 mm acute cortical infarct is present in posterior left frontal lobe. T2 signal changes are associated with the area of acute infarction. No other acute infarct is present. Mild generalized atrophy is present. There is relatively minimal white matter disease. The ventricles are proportionate to the degree of atrophy. No significant extraaxial fluid collection is present. The internal auditory canals are within normal limits. The brainstem and cerebellum are within normal limits. Vascular: Flow is present in the major intracranial arteries. Skull and upper cervical spine: The craniocervical junction is normal. Upper cervical spine is within normal limits. Marrow signal is unremarkable. Sinuses/Orbits: The paranasal sinuses are clear. There is some fluid in the right mastoid air cells. No obstructing nasopharyngeal lesion is present. The globes and orbits are within normal limits. IMPRESSION: 1. 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 2. Otherwise normal MRI appearance of the brain for age. 3. Minimal right mastoid effusion. No obstructing nasopharyngeal lesion is present. This was not present on the prior CT. Electronically Signed   By: San Morelle M.D.   On: 08/22/2018 15:04   US Carotid Bilateral  Result Date: 08/23/2018 CLINICAL DATA:  CVA. History of hypertension, CAD, hyperlipidemia, diabetes and smoking. EXAM: BILATERAL CAROTID DUPLEX ULTRASOUND TECHNIQUE: Pearline Cables scale imaging, color Doppler and duplex ultrasound were performed of bilateral carotid and vertebral arteries in the neck. COMPARISON:  01/10/2011 FINDINGS: Criteria: Quantification of carotid stenosis is based on velocity parameters that correlate the residual internal carotid diameter with NASCET-based stenosis levels, using the diameter of the distal internal carotid lumen as the denominator for stenosis measurement. The  following velocity measurements were obtained: RIGHT ICA: 48/11 cm/sec CCA: 54/2 cm/sec SYSTOLIC ICA/CCA RATIO:  0.7 ECA: 98 cm/sec LEFT ICA: 89/23 cm/sec CCA: 70/6 cm/sec SYSTOLIC ICA/CCA RATIO:  1.6 ECA:  94 cm/sec RIGHT CAROTID ARTERY: There is a minimal amount of eccentric echogenic plaque within the right carotid bulb (images 14 and 15), progressed compared to the 12/2010 examination though not resulting in elevated peak systolic velocities within the interrogated course the right internal carotid artery to suggest a hemodynamically significant stenosis. RIGHT VERTEBRAL ARTERY:  Antegrade Flow LEFT CAROTID ARTERY: There is a moderate amount of eccentric echogenic plaque within the left carotid bulb (images 48), extending through the interrogated course of the left internal carotid artery (image 55), progressed compared to the 12/2010 examination, though not resulting in elevated peak systolic velocities within the interrogated course the left internal carotid artery to suggest a hemodynamically significant stenosis. LEFT VERTEBRAL ARTERY:  Antegrade Flow IMPRESSION: Minimal to moderate amount of bilateral atherosclerotic plaque, left greater than right, progressed compared to the 12/2010 examination though not resulting in a hemodynamically significant stenosis within either internal carotid artery. Electronically Signed   By: Simonne ComeJohn  Watts M.D.   On: 08/23/2018 08:34   Dg Chest Port 1 View  Result Date: 08/20/2018 CLINICAL DATA:  Fall. EXAM: PORTABLE CHEST 1 VIEW COMPARISON:  Radiograph of January 12, 2014. FINDINGS: The heart size and mediastinal contours are within normal limits. Both lungs are clear. No pneumothorax or pleural effusion is noted. The visualized skeletal structures are unremarkable. IMPRESSION: No active disease. Electronically Signed   By: Lupita RaiderJames  Green Jr M.D.   On: 08/20/2018 13:29   Dg Foot Complete Left  Result Date: 08/20/2018 CLINICAL DATA:  Open wound, concern for  fracture/osteomyelitis EXAM: LEFT FOOT - COMPLETE 3+ VIEW COMPARISON:  None. FINDINGS: There is an open wound of the tip of the left great toe with gross bony destruction of the exposed tip of the distal phalanx, consistent with osteomyelitis. There is a single interphalangeal joint of the second digit with dislocation of the distal phalanx. There may be additional bony erosion of the distal aspect of the proximal left fourth phalanx. There is diffuse soft tissue edema about the left foot with additional open wounds about the dorsum of the foot. IMPRESSION: There is an open wound of the tip of the left great toe with gross bony destruction of the exposed tip of the distal phalanx, consistent with osteomyelitis. There is a single interphalangeal joint of the second digit with dislocation of the distal phalanx. There may be additional bony erosion of the distal aspect of the proximal left fourth phalanx. There is diffuse soft tissue edema about the left foot with additional open wounds about the dorsum of the foot. Contrast enhanced MRI may be helpful to evaluate for extent of multifocal osteomyelitis. Electronically Signed   By: Lauralyn PrimesAlex  Bibbey M.D.   On: 08/20/2018 13:56    PERFORMANCE STATUS (ECOG) : 4 - Bedbound  Review of Systems Unless otherwise noted, a complete review of systems is negative.  Physical Exam General: NAD, frail appearing, thin Pulmonary: Unlabored Extremities: Right BKA, left leg with multiple wounds Skin: Wounds as noted above Neurological: Lethargic at times, oriented to person only  IMPRESSION: Follow-up visit today.  Patient has had some somnolence following acute CVA.  He appears more responsive today but is only oriented to person.  He fell asleep several times while trying to have a conversation.  I was unable to meaningfully address goals today.  Patient did previously tell me that he was interested in amputation if necessary.  I note plan for amputation if/when surgery is  able to consent.  Unfortunately, we do not know of any  family or friends to speak on his behalf.  Patient has clear history of self-neglect at home leading to his current condition.  Would expect that patient will likely need placement.  Case discussed with social work, who have contacted APS regarding potential protective order/guardianship.  PLAN: -Continue current scope of treatment -Appreciate social work assistance with decision-making via APS   Time Total: 25 minutes  Visit consisted of counseling and education dealing with the complex and emotionally intense issues of symptom management and palliative care in the setting of serious and potentially life-threatening illness.Greater than 50%  of this time was spent counseling and coordinating care related to the above assessment and plan.  Signed by: Laurette SchimkeJoshua Borders, PhD, NP-C [phone] (Work Cell)

## 2018-08-25 NOTE — TOC Progression Note (Signed)
Transition of Care [facility]) - Progression Note    Patient Details  Name: Spencer Edwards MRN: [id number] Date of Birth: [birth year]  Transition [facility]) CM/SW Contact  Ladoris Lythgoe, Lenice Llamas Phone Number: [phone]  08/25/2018, 3:15 PM  Clinical Narrative: Clinical Social Worker (CSW) left a Advertising account executive for Herbalist.      Expected Discharge Plan: [place] Barriers to Discharge: Continued Medical Work up  Expected Discharge Plan and Services Expected Discharge Plan: [place]   Discharge Planning Services: CM Consult   Living arrangements for the past 2 months: Single Family Home                                       Social Determinants of Health (SDOH) Interventions    Readmission Risk Interventions No flowsheet data found.

## 2018-08-25 NOTE — Progress Notes (Signed)
Inpatient Diabetes Program Recommendations  AACE/ADA: New Consensus Statement on Inpatient Glycemic Control   Target Ranges:  Prepandial:   less than 140 mg/dL      Peak postprandial:   less than 180 mg/dL (1-2 hours)      Critically ill patients:  140 - 180 mg/dL   Results for THAILAND, DUBE (MRN [id number]) as of 08/25/2018 08:46  Ref. Range 08/24/2018 09:10 08/24/2018 11:59 08/24/2018 17:14 08/24/2018 20:49 08/24/2018 23:15 08/25/2018 07:41  Glucose-Capillary Latest Ref Range: 70 - 99 mg/dL 461 (H) 369 (H) 246 (H) 204 (H) 212 (H) 209 (H)   Review of Glycemic Control  iabetes history:DM2 Outpatient Diabetes medications:Lantus 24 units QHS Current orders for Inpatient glycemic control:Lantus 12 units daily, Novolog 0-9 units TID with meals, Novolog 0-5 units QHS  Inpatient Diabetes Program Recommendations:  Insulin - Basal: Please consider increasing Lantus to 15 units daily.   Thanks, Barnie Alderman, RN, MSN, CDE Diabetes Coordinator Inpatient Diabetes Program [phone] (Team Pager from 8am to 5pm)

## 2018-08-25 NOTE — Progress Notes (Addendum)
[facility] [place] NAME: Daeshaun Specht    MR#:  [id number]  DATE OF BIRTH:  [birth year]  SUBJECTIVE:  CHIEF COMPLAINT:   Chief Complaint  Patient presents with  . Fall  Patient seen today Off oxygen Not a great historian No complaints of chest pain No complains of any shortness of breath More awake and responds to all verbal commands  Current diet level 1 dysphagia diet with nectar thick liquids but npo currently for possible procedure REVIEW OF SYSTEMS:    ROS  CONSTITUTIONAL: No documented fever. Has fatigue, weakness. No weight gain, no weight loss.  EYES: No blurry or double vision.  ENT: No tinnitus. No postnasal drip. No redness of the oropharynx.  RESPIRATORY: No cough, no wheeze, no hemoptysis. No dyspnea.  CARDIOVASCULAR: No chest pain. No orthopnea. No palpitations. No syncope.  GASTROINTESTINAL: No nausea, no vomiting or diarrhea. No abdominal pain. Has melena GENITOURINARY: No dysuria or hematuria.  ENDOCRINE: No polyuria or nocturia. No heat or cold intolerance.  HEMATOLOGY: No anemia. No bruising. No bleeding.  INTEGUMENTARY: Has decubitus ulcer Has non healing ulcer on the leg.  MUSCULOSKELETAL: One leg amputated Gangrene lower extremity NEUROLOGIC: No numbness, tingling, or ataxia. No seizure-type activity.  PSYCHIATRIC: No anxiety. No insomnia. No ADD.   DRUG ALLERGIES:  No Known Allergies  VITALS:  Blood pressure 118/66, pulse 97, temperature 98.9 F (37.2 C), temperature source Oral, resp. rate 20, height 5\' 10"  (1.778 m), weight 83 kg, SpO2 96 %.  PHYSICAL EXAMINATION:   Physical Exam  GENERAL:  78 y.o.-year-old patient lying in the bed with no acute distress.  EYES: Pupils equal, round, reactive to light and accommodation. No scleral icterus. Extraocular muscles intact.  HEENT: Head atraumatic, normocephalic. Oropharynx and nasopharynx clear.  NECK:  Supple, no jugular venous distention. No thyroid  enlargement, no tenderness.  LUNGS: Normal breath sounds bilaterally, no wheezing, rales, rhonchi. No use of accessory muscles of respiration.  CARDIOVASCULAR: S1, S2 normal. No murmurs, rubs, or gallops.  ABDOMEN: Soft, nontender, nondistended. Bowel sounds present. No organomegaly or mass.  EXTREMITIES: No cyanosis, clubbing  Prior amputation right leg NEUROLOGIC: Cranial nerves II through XII are intact. No focal Motor or sensory deficits b/l.   PSYCHIATRIC: The patient is alert and oriented x 3.  SKIN: Decubitus ulcer Ulcer left leg, left foot               LABORATORY PANEL:   CBC Recent Labs  Lab 08/25/18 0426  WBC 17.6*  HGB 13.0  HCT 42.0  PLT 202   ------------------------------------------------------------------------------------------------------------------ Chemistries  Recent Labs  Lab 08/20/18 1314  08/24/18 0432 08/25/18 0426  NA 143   < > 143 150*  K 4.7   < > 3.7 3.5  CL 103   < > 118* 123*  CO2 24   < > 21* 21*  GLUCOSE 492*   < > 412* 209*  BUN 75*   < > 23 24*  CREATININE 1.50*   < > 1.18 1.43*  CALCIUM 9.9   < > 7.7* 8.0*  MG  --   --  2.3  --   AST 52*  --   --   --   ALT 52*  --   --   --   ALKPHOS 88  --   --   --   BILITOT 1.7*  --   --   --    < > = values in this interval  not displayed.   ------------------------------------------------------------------------------------------------------------------  Cardiac Enzymes No results for input(s): TROPONINI in the last 168 hours. ------------------------------------------------------------------------------------------------------------------  RADIOLOGY:  No results found.   ASSESSMENT AND PLAN:  78 year old male patient with history of diabetes mellitus type 2, coronary disease, non-STEMI,, diabetes mellitus type 2, GERD, hyperlipidemia, hypertension, peripheral vascular disease, rhabdomyolysis currently under hospitalist service  -Sepsis secondary to left leg infection and  Gangrene  needs amputation Vascular surgery follow up again Continue IV vancomycin and Zosyn antibiotics Infectious disease consult appreciated Aerobic cultures sent from the left foot  -MRSA and Morganella bacteremia Continue IV vancomycin antibiotic and Zosyn intravenously Further antibiotic recommendation as per infectious disease specialist  -Acute frontal CVA Oral aspirin Status post neurology evaluation Currently on neurochecks Carotid ultrasound showed bilateral minimal atherosclerotic disease Echocardiogram reviewed Supportive care Physical therapy and speech therapy as feasible  -Elevated troponin Abnormal rhythms on telemetry monitor Could be from demand ischemia from sepsis In view of multiple medical problems and poor prognosis aggressive intervention has been deferred by cardiology Continue beta-blocker Statin cannot be given secondary to rhabdomyolysis Cardiology consult appreciated Follow-up with cardiology who did not recommend any acute intervention 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% Cardiac monitoring to continue  -Acute rhabdomyolysis improved CK levels improved Continue to hold statin medication  -Acute hypernatremia  IV fluids switched to D5 1/2 normal saline Monitor electrolytes  -Uncontrolled diabetes mellitus Increased lantus insulin to 15 units [place] Continue sliding scale insulin coverage  -GI bleed Hemoglobin hematocrit appears to be stable Questionable etiology Drop in hemoglobin could be secondary to hemodilution Continue IV proton pump inhibitor Gastroenterology evaluation appreciated, no intervention recommended Serial hemoglobin hematocrit monitoring  -Acute renal insufficiency Monitor renal function with IV fluids Avoid nephrotoxic medication  -Decubitus ulcer infected Wound care consult appreciated Surgery consult done, no debridement recommended Broad-spectrum  antibiotics  -Palliative care consult To address goals of care  -Long-term prognosis poor No family member available to reach Nobody to make decisions No emergency contacts  -DNR by CODE STATUS  All the records are reviewed and case discussed with Care Management/Social Worker. Management plans discussed with the patient, family and they are in agreement.  CODE STATUS: DNR  DVT Prophylaxis: SCDs  TOTAL TIME TAKING CARE OF THIS PATIENT: 38 minutes.   POSSIBLE D/C IN 3 to 4 DAYS, DEPENDING ON CLINICAL CONDITION.  Ihor AustinPavan Chrishana Spargur M.D on 08/25/2018 at 10:52 AM  Between 7am to 6pm - Pager - [phone]  After 6pm go to [email or web address] - password [email or web address]  [facility]  Office  [phone]  CC: Primary care physician; Patient, No Pcp Per  Note: This dictation was prepared with Dragon dictation along with smaller phrase technology. Any transcriptional errors that result from this process are unintentional.

## 2018-08-25 NOTE — Plan of Care (Signed)
  Problem: Education: Goal: Knowledge of General Education information will improve Description Including pain rating scale, medication(s)/side effects and non-pharmacologic comfort measures Outcome: Progressing   

## 2018-08-25 NOTE — Progress Notes (Signed)
OT Cancellation Note  Patient Details Name: Spencer Edwards MRN: [id number] DOB: 03-23-1940   Cancelled Treatment:    Reason Eval/Treat Not Completed: Medical issues which prohibited therapy. Pt still pending input from vascular in regards to possible L BKA once pt is appropriate. Will hold OT evaluation at this time and re-attempt pending updated plan of care has been established. No PT consult of note, MD notified.  Jeni Salles, MPH, MS, OTR/L ascom [phone] 08/25/18, 2:08 PM

## 2018-08-25 NOTE — Progress Notes (Signed)
[facility] Daily Progress Note   Subjective: Patient more alert this a.m. when compared to yesterday.  Patient asked to see he has leg - which I showed him.  I also discussed with him why he would have to undergo an above-the-knee amputation versus a below the knee amputation. At the time, the patient did express his understanding and gave consent.  Unfortunately, the patient's mental status is not stable throughout the day.  Objective: Vitals:   08/24/18 0825 08/24/18 1559 08/25/18 0611 08/25/18 0927  BP: 114/61 123/62 118/72 118/66  Pulse: 93 (!) 104 97 97  Resp: (!) 24 17  20   Temp: 97.8 F (36.6 C) 97.8 F (36.6 C) 99 F (37.2 C) 98.9 F (37.2 C)  TempSrc: Oral Oral Oral Oral  SpO2: 93% 95% 97% 96%  Weight:      Height:        Intake/Output Summary (Last 24 hours) at 08/25/2018 1451 Last data filed at 08/24/2018 1800 Gross per 24 hour  Intake 1047.36 ml  Output -  Net 1047.36 ml   Physical Exam: A&Ox1, NAD CV: RRR Pulmonary: CTA Bilaterally Abdomen: Soft, Nontender, Nondistended Vascular:  Left Lower Extremity: Thigh soft. Calf soft.Nonpalpable pedal pulses. Foot is cold. Toes 1-3 without gangrene.Ulceration on the dorsal aspect of foot.Heel with developing eschar.Skin tears in the beginning of ulceration tracking up calf.             Right Lower Extremity: BKA stump healthy and intact.    Laboratory: CBC    Component Value Date/Time   WBC 17.6 (H) 08/25/2018 0426   HGB 13.0 08/25/2018 0426   HGB 12.7 (L) 01/10/2014 0456   HCT 42.0 08/25/2018 0426   HCT 39.1 (L) 01/10/2014 0456   PLT 202 08/25/2018 0426   PLT 232 01/10/2014 0456   BMET    Component Value Date/Time   NA 150 (H) 08/25/2018 0426   NA 145 01/14/2014 0521   K 3.5 08/25/2018 0426   K 3.8 01/14/2014 0521   CL 123 (H) 08/25/2018 0426   CL 107 01/14/2014 0521   CO2 21 (L) 08/25/2018 0426   CO2 31 01/14/2014 0521   GLUCOSE 209 (H) 08/25/2018 0426   GLUCOSE 67  01/14/2014 0521   BUN 24 (H) 08/25/2018 0426   BUN 29 (H) 01/14/2014 0521   CREATININE 1.43 (H) 08/25/2018 0426   CREATININE 1.36 (H) 01/14/2014 0521   CALCIUM 8.0 (L) 08/25/2018 0426   CALCIUM 8.2 (L) 01/14/2014 0521   GFRNONAA 46 (L) 08/25/2018 0426   GFRNONAA 54 (L) 01/14/2014 0521   GFRNONAA >60 07/15/2013 0809   GFRAA 54 (L) 08/25/2018 0426   GFRAA >60 01/14/2014 0521   GFRAA >60 07/15/2013 0809   Assessment/Planning: The patient is a 78 year old male with multiple medical issues including gangrenous wounds to the left lower extremity 1) patient more alert this a.m. when compared to yesterday unfortunately his mental status wavers throughout the day and is not stable.  Even though he is asked to see his leg this morning and I showed him, and we discussed the option of moving forward with above-the-knee amputation and he consented it is unsure if the patient fully understands what he is consenting to.  Will consult psychiatry for their input 2) if patient deemed appropriate to consent for himself or if DSS becomes decision-maker we will plan to move forward on Friday with a left AKA with Dr. Eber Hong Chelci [place] PA-C 08/25/2018 2:51 PM

## 2018-08-26 LAB — GLUCOSE, CAPILLARY
Glucose-Capillary: 297 mg/dL — ABNORMAL HIGH (ref 70–99)
Glucose-Capillary: 308 mg/dL — ABNORMAL HIGH (ref 70–99)
Glucose-Capillary: 388 mg/dL — ABNORMAL HIGH (ref 70–99)
Glucose-Capillary: 306 mg/dL — ABNORMAL HIGH (ref 70–99)

## 2018-08-26 LAB — BASIC METABOLIC PANEL
Anion gap: 3 — ABNORMAL LOW (ref 5–15)
BUN: 24 mg/dL — ABNORMAL HIGH (ref 8–23)
CO2: 22 mmol/L (ref 22–32)
Calcium: 7.8 mg/dL — ABNORMAL LOW (ref 8.9–10.3)
Chloride: 118 mmol/L — ABNORMAL HIGH (ref 98–111)
Creatinine, Ser: 1.35 mg/dL — ABNORMAL HIGH (ref 0.61–1.24)
GFR calc Af Amer: 57 mL/min — ABNORMAL LOW (ref >60)
GFR calc non Af Amer: 50 mL/min — ABNORMAL LOW (ref >60)
Glucose, Bld: 324 mg/dL — ABNORMAL HIGH (ref 70–99)
Potassium: 3.5 mmol/L (ref 3.5–5.1)
Sodium: 143 mmol/L (ref 135–145)

## 2018-08-26 LAB — CBC
HCT: 39.1 % (ref 39.0–52.0)
Hemoglobin: 12 g/dL — ABNORMAL LOW (ref 13.0–17.0)
MCH: 27 pg (ref 26.0–34.0)
MCHC: 30.7 g/dL (ref 30.0–36.0)
MCV: 87.9 fL (ref 80.0–100.0)
Platelets: 197 10*3/uL (ref 150–400)
RBC: 4.45 MIL/uL (ref 4.22–5.81)
RDW: 14.6 % (ref 11.5–15.5)
WBC: 15 10*3/uL — ABNORMAL HIGH (ref 4.0–10.5)
nRBC: 0 % (ref 0.0–0.2)

## 2018-08-26 MED ORDER — INSULIN GLARGINE 100 UNIT/ML ~~LOC~~ SOLN
18.0000 [IU] | Freq: Every day | SUBCUTANEOUS | Status: DC
  Administered 2018-08-27: 18 [IU] via SUBCUTANEOUS
  Filled 2018-08-26: qty 0.18

## 2018-08-26 MED ORDER — INSULIN ASPART 100 UNIT/ML ~~LOC~~ SOLN
4.0000 [IU] | Freq: Three times a day (TID) | SUBCUTANEOUS | Status: DC
  Administered 2018-08-26 – 2018-08-27 (×3): 4 [IU] via SUBCUTANEOUS
  Filled 2018-08-26 (×3): qty 1

## 2018-08-26 NOTE — Progress Notes (Signed)
Pt had 3 urinations with pink/red tinge. Bowel movement(s) shows no sign of blood.

## 2018-08-26 NOTE — Progress Notes (Signed)
  Speech Language Pathology Treatment: Dysphagia  Patient Details Name: Spencer Edwards MRN: [id number] DOB: 1940/05/23 Today's Date: 08/26/2018 Time: 2263-3354 SLP Time Calculation (min) (ACUTE ONLY): 45 min  Assessment / Plan / Recommendation Clinical Impression  Pt seen today for ongoing assessment of toleration of recommended dysphagia diet - Dysphagia 1 w/ Nectar liquids, d/t concern for aspiration secondary to oropharyngeal phase dysphagia. Pt also assessed for toleration and safety of Ice Chips as he desires "water" to drink. Pt is pending Surgery, AKA, per chart notes. A formal swallowing assessment, MBSS, would be recommended POST any Surgery to determine pt's swallow function and safety for diet consistency upgrade. Pt endorsed today he "had such a swallow test done in the past" - noted h/o dysphagia per PMH.      HPI HPI: Pt is a 78 y.o. 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  Patient reportedly fell out of bed 4 days ago and crawled to the kitchen and tried to call 911 but was unable to stand.  He denies any chest pain.  Feels weak and is complaining of thirst.  EMS took pictures of his house which was an incredible state of disrepair.  APS report was filed.  Patient states he is not taking any of his medications for 4 days. Pt denies any swallowing problems but states he "hiccups", maybe belches.  He was eating regular food w/ [employer], and drinking thin liquids at home.  CXR at admission: No active disease.  Of note, NSG reported pt has been lethargic and "not really eating much" since admission.       SLP Plan  Continue with current plan of care       Recommendations  Diet recommendations:  Dysphagia 1 (puree);Nectar-thick liquid Liquids provided via: Cup;No straw Medication Administration: Whole meds with puree(for safer swallowing) Supervision: Patient able to self feed;Staff to assist with self feeding;Intermittent supervision to cue for compensatory strategies Compensations: Minimize environmental distractions;Slow rate;Small sips/bites;Lingual sweep for clearance of pocketing;Multiple dry swallows after each bite/sip;Follow solids with liquid Postural Changes and/or Swallow Maneuvers: Seated upright 90 degrees;Upright 30-60 min after meal                General recommendations: (Dietician f/u; Palliative care consult for [place]) Oral Care Recommendations: Oral care BID;Oral care before and after PO;Staff/trained caregiver to provide oral care;Oral care prior to ice chip/H20 Follow up Recommendations: Skilled Nursing facility SLP Visit Diagnosis: Dysphagia, oropharyngeal phase (R13.12)(missing dentition) Plan: Continue with current plan of care       [place], [place], CCC-SLP Ifeoma Vallin 08/26/2018, 3:42 PM

## 2018-08-26 NOTE — Progress Notes (Signed)
[facility] [place] NAME: Spencer Edwards    MR#:  [id number]  DATE OF BIRTH:  1940/09/27  SUBJECTIVE:   No acute events overnight, seen by psychiatry yesterday and they deemed him competent to make his decisions.  Patient has agreed to have left-sided AKA which is planned for Friday.  No other acute events or complaints presently.  Pt. Asking if he could drink water as he does not like the thickened liquids  REVIEW OF SYSTEMS:    Review of Systems  Constitutional: Negative for chills and fever.  HENT: Negative for congestion and tinnitus.   Eyes: Negative for blurred vision and double vision.  Respiratory: Negative for cough, shortness of breath and wheezing.   Cardiovascular: Negative for chest pain, orthopnea and PND.  Gastrointestinal: Negative for abdominal pain, diarrhea, nausea and vomiting.  Genitourinary: Negative for dysuria and hematuria.  Neurological: Positive for weakness (Generalized weakness. ). Negative for dizziness, sensory change and focal weakness.  All other systems reviewed and are negative.   Nutrition: Dysphagia 1 with nectar thick liquids Tolerating Diet: Yes Tolerating PT: Await Eval.   DRUG ALLERGIES:  No Known Allergies  VITALS:  Blood pressure (!) 95/53, pulse 83, temperature 98 F (36.7 C), resp. rate 20, height 5\' 10"  (1.778 m), weight 83 kg, SpO2 100 %.  PHYSICAL EXAMINATION:   Physical Exam  GENERAL:  78 y.o.-year-old thin cachectic patient lying in bed in no acute distress.  EYES: Pupils equal, round, reactive to light and accommodation. No scleral icterus. Extraocular muscles intact.  HEENT: Head atraumatic, normocephalic. Oropharynx and nasopharynx clear.  NECK:  Supple, no jugular venous distention. No thyroid enlargement, no tenderness.  LUNGS: Normal breath sounds bilaterally, no wheezing, rales, rhonchi. No use of accessory muscles of respiration.  CARDIOVASCULAR: S1, S2 normal. No  murmurs, rubs, or gallops.  ABDOMEN: Soft, nontender, nondistended. Bowel sounds present. No organomegaly or mass.  EXTREMITIES: No cyanosis, clubbing or edema b/l.   Right BKA.  Left lower ext. Wounds/gangrene as shown below.  NEUROLOGIC: Cranial nerves II through XII are intact. No focal Motor or sensory deficits b/l. Globally weake  PSYCHIATRIC: The patient is alert and oriented x 3.  SKIN: No obvious rash, lesion, Left lower ext. Gangrene as shown below.             LABORATORY PANEL:   CBC Recent Labs  Lab 08/26/18 0631  WBC 15.0*  HGB 12.0*  HCT 39.1  PLT 197   ------------------------------------------------------------------------------------------------------------------  Chemistries  Recent Labs  Lab 08/20/18 1314  08/24/18 0432  08/26/18 0631  NA 143   < > 143   < > 143  K 4.7   < > 3.7   < > 3.5  CL 103   < > 118*   < > 118*  CO2 24   < > 21*   < > 22  GLUCOSE 492*   < > 412*   < > 324*  BUN 75*   < > 23   < > 24*  CREATININE 1.50*   < > 1.18   < > 1.35*  CALCIUM 9.9   < > 7.7*   < > 7.8*  MG  --   --  2.3  --   --   AST 52*  --   --   --   --   ALT 52*  --   --   --   --   ALKPHOS 88  --   --   --   --  BILITOT 1.7*  --   --   --   --    < > = values in this interval not displayed.   ------------------------------------------------------------------------------------------------------------------  Cardiac Enzymes No results for input(s): TROPONINI in the last 168 hours. ------------------------------------------------------------------------------------------------------------------  RADIOLOGY:  No results found.   ASSESSMENT AND PLAN:   78 year old male patient with history of diabetes mellitus type 2, coronary disease, non-STEMI,, diabetes mellitus type 2, GERD, hyperlipidemia, hypertension, peripheral vascular disease, rhabdomyolysis currently under hospitalist service  * Sepsis secondary to left leg infection and Gangrene Continue IV  vancomycin and Zosyn antibiotics -Seen by infectious disease and also vascular surgery.  Patient is competent to make decisions and plan for left AKA for Friday.  * MRSA and Morganella bacteremia Continue IV vancomycin antibiotic and Zosyn intravenously Further antibiotic recommendation as per infectious disease specialist  * Acute frontal CVA - cont. ASA Status post neurology evaluation Currently on neurochecks Carotid ultrasound showed bilateral minimal atherosclerotic disease Echocardiogram showing EF of 35 to 40% with no evidence of thrombus. - cont. PT as tolerated.   * Elevated troponin -Suspected to be supply demand ischemia from sepsis.  Troponins were mildly elevated and seen by cardiology and echo showing reduced systolic function of EF of 35 to 40%.  No acute cardiac intervention presently. -Continue medical management.  * Acute rhabdomyolysis improved and resolved with IV fluids.  Continue to hold statin medication  * Acute hypernatremia - resolved with D5W and will cont. To monitor.   * Uncontrolled diabetes mellitus - BS a bit high and will d/c Dextrose fluids.  - appreciate diabetes Coordinator input.  Cont. Lantus, Novolog with meals, SSI for now.   * GI bleed Hemoglobin hematocrit appears to be stable Questionable etiology Drop in hemoglobin could be secondary to hemodilution - cont. IV protonix.  Gastroenterology evaluation appreciated, no intervention recommended  * Acute renal insufficiency - resolved w/ some fluids and will cont. To monitor.  - renal dose meds and avoid nephrotoxins.   * Decubitus ulcer infected Wound care consult appreciated Surgery consult done, no debridement recommended - cont. Broad spectrum abx.   * BPH - cont. Flomax.    All the records are reviewed and case discussed with Care Management/Social Worker. Management plans discussed with the patient, family and they are in agreement.  CODE STATUS: DNR  TOTAL TIME  TAKING CARE OF THIS PATIENT: 30 minutes.   POSSIBLE D/C IN 2-3 DAYS, DEPENDING ON CLINICAL CONDITION.   Houston SirenVivek J Wilberth Damon M.D on 08/26/2018 at 3:35 PM  Between 7am to 6pm - Pager - [phone]  After 6pm go to [email or web address] - Social research officer, governmentpassword [email or web address] [email or web address]  [facility]  Office  [phone]  CC: Primary care physician; Patient, No Pcp Per

## 2018-08-26 NOTE — Progress Notes (Signed)
Inpatient Diabetes Program Recommendations  AACE/ADA: New Consensus Statement on Inpatient Glycemic Control   Target Ranges:  Prepandial:   less than 140 mg/dL      Peak postprandial:   less than 180 mg/dL (1-2 hours)      Critically ill patients:  140 - 180 mg/dL   Results for Spencer Edwards, Spencer Edwards (MRN [id number]) as of 08/26/2018 08:48  Ref. Range 08/25/2018 07:41 08/25/2018 11:42 08/25/2018 16:40 08/25/2018 21:00 08/26/2018 08:08  Glucose-Capillary Latest Ref Range: 70 - 99 mg/dL 209 (H) 241 (H) 272 (H) 303 (H) 297 (H)   Review of Glycemic Control  Diabetes history: DM2  Outpatient Diabetes medications:Lantus 24 units QHS Current orders for Inpatient glycemic control:Lantus 15 units daily, Novolog 0-9 units TID with meals, Novolog 0-5 units QHS  Inpatient Diabetes Program Recommendations:  Insulin - Basal: Please considerincreasing Lantus to18units daily.   Insulin-Meal Coverage: Please consider ordering Novolog 4 units TID with meals for meal coverage if patient eats at least 50% of meals.  Thanks, Barnie Alderman, RN, MSN, CDE Diabetes Coordinator Inpatient Diabetes Program [phone] (Team Pager from 8am to 5pm)

## 2018-08-26 NOTE — Plan of Care (Signed)
  Problem: Education: Goal: Knowledge of General Education information will improve Description Including pain rating scale, medication(s)/side effects and non-pharmacologic comfort measures Outcome: Progressing   

## 2018-08-26 NOTE — Progress Notes (Signed)
Palliative Medicine [facility]  [phone] [phone]   Name: Spencer BrunnerRichard T Edwards Date: 08/26/2018 MRN: [id number]  DOB: 02/13/1940  Patient Care Team: Patient, No Pcp Per as PCP - General (General Practice)    REASON FOR CONSULTATION: Palliative Care consult requested for this 78 y.o. male with multiple medical problems including diabetes, CAD, and PVD status post right BKA.  Patient was admitted to the hospital on 08/20/2018 with sepsis from lower extremity infection.  Patient apparently was found at home on the floor after lying there for 4 days.  Patient is currently being medically managed for sepsis but there is concerned that he may need future amputation.  Palliative care was consulted to help address goals.  CODE STATUS: DNR  PAST MEDICAL HISTORY: Past Medical History:  Diagnosis Date   Acute kidney failure (HCC)    Anxiety disorder    Coronary artery disease    Non-ST elevation myocardial infarction in November 2015 in the setting of septic shock and multiorgan failure. Peak troponin was 25. Echo showed normal LV systolic function with inferior and posterior hypokinesis.   Diabetes mellitus without complication (HCC)    Dysphagia    Gastro-esophageal reflux    Hematuria    Hyperlipidemia    Hypernatremia    Hypertension    MI (myocardial infarction) (HCC)    Muscle weakness    PVD (peripheral vascular disease) (HCC)    Rhabdomyolysis    Urinary retention     PAST SURGICAL HISTORY:  Past Surgical History:  Procedure Laterality Date   ABDOMINAL SURGERY     leg amputation      HEMATOLOGY/ONCOLOGY HISTORY:  Oncology History   No history exists.    ALLERGIES:  has No Known Allergies.  MEDICATIONS:  Current Facility-Administered Medications  Medication Dose Route Frequency Provider Last Rate Last Dose   0.9 %  sodium chloride infusion   Intravenous PRN Houston SirenSainani, Vivek J, MD   Stopped at 08/26/18  0136   acetaminophen (TYLENOL) tablet 650 mg  650 mg Oral Q6H PRN Alford HighlandWieting, Skylur, MD   650 mg at 08/23/18 2111   Or   acetaminophen (TYLENOL) suppository 650 mg  650 mg Rectal Q6H PRN Alford HighlandWieting, Lynton, MD       ALPRAZolam Prudy Feeler(XANAX) tablet 0.25 mg  0.25 mg Oral TID Alford HighlandWieting, Marcelles, MD   0.25 mg at 08/26/18 [id number]   aspirin tablet 325 mg  325 mg Oral Daily Pyreddy, Vivien RotaPavan, MD   325 mg at 08/26/18 1312   bisacodyl (DULCOLAX) suppository 10 mg  10 mg Rectal PRN Alford HighlandWieting, Essie, MD   10 mg at 08/23/18 0859   dextrose 5 %-0.45 % sodium chloride infusion   Intravenous Continuous Pyreddy, Vivien RotaPavan, MD 75 mL/hr at 08/26/18 1131     food thickener (THICK IT) powder   Oral PRN Pyreddy, Vivien RotaPavan, MD       guaiFENesin-dextromethorphan (ROBITUSSIN DM) 100-10 MG/5ML syrup 5 mL  5 mL Oral Q4H PRN Pyreddy, Vivien RotaPavan, MD   5 mL at 08/23/18 0121   insulin aspart (novoLOG) injection 0-5 Units  0-5 Units Subcutaneous QHS Alford HighlandWieting, Travin, MD   4 Units at 08/25/18 2132   insulin aspart (novoLOG) injection 0-9 Units  0-9 Units Subcutaneous TID WC Alford HighlandWieting, Omauri, MD   7 Units at 08/26/18 1227   insulin glargine (LANTUS) injection 15 Units  15 Units Subcutaneous Daily Ihor AustinPyreddy, Pavan, MD   15 Units at 08/26/18 1312   ipratropium-albuterol (DUONEB) 0.5-2.5 (3) MG/3ML nebulizer solution 3  mL  3 mL Nebulization Q4H PRN Alford HighlandWieting, Perl, MD       ondansetron [facility](ZOFRAN) tablet 4 mg  4 mg Oral Q6H PRN Alford HighlandWieting, Murphy, MD       Or   ondansetron [facility](ZOFRAN) injection 4 mg  4 mg Intravenous Q6H PRN Wieting, Olon, MD       oxyCODONE (Oxy IR/ROXICODONE) immediate release tablet 5 mg  5 mg Oral Q4H PRN Alford HighlandWieting, Lando, MD       pantoprazole (PROTONIX) injection 40 mg  40 mg Intravenous Q12H Pyreddy, Vivien RotaPavan, MD   40 mg at 08/26/18 0844   piperacillin-tazobactam (ZOSYN) IVPB 3.375 g  3.375 g Intravenous Q8H Bertram SavinSimpson, Michael L, RPH 12.5 mL/hr at 08/26/18 1311 3.375 g at 08/26/18 1311   tamsulosin (FLOMAX) capsule 0.4 mg  0.4 mg  Oral Daily Alford HighlandWieting, Olando, MD   0.4 mg at 08/26/18 0844    VITAL SIGNS: BP (!) 110/59 (BP Location: Left Arm)    Pulse 82    Temp 97.7 F (36.5 C) (Oral)    Resp (!) 22    Ht 5\' 10"  (1.778 m)    Wt 182 lb 15.7 oz (83 kg)    SpO2 100%    BMI 26.26 kg/m  Filed Weights   08/20/18 1326  Weight: 182 lb 15.7 oz (83 kg)    Estimated body mass index is 26.26 kg/m as calculated from the following:   Height as of this encounter: 5\' 10"  (1.778 m).   Weight as of this encounter: 182 lb 15.7 oz (83 kg).  LABS: CBC:    Component Value Date/Time   WBC 15.0 (H) 08/26/2018 0631   HGB 12.0 (L) 08/26/2018 0631   HGB 12.7 (L) 01/10/2014 0456   HCT 39.1 08/26/2018 0631   HCT 39.1 (L) 01/10/2014 0456   PLT 197 08/26/2018 0631   PLT 232 01/10/2014 0456   MCV 87.9 08/26/2018 0631   MCV 87 01/10/2014 0456   NEUTROABS 12.6 (H) 08/20/2018 1314   NEUTROABS 14.8 (H) 01/10/2014 0456   LYMPHSABS 0.8 08/20/2018 1314   LYMPHSABS 0.9 (L) 01/10/2014 0456   MONOABS 1.3 (H) 08/20/2018 1314   MONOABS 1.7 (H) 01/10/2014 0456   EOSABS 0.0 08/20/2018 1314   EOSABS 0.2 01/10/2014 0456   BASOSABS 0.1 08/20/2018 1314   BASOSABS 0.1 01/10/2014 0456   Comprehensive Metabolic Panel:    Component Value Date/Time   NA 143 08/26/2018 0631   NA 145 01/14/2014 0521   K 3.5 08/26/2018 0631   K 3.8 01/14/2014 0521   CL 118 (H) 08/26/2018 0631   CL 107 01/14/2014 0521   CO2 22 08/26/2018 0631   CO2 31 01/14/2014 0521   BUN 24 (H) 08/26/2018 0631   BUN 29 (H) 01/14/2014 0521   CREATININE 1.35 (H) 08/26/2018 0631   CREATININE 1.36 (H) 01/14/2014 0521   GLUCOSE 324 (H) 08/26/2018 0631   GLUCOSE 67 01/14/2014 0521   CALCIUM 7.8 (L) 08/26/2018 0631   CALCIUM 8.2 (L) 01/14/2014 0521   AST 52 (H) 08/20/2018 1314   AST 91 (H) 01/02/2014 0510   ALT 52 (H) 08/20/2018 1314   ALT 64 (H) 01/02/2014 0510   ALKPHOS 88 08/20/2018 1314   ALKPHOS 70 01/02/2014 0510   BILITOT 1.7 (H) 08/20/2018 1314   BILITOT 1.7 (H)  01/02/2014 0510   PROT 7.6 08/20/2018 1314   PROT 5.1 (L) 01/02/2014 0510   ALBUMIN 3.2 (L) 08/20/2018 1314   ALBUMIN 1.8 (L) 01/04/2014 0315    RADIOGRAPHIC STUDIES:  Dg Abd 1 View  Result Date: 08/22/2018 CLINICAL DATA:  Metal screening prior to MRI. EXAM: ABDOMEN - 1 VIEW COMPARISON:  January 01, 2014 FINDINGS: Numerous surgical clips are seen throughout the abdomen. An anastomotic ring is seen in the right lower quadrant. No other metal identified in the abdomen or pelvis. No other acute abnormalities. IMPRESSION: Surgical clips and an anastomotic ring in the abdomen. No other metal seen in the abdomen or pelvis. Electronically Signed   By: Gerome Samavid  Williams III M.D   On: 08/22/2018 13:28   Ct Head Wo Contrast  Result Date: 08/20/2018 CLINICAL DATA:  Status post fall, found down. Multiple soft tissue wounds in pressure sores. EXAM: CT HEAD WITHOUT CONTRAST TECHNIQUE: Contiguous axial images were obtained from the base of the skull through the vertex without intravenous contrast. COMPARISON:  Head CT dated 12/30/2013. FINDINGS: Brain: Generalized age related parenchymal volume loss with commensurate dilatation of the ventricles and sulci. No mass, hemorrhage, edema or other evidence of acute parenchymal abnormality. No extra-axial hemorrhage. Vascular: No hyperdense vessel or unexpected calcification. Skull: Normal. Negative for fracture or focal lesion. Sinuses/Orbits: No acute finding. Other: None. IMPRESSION: No acute findings in the brain. No evidence of intracranial mass, hemorrhage or edema. Electronically Signed   By: Bary RichardStan  Maynard M.D.   On: 08/20/2018 14:06   Mr Brain Wo Contrast  Result Date: 08/22/2018 CLINICAL DATA:  Seizure, new.  Fall from bed 4 days ago. EXAM: MRI HEAD WITHOUT CONTRAST TECHNIQUE: Multiplanar, multiecho pulse sequences of the brain and surrounding structures were obtained without intravenous contrast. COMPARISON:  CT head without contrast 08/20/2018 FINDINGS: Brain: A  4 mm acute cortical infarct is present in posterior left frontal lobe. T2 signal changes are associated with the area of acute infarction. No other acute infarct is present. Mild generalized atrophy is present. There is relatively minimal white matter disease. The ventricles are proportionate to the degree of atrophy. No significant extraaxial fluid collection is present. The internal auditory canals are within normal limits. The brainstem and cerebellum are within normal limits. Vascular: Flow is present in the major intracranial arteries. Skull and upper cervical spine: The craniocervical junction is normal. Upper cervical spine is within normal limits. Marrow signal is unremarkable. Sinuses/Orbits: The paranasal sinuses are clear. There is some fluid in the right mastoid air cells. No obstructing nasopharyngeal lesion is present. The globes and orbits are within normal limits. IMPRESSION: 1. 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 2. Otherwise normal MRI appearance of the brain for age. 3. Minimal right mastoid effusion. No obstructing nasopharyngeal lesion is present. This was not present on the prior CT. Electronically Signed   By: Marin Robertshristopher  Mattern M.D.   On: 08/22/2018 15:04   [place] Carotid Bilateral  Result Date: 08/23/2018 CLINICAL DATA:  CVA. History of hypertension, CAD, hyperlipidemia, diabetes and smoking. EXAM: BILATERAL CAROTID DUPLEX ULTRASOUND TECHNIQUE: Wallace CullensGray scale imaging, color Doppler and duplex ultrasound were performed of bilateral carotid and vertebral arteries in the neck. COMPARISON:  01/10/2011 FINDINGS: Criteria: Quantification of carotid stenosis is based on velocity parameters that correlate the residual internal carotid diameter with NASCET-based stenosis levels, using the diameter of the distal internal carotid lumen as the denominator for stenosis measurement. The following velocity measurements were obtained: RIGHT ICA: 48/11 cm/sec CCA: 64/9 cm/sec  SYSTOLIC ICA/CCA RATIO:  0.7 ECA: 98 cm/sec LEFT ICA: 89/23 cm/sec CCA: 56/9 cm/sec SYSTOLIC ICA/CCA RATIO:  1.6 ECA: 94 cm/sec RIGHT CAROTID ARTERY: There is a minimal amount  of eccentric echogenic plaque within the right carotid bulb (images 14 and 15), progressed compared to the 12/2010 examination though not resulting in elevated peak systolic velocities within the interrogated course the right internal carotid artery to suggest a hemodynamically significant stenosis. RIGHT VERTEBRAL ARTERY:  Antegrade Flow LEFT CAROTID ARTERY: There is a moderate amount of eccentric echogenic plaque within the left carotid bulb (images 48), extending through the interrogated course of the left internal carotid artery (image 55), progressed compared to the 12/2010 examination, though not resulting in elevated peak systolic velocities within the interrogated course the left internal carotid artery to suggest a hemodynamically significant stenosis. LEFT VERTEBRAL ARTERY:  Antegrade Flow IMPRESSION: Minimal to moderate amount of bilateral atherosclerotic plaque, left greater than right, progressed compared to the 12/2010 examination though not resulting in a hemodynamically significant stenosis within either internal carotid artery. Electronically Signed   By: Simonne Come M.D.   On: 08/23/2018 08:34   Dg Chest Port 1 View  Result Date: 08/20/2018 CLINICAL DATA:  Fall. EXAM: PORTABLE CHEST 1 VIEW COMPARISON:  Radiograph of January 12, 2014. FINDINGS: The heart size and mediastinal contours are within normal limits. Both lungs are clear. No pneumothorax or pleural effusion is noted. The visualized skeletal structures are unremarkable. IMPRESSION: No active disease. Electronically Signed   By: Lupita Raider M.D.   On: 08/20/2018 13:29   Dg Foot Complete Left  Result Date: 08/20/2018 CLINICAL DATA:  Open wound, concern for fracture/osteomyelitis EXAM: LEFT FOOT - COMPLETE 3+ VIEW COMPARISON:  None. FINDINGS: There is an open  wound of the tip of the left great toe with gross bony destruction of the exposed tip of the distal phalanx, consistent with osteomyelitis. There is a single interphalangeal joint of the second digit with dislocation of the distal phalanx. There may be additional bony erosion of the distal aspect of the proximal left fourth phalanx. There is diffuse soft tissue edema about the left foot with additional open wounds about the dorsum of the foot. IMPRESSION: There is an open wound of the tip of the left great toe with gross bony destruction of the exposed tip of the distal phalanx, consistent with osteomyelitis. There is a single interphalangeal joint of the second digit with dislocation of the distal phalanx. There may be additional bony erosion of the distal aspect of the proximal left fourth phalanx. There is diffuse soft tissue edema about the left foot with additional open wounds about the dorsum of the foot. Contrast enhanced MRI may be helpful to evaluate for extent of multifocal osteomyelitis. Electronically Signed   By: Lauralyn Primes M.D.   On: 08/20/2018 13:56    PERFORMANCE STATUS (ECOG) : 4 - Bedbound  Review of Systems Unless otherwise noted, a complete review of systems is negative.  Physical Exam General: NAD, frail appearing, thin Pulmonary: Unlabored Extremities: Right BKA, left leg with multiple wounds Skin: Wounds as noted above Neurological: Weakness but otherwise nonfocal  IMPRESSION: Follow-up visit today.  Patient's mentation is much improved today.  He is alert and oriented.  He recalls speaking with Dr. Cherlynn Kaiser earlier today.  Patient is able to tell me the treatment plan including amputation later this week.  Patient is also able to verbalize an understanding of the risks and benefits associated with amputation and wants to proceed.  He recognizes that his functioning may be impacted by the amputation and that he may be less independent going forward.  Patient says that he  spoke by phone with his  cousin, Spencer Edwards, this morning.  However, he does not have a phone number.  He mentioned another cousin (Judy's brother), Spencer Edwards, whom he thinks might be involved with his medical care following this hospitalization.  Patient gave me permission to try to locate his cousins and stated that Spencer Edwards was employed as a Public relations account executive in [place], [place]. Patient would like for Spencer Edwards and Spencer Edwards to be his emergency contacts.  I was able to locate Spencer Edwards at his high school and requested that he call me back.   I received a call from patient's cousin Spencer Edwards.  With patient's permission, I updated her on patient's current medical problems.  She has served as patient's healthcare proxy in the past when patient was not able to make his own medical decisions.  However, patient has no formal advance directives or healthcare power of attorney.  I did discuss establishing a healthcare power of attorney with patient today.  He says that he wants to think about it.  I have updated the chart to reflect emergency contacts.  Case discussed with Dr. Verdell Carmine.  PLAN: -Continue current scope of treatment -Recommend HCPOA   Time Total: 45 minutes  Visit consisted of counseling and education dealing with the complex and emotionally intense issues of symptom management and palliative care in the setting of serious and potentially life-threatening illness.Greater than 50%  of this time was spent counseling and coordinating care related to the above assessment and plan.  Signed by: Altha Harm, PhD, NP-C [phone] (Work Cell)

## 2018-08-26 NOTE — Care Management Important Message (Signed)
Important Message  Patient Details  Name: Spencer Edwards MRN: [id number] Date of Birth: November 04, 1940   Medicare Important Message Given:  Other (see comment)  Second attempt today to obtain [name] signature but stated he was scheduled for surgery tomorrow and did not feel able to sign form today. Pt. appeared drowsy and I let him know I could check back another day when he was feeling better and he said that would be appreciated.  Juliann Pulse A Max Romano 08/26/2018, 3:00 PM

## 2018-08-26 NOTE — Progress Notes (Signed)
OT Cancellation Note  Patient Details Name: Spencer Edwards MRN: [id number] DOB: [birth year]   Cancelled Treatment:    Reason Eval/Treat Not Completed: Other (comment). Per secure message with Dr. Lucky Cowboy this am, OT has clearance to go ahead with eval. Upon arrival to pt room on this date, pt with SLP finishing up session. Pt about to start eating lunch, and requesting OT return after he has time to eat. Will re-attempt at a later time/date as available and pt medically appropriate for OT eval.   Shara Blazing, M.S., OTR/L Ascom: [phone] 08/26/18, 12:57 PM

## 2018-08-26 NOTE — Progress Notes (Signed)
ID  Pt says he is very thirsty and would like to have water Very slow speech but completely oriented  Patient Vitals for the past 24 hrs:  BP Temp Temp src Pulse Resp SpO2  08/26/18 1526 (!) 95/53 98 F (36.7 C) - 83 20 100 %  08/26/18 0717 (!) 110/59 97.7 F (36.5 C) Oral 82 (!) 22 100 %  08/26/18 0540 107/64 98.2 F (36.8 C) Oral 82 17 97 %  08/26/18 0427 (!) 107/59 97.7 F (36.5 C) Oral 85 18 96 %  weak Chest b/l air entry Left leg wounds and necrotic toes     Back pressure wounds  CBC Latest Ref Rng & Units 08/26/2018 08/25/2018 08/24/2018  WBC 4.0 - 10.5 K/uL 15.0(H) 17.6(H) 16.5(H)  Hemoglobin 13.0 - 17.0 g/dL 12.0(L) 13.0 12.8(L)  Hematocrit 39.0 - 52.0 % 39.1 42.0 41.0  Platelets 150 - 400 K/uL 197 202 200     CMP Latest Ref Rng & Units 08/26/2018 08/25/2018 08/24/2018  Glucose 70 - 99 mg/dL 324(H) 209(H) 412(H)  BUN 8 - 23 mg/dL 24(H) 24(H) 23  Creatinine 0.61 - 1.24 mg/dL 1.35(H) 1.43(H) 1.18  Sodium 135 - 145 mmol/L 143 150(H) 143  Potassium 3.5 - 5.1 mmol/L 3.5 3.5 3.7  Chloride 98 - 111 mmol/L 118(H) 123(H) 118(H)  CO2 22 - 32 mmol/L 22 21(L) 21(L)  Calcium 8.9 - 10.3 mg/dL 7.8(L) 8.0(L) 7.7(L)  Total Protein 6.5 - 8.1 g/dL - - -  Total Bilirubin 0.3 - 1.2 mg/dL - - -  Alkaline Phos 38 - 126 U/L - - -  AST 15 - 41 U/L - - -  ALT 0 - 44 U/L - - -    Micro BC from 7/9 morganella  Impression/Recommendation Fall and lying on the floor for 5 days  With multiple wounds Left leg necrotic areas, with severe infection- on zosyn For AKA on friday  Morganella and coag neg staph ( NOT MRSA) bacteremia- the coag neg staph is a contaminant On zosyn  AKI  Discussed the management with the patient Discussed the management with the nurse

## 2018-08-26 NOTE — Care Management Important Message (Signed)
Important Message  Patient Details  Name: Spencer Edwards MRN: [id number] Date of Birth: 08-Jul-1940   Medicare Important Message Given:  Other (see comment)  Reviewed the Important Message from Medicare with Spencer Edwards and he stated he understood it.  Asked if would sign the form and he said a lot has been going on this morning and asked me to come back later. Gave him a copy of the form to review and will follow-up later.   Juliann Pulse A [name] 08/26/2018, 11:34 AM

## 2018-08-26 NOTE — Plan of Care (Signed)
  Problem: Education: Goal: Knowledge of General Education information will improve Description: Including pain rating scale, medication(s)/side effects and non-pharmacologic comfort measures Outcome: Progressing   Problem: Clinical Measurements: Goal: Ability to maintain clinical measurements within normal limits will improve Outcome: Progressing   

## 2018-08-27 LAB — GLUCOSE, CAPILLARY
Glucose-Capillary: 214 mg/dL — ABNORMAL HIGH (ref 70–99)
Glucose-Capillary: 209 mg/dL — ABNORMAL HIGH (ref 70–99)
Glucose-Capillary: 236 mg/dL — ABNORMAL HIGH (ref 70–99)
Glucose-Capillary: 273 mg/dL — ABNORMAL HIGH (ref 70–99)

## 2018-08-27 LAB — TSH: TSH: 6.222 u[IU]/mL — ABNORMAL HIGH (ref 0.350–4.500)

## 2018-08-27 LAB — ABO/RH: ABO/RH(D): O POS

## 2018-08-27 MED ORDER — SODIUM CHLORIDE 0.9 % IV SOLN
INTRAVENOUS | Status: DC
  Administered 2018-08-28 (×2): via INTRAVENOUS

## 2018-08-27 MED ORDER — INSULIN ASPART 100 UNIT/ML ~~LOC~~ SOLN
7.0000 [IU] | Freq: Three times a day (TID) | SUBCUTANEOUS | Status: DC
  Administered 2018-08-27 – 2018-08-31 (×10): 7 [IU] via SUBCUTANEOUS
  Filled 2018-08-27 (×7): qty 1

## 2018-08-27 MED ORDER — INSULIN GLARGINE 100 UNIT/ML ~~LOC~~ SOLN
20.0000 [IU] | Freq: Every day | SUBCUTANEOUS | Status: DC
  Administered 2018-08-29 – 2018-08-31 (×3): 20 [IU] via SUBCUTANEOUS
  Filled 2018-08-27 (×5): qty 0.2

## 2018-08-27 NOTE — Progress Notes (Signed)
[facility] [place] at [facility]     PATIENT NAME: Spencer Edwards    MR#:  [id number]  DATE OF BIRTH:  09/23/1940  SUBJECTIVE:   No acute events overnight. Plan for Left AKA tomorrow.    REVIEW OF SYSTEMS:    Review of Systems  Constitutional: Negative for chills and fever.  HENT: Negative for congestion and tinnitus.   Eyes: Negative for blurred vision and double vision.  Respiratory: Negative for cough, shortness of breath and wheezing.   Cardiovascular: Negative for chest pain, orthopnea and PND.  Gastrointestinal: Negative for abdominal pain, diarrhea, nausea and vomiting.  Genitourinary: Negative for dysuria and hematuria.  Neurological: Positive for weakness (Generalized weakness. ). Negative for dizziness, sensory change and focal weakness.  All other systems reviewed and are negative.   Nutrition: Dysphagia 1 with nectar thick liquids Tolerating Diet: Yes Tolerating PT: Await Eval.   DRUG ALLERGIES:  No Known Allergies  VITALS:  Blood pressure (!) 109/58, pulse 80, temperature 98.5 F (36.9 C), temperature source Oral, resp. rate 16, height 5\' 10"  (1.778 m), weight 83 kg, SpO2 97 %.  PHYSICAL EXAMINATION:   Physical Exam  GENERAL:  78 y.o.-year-old thin cachectic patient lying in bed in no acute distress.  EYES: Pupils equal, round, reactive to light and accommodation. No scleral icterus. Extraocular muscles intact.  HEENT: Head atraumatic, normocephalic. Oropharynx and nasopharynx clear.  NECK:  Supple, no jugular venous distention. No thyroid enlargement, no tenderness.  LUNGS: Normal breath sounds bilaterally, no wheezing, rales, rhonchi. No use of accessory muscles of respiration.  CARDIOVASCULAR: S1, S2 normal. No murmurs, rubs, or gallops.  ABDOMEN: Soft, nontender, nondistended. Bowel sounds present. No organomegaly or mass.  EXTREMITIES: No cyanosis, clubbing or edema b/l.   Right BKA.  Left lower ext. Wounds/gangrene as shown  below.  NEUROLOGIC: Cranial nerves II through XII are intact. No focal Motor or sensory deficits b/l. Globally weake  PSYCHIATRIC: The patient is alert and oriented x 3.  SKIN: No obvious rash, lesion, Left lower ext. Gangrene as shown below.             LABORATORY PANEL:   CBC Recent Labs  Lab 08/26/18 0631  WBC 15.0*  HGB 12.0*  HCT 39.1  PLT 197   ------------------------------------------------------------------------------------------------------------------  Chemistries  Recent Labs  Lab 08/24/18 0432  08/26/18 0631  NA 143   < > 143  K 3.7   < > 3.5  CL 118*   < > 118*  CO2 21*   < > 22  GLUCOSE 412*   < > 324*  BUN 23   < > 24*  CREATININE 1.18   < > 1.35*  CALCIUM 7.7*   < > 7.8*  MG 2.3  --   --    < > = values in this interval not displayed.   ------------------------------------------------------------------------------------------------------------------  Cardiac Enzymes No results for input(s): TROPONINI in the last 168 hours. ------------------------------------------------------------------------------------------------------------------  RADIOLOGY:  No results found.   ASSESSMENT AND PLAN:   78 year old male patient with history of diabetes mellitus type 2, coronary disease, non-STEMI,, diabetes mellitus type 2, GERD, hyperlipidemia, hypertension, peripheral vascular disease, rhabdomyolysis currently under hospitalist service  * Sepsis secondary to left leg infection and Gangrene Continue IV  Zosyn for now.   -Seen by infectious disease and also vascular surgery.  Patient is competent to make decisions and plan for left AKA tomorrow.  *  Morganella bacteremia with Coag neg staph  -Patient was on IV vancomycin  and Zosyn.  Coag negative staph is likely contaminant.  Vancomycin discontinued as per ID.  Continue Zosyn for now.  * Acute frontal CVA - cont. ASA Status post neurology evaluation Currently on neurochecks Carotid ultrasound  showed bilateral minimal atherosclerotic disease Echocardiogram showing EF of 35 to 40% with no evidence of thrombus. - cont. PT as tolerated.   * Elevated troponin -Suspected to be supply demand ischemia from sepsis.  Troponins were mildly elevated and seen by cardiology and echo showing reduced systolic function of EF of 35 to 40%.  No acute cardiac intervention presently. -Continue medical management.  * Acute rhabdomyolysis improved and resolved with IV fluids.  Continue to hold statin medication  * Acute hypernatremia - resolved with D5W and will cont. To monitor.   * Uncontrolled diabetes mellitus -Blood sugars somewhat labile but improving.  Appreciate diabetes coordinator input.  Will advance Lantus dose, increase NovoLog with meals.  Continue sliding scale insulin for now.  * GI bleed Hemoglobin hematocrit appears to be stable Questionable etiology Drop in hemoglobin could be secondary to hemodilution - cont. IV protonix.  Gastroenterology evaluation appreciated, no intervention recommended  * Acute renal insufficiency - resolved w/ some fluids and will cont. To monitor.  - renal dose meds and avoid nephrotoxins.   * Decubitus ulcer infected Wound care consult appreciated Surgery consult done, no debridement recommended - cont. abx as mentioned above.   * BPH - cont. Flomax.    All the records are reviewed and case discussed with Care Management/Social Worker. Management plans discussed with the patient, family and they are in agreement.  CODE STATUS: DNR  TOTAL TIME TAKING CARE OF THIS PATIENT: 25 minutes.   POSSIBLE D/C IN 2-3 DAYS, DEPENDING ON CLINICAL CONDITION.   Henreitta Leber M.D on 08/27/2018 at 1:39 PM  Between 7am to 6pm - Pager - [phone]  After 6pm go to [email or web address] - Proofreader  [facility] [place] [facility]  Office  [phone]  CC: Primary care physician; Patient, No Pcp Per

## 2018-08-27 NOTE — Progress Notes (Signed)
   08/27/18 2000  Clinical Encounter Type  Visited With Patient  Visit Type Initial;Spiritual support;Pre-op  Referral From Nurse  Spiritual Encounters  Spiritual Needs Prayer;Emotional;Grief support  Stress Factors  Patient Stress Factors Health changes;Other (Comment)

## 2018-08-27 NOTE — Progress Notes (Signed)
[facility] Daily Progress Note   Subjective: Patient sitting in bedside chair comfortably this AM.  Remarkably more alert and oriented when compared to my last visit.  Patient was fully able to discuss his planned left above-the-knee amputation tomorrow.  Again, we discussed the procedure, risks and benefits.  All of his questions were answered.  He continues to consent. He was also able to recall memories of living in [place] and going to [employer], etc.  Objective: Vitals:   08/26/18 0717 08/26/18 1526 08/27/18 0001 08/27/18 0747  BP: (!) 110/59 (!) 95/53 (!) 107/58 (!) 109/58  Pulse: 82 83 83 80  Resp: (!) 22 20 20 16   Temp: 97.7 F (36.5 C) 98 F (36.7 C) 98.4 F (36.9 C) 98.5 F (36.9 C)  TempSrc: Oral  Oral Oral  SpO2: 100% 100% 97% 97%  Weight:      Height:        Intake/Output Summary (Last 24 hours) at 08/27/2018 1221 Last data filed at 08/26/2018 1811 Gross per 24 hour  Intake 823.17 ml  Output -  Net 823.17 ml   Physical Exam: A&Ox3, NAD CV: RRR Pulmonary: CTA Bilaterally Abdomen: Soft, Nontender, Nondistended Vascular:             Left Lower Extremity:Thigh soft. Calf soft.Nonpalpable pedal pulses. Foot is cold. Toes 1-3 without gangrene.Ulceration on the dorsal aspect of foot.Heel with developing eschar.Skin tears in the beginning of ulceration tracking up calf. Right Lower Extremity: BKA stump healthy and intact.   Laboratory: CBC    Component Value Date/Time   WBC 15.0 (H) 08/26/2018 0631   HGB 12.0 (L) 08/26/2018 0631   HGB 12.7 (L) 01/10/2014 0456   HCT 39.1 08/26/2018 0631   HCT 39.1 (L) 01/10/2014 0456   PLT 197 08/26/2018 0631   PLT 232 01/10/2014 0456   BMET    Component Value Date/Time   NA 143 08/26/2018 0631   NA 145 01/14/2014 0521   K 3.5 08/26/2018 0631   K 3.8 01/14/2014 0521   CL 118 (H) 08/26/2018 0631   CL 107 01/14/2014 0521   CO2 22 08/26/2018 0631   CO2 31  01/14/2014 0521   GLUCOSE 324 (H) 08/26/2018 0631   GLUCOSE 67 01/14/2014 0521   BUN 24 (H) 08/26/2018 0631   BUN 29 (H) 01/14/2014 0521   CREATININE 1.35 (H) 08/26/2018 0631   CREATININE 1.36 (H) 01/14/2014 0521   CALCIUM 7.8 (L) 08/26/2018 0631   CALCIUM 8.2 (L) 01/14/2014 0521   GFRNONAA 50 (L) 08/26/2018 0631   GFRNONAA 54 (L) 01/14/2014 0521   GFRNONAA >60 07/15/2013 0809   GFRAA 57 (L) 08/26/2018 0631   GFRAA >60 01/14/2014 0521   GFRAA >60 07/15/2013 0809   Assessment/Planning: The patient is a 78 year old male with multiple medical issues including gangrenous wounds to the left lower extremity 1) The patient is remarkably more alert and oriented this AM. 2) we had another discussion about his upcoming procedure, the risks and the benefits.  All his questions were answered.  The patient still is consenting.  We will plan on the left above-the-knee amputation tomorrow with Dr. Delana Meyer. 3) The patient has been preoped.  Discussed with Dr. Eber Hong Simone Tuckey PA-C 08/27/2018 12:21 PM

## 2018-08-27 NOTE — Care Management Important Message (Signed)
Important Message  Patient Details  Name: KAYCEE MCGAUGH MRN: [id number] Date of Birth: Dec 10, 1940   Medicare Important Message Given:  Yes  Talked with Mr. Slight and he said he would like to wait and sign the Important Message after surgery and feels like he will be here until Monday and to check back then. A copy was left with the patient on Wednesday, August 26, 2018.   Juliann Pulse A Mykeria Garman 08/27/2018, 10:32 AM

## 2018-08-27 NOTE — Progress Notes (Addendum)
Inpatient Diabetes Program Recommendations  AACE/ADA: New Consensus Statement on Inpatient Glycemic Control   Target Ranges:  Prepandial:   less than 140 mg/dL      Peak postprandial:   less than 180 mg/dL (1-2 hours)      Critically ill patients:  140 - 180 mg/dL   Results for CHRISTIEN, FRANKL (MRN [id number]) as of 08/27/2018 10:09  Ref. Range 08/26/2018 08:08 08/26/2018 11:42 08/26/2018 17:31 08/26/2018 21:22 08/27/2018 07:45  Glucose-Capillary Latest Ref Range: 70 - 99 mg/dL 297 (H) 308 (H) 388 (H) 306 (H) 214 (H)   Review of Glycemic Control  Diabetes history: DM2 Outpatient Diabetes medications:Lantus 24 units QHS Current orders for Inpatient glycemic control:Lantus 18 units daily,Novolog 0-9 units TID with meals, Novolog 0-5 units QHS, Novolog 4 units TID with meals  Inpatient Diabetes Program Recommendations:  Insulin - Basal: Please considerincreasingLantus to20 units daily.   Insulin-Meal Coverage: Please consider increasing meal coverage to Novolog 7 units TID with meals for meal coverage if patient eats at least 50% of meals.  Thanks, Barnie Alderman, RN, MSN, CDE Diabetes Coordinator Inpatient Diabetes Program [phone] (Team Pager from 8am to 5pm)

## 2018-08-27 NOTE — Evaluation (Signed)
Occupational Therapy Evaluation Patient Details Name: Spencer BrunnerRichard T Edwards MRN: [id number] DOB: 06/08/1940 Today's Date: 08/27/2018    History of Present Illness 78 y.o. male with multiple medical problems including diabetes, CAD, and PVD status post right BKA.  Patient was admitted to the hospital on 08/20/2018 with sepsis from lower extremity infection.  Patient apparently was found at home on the floor after lying there for several days.  Patient is currently being medically managed for sepsis but there is concerned that he may need future amputation. Subsequent MRI reveals 4mm right posteriorial left frontal infarct.   Clinical Impression   Pt seen for OT evaluation this date. Prior to hospital admission, pt was modified independent from w/c level for mobility and basic ADL tasks. Pt required assist from a RN/friend who volunteers to assist pt with groceries and checking on medications. Pt receives Meals on Wheels, however, with the pandemic they have been coming much less frequently.   Pt lives in a 1 story home with basement (doesn't use) and typically sleeps on the couch. Currently pt demonstrates impairments in strength, activity tolerance, balance, and ability to perform ADL tasks at baseline level of functioning, requiring increased assist for all ADL and mobility.  Pt would benefit from skilled OT to address noted impairments and functional limitations (see below for any additional details) in order to maximize safety and independence while minimizing falls risk and caregiver burden.  Upon hospital discharge, recommend pt discharge to STR.    Follow Up Recommendations  SNF    Equipment Recommendations  Other (comment);Tub/shower bench;3 in 1 bedside commode(drop arm BSC and TTB)    Recommendations for Other Services PT consult     Precautions / Restrictions Precautions Precautions: Fall Restrictions Weight Bearing Restrictions: Yes RLE Weight Bearing: Non weight bearing Other  Position/Activity Restrictions: hx R BKA, LLE NWBing today 2/2 wounds and pending potential AKA      Mobility Bed Mobility               General bed mobility comments: deferred - will re-assess after tentative L AKA  Transfers                 General transfer comment: deferred - will re-assess after tentative L AKA    Balance Overall balance assessment: History of Falls                                         ADL either performed or assessed with clinical judgement   ADL Overall ADL's : Needs assistance/impaired Eating/Feeding: Set up;Bed level;Cueing for safety Eating/Feeding Details (indicate cue type and reason): pt demo'd difficulty taking lid off plate and with opening packets/containers requiring set up assist; nectar thick liquids and Dys 3 diet at this time; once set up, pt able to self feed with spoon and occasional cues for safety to take small bites/sips Grooming: Bed level;Set up                                 General ADL Comments: grossly Min A for UB ADL, Mod-Max A for LB ADL, bed pan log roll for toileting     Vision Baseline Vision/History: Wears glasses Wears Glasses: Reading only Patient Visual Report: No change from baseline       Perception     Praxis  Pertinent Vitals/Pain Pain Assessment: No/denies pain     Hand Dominance Right(does most with R, writes with L hand)   Extremity/Trunk Assessment Upper Extremity Assessment Upper Extremity Assessment: Generalized weakness(grossly 4-/5 bilaterally, mild tremors in B hands)   Lower Extremity Assessment Lower Extremity Assessment: Defer to PT evaluation;RLE deficits/detail;LLE deficits/detail RLE Deficits / Details: hx R BKA LLE Deficits / Details: significant wounds and skin breakdown with formal assessment limited 2/2 this       Communication Communication Communication: No difficulties   Cognition Arousal/Alertness: Awake/alert Behavior  During Therapy: WFL for tasks assessed/performed Overall Cognitive Status: Within Functional Limits for tasks assessed                                 General Comments: alert, oriented, appropriate throughout session   General Comments  volar aspect of R hand noted to be slightly edematous, RN notified    Exercises Other Exercises Other Exercises: safety training and instruction in modifications to meal set up to improve access and ease of self feeding Other Exercises: instruction in positioning of BUE to support optimal upright posture for self feeding, minimizing risk of aspiration, and minimize edema   Shoulder Instructions      Home Living Family/patient expects to be discharged to:: Private residence Living Arrangements: Alone Available Help at Discharge: Family;Friend(s);Available PRN/intermittently Type of Home: House Home Access: Ramped entrance     Home Layout: One level     Bathroom Shower/Tub: Chief Strategy OfficerTub/shower unit   Bathroom Toilet: Standard     Home Equipment: Environmental consultantWalker - 2 wheels;Cane - single point;Grab bars - tub/shower;Grab bars - toilet;Shower seat;Wheelchair - manual          Prior Functioning/Environment Level of Independence: Needs assistance  Gait / Transfers Assistance Needed: mod indep from w/c level ADL's / Homemaking Assistance Needed: mod indep with basic ADL tasks, "RN friend" assists with groceries and medication mgt, receives Meals on Wheels (typically daily but since pandemic started has only been coming 1x/wk)   Comments: Pt denies any other falls - fell off his couch where he sleeps        OT Problem List: Decreased strength;Increased edema;Decreased activity tolerance;Decreased knowledge of use of DME or AE      OT Treatment/Interventions: Self-care/ADL training;Therapeutic exercise;Therapeutic activities;DME and/or AE instruction;Patient/family education;Balance training;Manual therapy    OT Goals(Current goals can be found in  the care plan section) Acute Rehab OT Goals Patient Stated Goal: have the surgery on my leg and go home OT Goal Formulation: With patient Time For Goal Achievement: 09/10/18 Potential to Achieve Goals: Good ADL Goals Pt Will Transfer to Toilet: with mod assist;bedside commode(slide board transfer to drop arm BSC with Mod A) Additional ADL Goal #1: Pt will perform bed mobility with Min A in preparation for EOB ADL tasks and slide board transfers. Additional ADL Goal #2: Pt will independently perform learned desensitization strategies to support residual limb recovery following planned L AKA.  OT Frequency: Min 1X/week   Barriers to D/C:            Co-evaluation              AM-PAC OT "6 Clicks" Daily Activity     Outcome Measure Help from another person eating meals?: A Little Help from another person taking care of personal grooming?: A Little Help from another person toileting, which includes using toliet, bedpan, or urinal?: A Lot Help from another person bathing (  including washing, rinsing, drying)?: A Lot Help from another person to put on and taking off regular upper body clothing?: A Little Help from another person to put on and taking off regular lower body clothing?: A Lot 6 Click Score: 15   End of Session Nurse Communication: (R hand slightly swollen)  Activity Tolerance: Patient tolerated treatment well Patient left: in bed;with call bell/phone within reach;with bed alarm set;Other (comment)(RN and MD in room)  OT Visit Diagnosis: Other abnormalities of gait and mobility (R26.89);History of falling (Z91.81);Muscle weakness (generalized) (M62.81);Feeding difficulties (R63.3)                Time: 3545-6256 OT Time Calculation (min): 48 min Charges:  OT General Charges $OT Visit: 1 Visit OT Evaluation $OT Eval Moderate Complexity: 1 Mod OT Treatments $Self Care/Home Management : 8-22 mins $Therapeutic Activity: 8-22 mins  Jeni Salles, MPH, MS, OTR/L ascom  [phone] 08/27/18, 10:04 AM

## 2018-08-28 ENCOUNTER — Inpatient Hospital Stay: Payer: Medicare Other | Admitting: Anesthesiology

## 2018-08-28 ENCOUNTER — Encounter: Payer: Self-pay | Admitting: Anesthesiology

## 2018-08-28 ENCOUNTER — Encounter
Admission: EM | Disposition: A | Discharge status: Skilled Nursing Facility | Payer: Self-pay | Source: Home / Self Care | Attending: Internal Medicine

## 2018-08-28 DIAGNOSIS — I96 Gangrene, not elsewhere classified: Secondary | ICD-10-CM

## 2018-08-28 HISTORY — PX: AMPUTATION: SHX166

## 2018-08-28 LAB — SURGICAL PCR SCREEN
MRSA, PCR: POSITIVE — AB
Staphylococcus aureus: POSITIVE — AB

## 2018-08-28 LAB — BASIC METABOLIC PANEL
Anion gap: 7 (ref 5–15)
BUN: 23 mg/dL (ref 8–23)
CO2: 24 mmol/L (ref 22–32)
Calcium: 8.2 mg/dL — ABNORMAL LOW (ref 8.9–10.3)
Chloride: 117 mmol/L — ABNORMAL HIGH (ref 98–111)
Creatinine, Ser: 1.16 mg/dL (ref 0.61–1.24)
GFR calc Af Amer: >60 mL/min (ref >60)
GFR calc non Af Amer: 60 mL/min — ABNORMAL LOW (ref >60)
Glucose, Bld: 260 mg/dL — ABNORMAL HIGH (ref 70–99)
Potassium: 3.7 mmol/L (ref 3.5–5.1)
Sodium: 148 mmol/L — ABNORMAL HIGH (ref 135–145)

## 2018-08-28 LAB — PROTIME-INR
INR: 1.2 (ref 0.8–1.2)
Prothrombin Time: 14.8 seconds (ref 11.4–15.2)

## 2018-08-28 LAB — APTT: aPTT: 29 seconds (ref 24–36)

## 2018-08-28 LAB — CBC
HCT: 40.1 % (ref 39.0–52.0)
Hemoglobin: 12.5 g/dL — ABNORMAL LOW (ref 13.0–17.0)
MCH: 26.7 pg (ref 26.0–34.0)
MCHC: 31.2 g/dL (ref 30.0–36.0)
MCV: 85.7 fL (ref 80.0–100.0)
Platelets: 262 10*3/uL (ref 150–400)
RBC: 4.68 MIL/uL (ref 4.22–5.81)
RDW: 14.6 % (ref 11.5–15.5)
WBC: 9.5 10*3/uL (ref 4.0–10.5)
nRBC: 0 % (ref 0.0–0.2)

## 2018-08-28 LAB — GLUCOSE, CAPILLARY
Glucose-Capillary: 216 mg/dL — ABNORMAL HIGH (ref 70–99)
Glucose-Capillary: 204 mg/dL — ABNORMAL HIGH (ref 70–99)
Glucose-Capillary: 174 mg/dL — ABNORMAL HIGH (ref 70–99)
Glucose-Capillary: 171 mg/dL — ABNORMAL HIGH (ref 70–99)
Glucose-Capillary: 179 mg/dL — ABNORMAL HIGH (ref 70–99)

## 2018-08-28 LAB — MAGNESIUM: Magnesium: 2.3 mg/dL (ref 1.7–2.4)

## 2018-08-28 SURGERY — AMPUTATION, ABOVE KNEE
Anesthesia: Choice | Laterality: Left

## 2018-08-28 MED ORDER — OXYCODONE-ACETAMINOPHEN 5-325 MG PO TABS: 1.0000 | ORAL_TABLET | ORAL | Status: DC | PRN

## 2018-08-28 MED ORDER — INSULIN GLARGINE 100 UNIT/ML ~~LOC~~ SOLN
10.0000 [IU] | Freq: Once | SUBCUTANEOUS | Status: DC
  Filled 2018-08-28: qty 0.1

## 2018-08-28 MED ORDER — LACTATED RINGERS IV SOLN
INTRAVENOUS | Status: DC | PRN
  Administered 2018-08-28: 15:00:00 via INTRAVENOUS

## 2018-08-28 MED ORDER — SODIUM CHLORIDE 0.9 % IV SOLN
INTRAVENOUS | Status: DC | PRN
  Administered 2018-08-28: 15:00:00 75 ug/min via INTRAVENOUS

## 2018-08-28 MED ORDER — ALBUMIN HUMAN 5 % IV SOLN
INTRAVENOUS | Status: AC
  Administered 2018-08-28: 12.5 g via INTRAVENOUS
  Filled 2018-08-28: qty 250

## 2018-08-28 MED ORDER — ACETAMINOPHEN 10 MG/ML IV SOLN
INTRAVENOUS | Status: AC
  Filled 2018-08-28: qty 100

## 2018-08-28 MED ORDER — PROPOFOL 10 MG/ML IV BOLUS
INTRAVENOUS | Status: DC | PRN
  Administered 2018-08-28: 50 mg via INTRAVENOUS

## 2018-08-28 MED ORDER — CHLORHEXIDINE GLUCONATE CLOTH 2 % EX PADS
6.0000 | MEDICATED_PAD | Freq: Every day | CUTANEOUS | Status: DC
  Administered 2018-08-29 – 2018-08-30 (×2): 6 via TOPICAL

## 2018-08-28 MED ORDER — MORPHINE SULFATE (PF) 4 MG/ML IV SOLN: 4.0000 mg | INTRAVENOUS | Status: DC | PRN

## 2018-08-28 MED ORDER — ACETAMINOPHEN 10 MG/ML IV SOLN
INTRAVENOUS | Status: DC | PRN
  Administered 2018-08-28: 1000 mg via INTRAVENOUS

## 2018-08-28 MED ORDER — ALBUMIN HUMAN 5 % IV SOLN
12.5000 g | Freq: Once | INTRAVENOUS | Status: AC
  Administered 2018-08-28: 17:00:00 12.5 g via INTRAVENOUS

## 2018-08-28 MED ORDER — LIDOCAINE HCL (CARDIAC) PF 100 MG/5ML IV SOSY
PREFILLED_SYRINGE | INTRAVENOUS | Status: DC | PRN
  Administered 2018-08-28: 80 mg via INTRAVENOUS

## 2018-08-28 MED ORDER — BUPIVACAINE LIPOSOME 1.3 % IJ SUSP
INTRAMUSCULAR | Status: DC | PRN
  Administered 2018-08-28: 50 mL

## 2018-08-28 MED ORDER — FENTANYL CITRATE (PF) 100 MCG/2ML IJ SOLN
INTRAMUSCULAR | Status: DC | PRN
  Administered 2018-08-28 (×2): 50 ug via INTRAVENOUS

## 2018-08-28 MED ORDER — SUCCINYLCHOLINE CHLORIDE 20 MG/ML IJ SOLN
INTRAMUSCULAR | Status: DC | PRN
  Administered 2018-08-28: 80 mg via INTRAVENOUS

## 2018-08-28 MED ORDER — PHENYLEPHRINE HCL (PRESSORS) 10 MG/ML IV SOLN
INTRAVENOUS | Status: DC | PRN
  Administered 2018-08-28: 100 ug via INTRAVENOUS
  Administered 2018-08-28 (×2): 200 ug via INTRAVENOUS
  Administered 2018-08-28: 100 ug via INTRAVENOUS
  Administered 2018-08-28 (×3): 200 ug via INTRAVENOUS
  Administered 2018-08-28 (×2): 100 ug via INTRAVENOUS
  Administered 2018-08-28: 200 ug via INTRAVENOUS
  Administered 2018-08-28: 100 ug via INTRAVENOUS

## 2018-08-28 MED ORDER — FENTANYL CITRATE (PF) 100 MCG/2ML IJ SOLN
INTRAMUSCULAR | Status: AC
  Filled 2018-08-28: qty 2

## 2018-08-28 MED ORDER — KETAMINE HCL 10 MG/ML IJ SOLN
INTRAMUSCULAR | Status: DC | PRN
  Administered 2018-08-28: 15 mg via INTRAVENOUS

## 2018-08-28 MED ORDER — KETAMINE HCL 50 MG/ML IJ SOLN
INTRAMUSCULAR | Status: AC
  Filled 2018-08-28: qty 10

## 2018-08-28 MED ORDER — MUPIROCIN 2 % EX OINT
1.0000 "application " | TOPICAL_OINTMENT | Freq: Two times a day (BID) | CUTANEOUS | Status: DC
  Administered 2018-08-28 – 2018-08-31 (×6): 1 via NASAL
  Filled 2018-08-28: qty 22

## 2018-08-28 SURGICAL SUPPLY — 49 items
BAG COUNTER SPONGE EZ (MISCELLANEOUS) ×1 IMPLANT
BANDAGE ELASTIC 6 LF NS (GAUZE/BANDAGES/DRESSINGS) ×3 IMPLANT
BLADE SAW SAG 25.4X90 (BLADE) ×3 IMPLANT
BLADE SURG SZ10 CARB STEEL (BLADE) ×3 IMPLANT
BNDG COHESIVE 4X5 TAN STRL (GAUZE/BANDAGES/DRESSINGS) ×3 IMPLANT
BNDG GAUZE 4.5X4.1 6PLY STRL (MISCELLANEOUS) ×3 IMPLANT
CANISTER SUCT 1200ML W/VALVE (MISCELLANEOUS) ×3 IMPLANT
CANISTER WOUND CARE 500ML ATS (WOUND CARE) IMPLANT
CHLORAPREP W/TINT 26 (MISCELLANEOUS) ×3 IMPLANT
COUNTER SPONGE BAG EZ (MISCELLANEOUS)
COVER WAND RF STERILE (DRAPES) ×3 IMPLANT
DERMABOND ADVANCED (GAUZE/BANDAGES/DRESSINGS)
DERMABOND ADVANCED .7 DNX12 (GAUZE/BANDAGES/DRESSINGS) ×1 IMPLANT
DRAPE INCISE IOBAN 66X45 STRL (DRAPES) ×3 IMPLANT
DRSG GAUZE FLUFF 36X18 (GAUZE/BANDAGES/DRESSINGS) ×3 IMPLANT
DRSG VAC ATS MED SENSATRAC (GAUZE/BANDAGES/DRESSINGS) IMPLANT
ELECT CAUTERY BLADE 6.4 (BLADE) ×3 IMPLANT
ELECT REM PT RETURN 9FT ADLT (ELECTROSURGICAL) ×3
ELECTRODE REM PT RTRN 9FT ADLT (ELECTROSURGICAL) ×1 IMPLANT
GLOVE BIO SURGEON STRL SZ7 (GLOVE) ×3 IMPLANT
GLOVE INDICATOR 7.5 STRL GRN (GLOVE) ×3 IMPLANT
GLOVE SURG SYN 8.0 (GLOVE) ×3 IMPLANT
GLOVE SURG SYN 8.0 PF PI (GLOVE) ×1 IMPLANT
GOWN STRL REUS W/ TWL LRG LVL3 (GOWN DISPOSABLE) ×2 IMPLANT
GOWN STRL REUS W/ TWL XL LVL3 (GOWN DISPOSABLE) ×1 IMPLANT
GOWN STRL REUS W/TWL LRG LVL3 (GOWN DISPOSABLE) ×4
GOWN STRL REUS W/TWL XL LVL3 (GOWN DISPOSABLE) ×2
HANDLE YANKAUER SUCT BULB TIP (MISCELLANEOUS) ×3 IMPLANT
KIT TURNOVER KIT A (KITS) ×3 IMPLANT
NS IRRIG 500ML POUR BTL (IV SOLUTION) ×3 IMPLANT
PACK EXTREMITY ARMC (MISCELLANEOUS) ×3 IMPLANT
PAD PREP 24X41 OB/GYN DISP (PERSONAL CARE ITEMS) ×3 IMPLANT
SPONGE LAP 18X18 RF (DISPOSABLE) ×6 IMPLANT
STAPLER SKIN PROX 35W (STAPLE) ×2 IMPLANT
STOCKINETTE M/LG 89821 (MISCELLANEOUS) ×3 IMPLANT
SUT ETHIBOND 0 36 GRN (SUTURE) IMPLANT
SUT MNCRL 4-0 (SUTURE) ×4
SUT MNCRL 4-0 27XMFL (SUTURE) ×2
SUT SILK 2 0 (SUTURE)
SUT SILK 2-0 18XBRD TIE 12 (SUTURE) IMPLANT
SUT SILK 3 0 (SUTURE) ×2
SUT SILK 3-0 18XBRD TIE 12 (SUTURE) ×1 IMPLANT
SUT VIC AB 0 CT1 36 (SUTURE) ×18 IMPLANT
SUT VIC AB 3-0 SH 27 (SUTURE) ×4
SUT VIC AB 3-0 SH 27X BRD (SUTURE) ×2 IMPLANT
SUT VICRYL PLUS ABS 0 54 (SUTURE) ×3 IMPLANT
SUTURE MNCRL 4-0 27XMF (SUTURE) ×1 IMPLANT
SYR 20CC LL (SYRINGE) ×6 IMPLANT
TAPE UMBIL 1/8X18 RADIOPA (MISCELLANEOUS) ×3 IMPLANT

## 2018-08-28 NOTE — Anesthesia Postprocedure Evaluation (Signed)
Anesthesia Post Note  Patient: Spencer Edwards  Procedure(s) Performed: AMPUTATION ABOVE KNEE (Left )  Patient location during evaluation: PACU Anesthesia Type: General Level of consciousness: awake and alert Pain management: pain level controlled Vital Signs Assessment: post-procedure vital signs reviewed and stable Respiratory status: spontaneous breathing, nonlabored ventilation and respiratory function stable Cardiovascular status: stable Postop Assessment: no apparent nausea or vomiting Anesthetic complications: no     Last Vitals:  Vitals:   08/28/18 1910 08/28/18 1934  BP: (!) 101/55 (!) 96/53  Pulse: 79 78  Resp: 18 18  Temp:  36.4 C  SpO2: 100%     Last Pain:  Vitals:   08/28/18 1851  TempSrc:   PainSc: [place]

## 2018-08-28 NOTE — Progress Notes (Signed)
ID  PT in OR getting AKA  Patient Vitals for the past 24 hrs:  BP Temp Temp src Pulse Resp SpO2  08/28/18 0803 121/60 97.9 F (36.6 C) Oral 80 18 95 %  08/27/18 2251 122/67 98.3 F (36.8 C) Oral 88 18 99 %  08/27/18 1543 (!) 104/53 - - 85 - 100 %  08/27/18 1453 (!) 95/59 97.6 F (36.4 C) Oral 86 18 93 %    CBC Latest Ref Rng & Units 08/28/2018 08/26/2018 08/25/2018  WBC 4.0 - 10.5 K/uL 9.5 15.0(H) 17.6(H)  Hemoglobin 13.0 - 17.0 g/dL 12.5(L) 12.0(L) 13.0  Hematocrit 39.0 - 52.0 % 40.1 39.1 42.0  Platelets 150 - 400 K/uL 262 197 202    CMP Latest Ref Rng & Units 08/28/2018 08/26/2018 08/25/2018  Glucose 70 - 99 mg/dL 260(H) 324(H) 209(H)  BUN 8 - 23 mg/dL 23 24(H) 24(H)  Creatinine 0.61 - 1.24 mg/dL 1.16 1.35(H) 1.43(H)  Sodium 135 - 145 mmol/L 148(H) 143 150(H)  Potassium 3.5 - 5.1 mmol/L 3.7 3.5 3.5  Chloride 98 - 111 mmol/L 117(H) 118(H) 123(H)  CO2 22 - 32 mmol/L 24 22 21(L)  Calcium 8.9 - 10.3 mg/dL 8.2(L) 7.8(L) 8.0(L)  Total Protein 6.5 - 8.1 g/dL - - -  Total Bilirubin 0.3 - 1.2 mg/dL - - -  Alkaline Phos 38 - 126 U/L - - -  AST 15 - 41 U/L - - -  ALT 0 - 44 U/L - - -   Impression/Recommendation Fall and lying on the floor for 5 days  With multiple wounds Left leg necrotic areas, with severe infection- on zosyn In the OR getting AKA  Morganella and coag neg staph ( NOT MRSA) bacteremia- the coag neg staph is a contaminant On zosyn  AKI ID will follow hi peripherally this weekend. Call if needed

## 2018-08-28 NOTE — Progress Notes (Signed)
Spoke with Dr Marcello Moores about patient getting Lantus. Per Dr Marcello Moores do NOT give long acting insulin. Sliding scale is ok.

## 2018-08-28 NOTE — Anesthesia Procedure Notes (Signed)
Procedure Name: Intubation Date/Time: 08/28/2018 2:45 PM Performed by: Chanetta Marshall, CRNA Pre-anesthesia Checklist: Patient identified, Emergency Drugs available, Suction available and Patient being monitored Patient Re-evaluated:Patient Re-evaluated prior to induction Oxygen Delivery Method: Circle system utilized Preoxygenation: Pre-oxygenation with 100% oxygen Induction Type: IV induction Ventilation: Mask ventilation without difficulty Grade View: Grade II Tube type: Oral Number of attempts: 1 Airway Equipment and Method: Stylet and Oral airway Placement Confirmation: ETT inserted through vocal cords under direct vision,  positive ETCO2,  breath sounds checked- equal and bilateral and CO2 detector Secured at: 21 cm Tube secured with: Tape Dental Injury: Teeth and Oropharynx as per pre-operative assessment

## 2018-08-28 NOTE — H&P (Signed)
[facility] History & Physical Update  The patient was interviewed and re-examined.  The patient's previous History and Physical has been reviewed and is unchanged.  There is no change in the plan of care. We plan to proceed with the scheduled procedure.  [name], MD  08/28/2018, 3:50 PM

## 2018-08-28 NOTE — Progress Notes (Signed)
notified by central tele pt had wide QRS/VTach. Vitals taken, (VSS), Pt states "he feels fine". MD [place] notified. No orders received.

## 2018-08-28 NOTE — Anesthesia Post-op Follow-up Note (Signed)
Anesthesia QCDR form completed.        

## 2018-08-28 NOTE — Op Note (Signed)
[facility]   OPERATIVE NOTE   PROCEDURE:  Left above-the-knee amputation  PRE-OPERATIVE DIAGNOSIS: Left foot gangrene  POST-OPERATIVE DIAGNOSIS: same as above  SURGEON: Katha Cabal, MD  ASSISTANT(S): Ms. Hezzie Bump  ANESTHESIA: general  ESTIMATED BLOOD LOSS: 100 cc  FINDING(S): Healthy appearing muscle bellies  SPECIMEN(S):  Left above-the-knee amputation  INDICATIONS:   Spencer Edwards is a 78 y.o. male who presents with left foot and ankle gangrene.  The patient is scheduled for a left above-the-knee amputation.  I discussed in depth with the patient the risks, benefits, and alternatives to this procedure.  The patient is aware that the risk of this operation included but are not limited to:  bleeding, infection, myocardial infarction, stroke, death, failure to heal amputation wound, and possible need for more proximal amputation.  The patient is aware of the risks and agrees proceed forward with the procedure.  DESCRIPTION: After full informed written consent was obtained from the patient, the patient was taken to the operating room, and placed supine upon the operating table.  Prior to induction, the patient received IV antibiotics.  After obtaining adequate anesthesia, the patient was prepped and draped in the standard fashion for a above-the-knee amputation.  A first assistant is required in order to allow for a safe and more efficient operation.  Duties include retraction of tissues to allow for optimal exposure, assisting with suture ligation of vessels as well as maintaining a clear field of view with suction as needed.  Further duties include assisting with patient positioning during the surgery as well as wound closure.  I believe that this procedure requires a first assistant in order for it to be performed at a level in keeping with the high standards of this institution.  I marked out the anterior and posterior flaps for a  fish-mouth type of amputation.  I made the incisions for these flaps, and then dissected through the subcutaneous tissue, fascia, and muscles circumferentially.  I elevated  the periosteal tissue 4-5 cm more proximal than the anterior skin flap.  I then transected the femur with a power saw at this level.  Then I smoothed out the rough edges of the bone.  At this point, the specimen was passed off the field as the above-the-knee amputation.  At this point, I clamped all visibly bleeding arteries and veins using a combination of suture ligation with silk suture and electrocautery.   Bleeding continued to be controlled with electrocautery and suture ligature.  The stump was washed off with sterile normal saline and no further active bleeding was noted.  I reapproximated the anterior and posterior fascia  with interrupted stitches of 0 Vicryl.  This was completed along the entire length of anterior and posterior fascia until there were no more loose space in the fascial line. The subcutaneous tissue was then approximated with 2-0 vicryl sutures. The skin was then  reapproximated with staples.  The stump was washed off and dried.  The incision was dressed with Xeroform and ABD pads, and  then fluffs were applied.  Kerlix was wrapped around the leg and then gently an ACE wrap was applied.  A large Ioban was then placed over the ACE wrap to secure the dressing. The patient was then awakened and take to the recovery room in stable condition.   COMPLICATIONS: none  CONDITION: stable  Spencer Edwards  08/28/2018, 3:58 PM   This note was created with Dragon Medical transcription system. Any errors in  dictation are purely unintentional.

## 2018-08-28 NOTE — Transfer of Care (Signed)
Immediate Anesthesia Transfer of Care Note  Patient: Spencer Edwards  Procedure(s) Performed: AMPUTATION ABOVE KNEE (Left )  Patient Location: PACU  Anesthesia Type:General  Level of Consciousness: awake  Airway & Oxygen Therapy: Patient Spontanous Breathing and Patient connected to face mask oxygen  Post-op Assessment: Report given to RN and Post -op Vital signs reviewed and stable  Post vital signs: Reviewed and stable  Last Vitals:  Vitals Value Taken Time  BP 89/55 08/28/18 1612  Temp    Pulse 81 08/28/18 1615  Resp 12 08/28/18 1615  SpO2 97 % 08/28/18 1615  Vitals shown include unvalidated device data.  Last Pain:  Vitals:   08/28/18 1406  TempSrc:   PainSc: 0-No pain         Complications: No apparent anesthesia complications

## 2018-08-28 NOTE — Progress Notes (Signed)
[facility] [place] [facility]     PATIENT NAME: Spencer BumpsRichard Edwards    MR#:  [id number]  DATE OF BIRTH:  08/10/1940  SUBJECTIVE:   No acute events overnight. Plan for Left AKA today.   REVIEW OF SYSTEMS:    Review of Systems  Constitutional: Negative for chills and fever.  HENT: Negative for congestion and tinnitus.   Eyes: Negative for blurred vision and double vision.  Respiratory: Negative for cough, shortness of breath and wheezing.   Cardiovascular: Negative for chest pain, orthopnea and PND.  Gastrointestinal: Negative for abdominal pain, diarrhea, nausea and vomiting.  Genitourinary: Negative for dysuria and hematuria.  Neurological: Positive for weakness (Generalized weakness. ). Negative for dizziness, sensory change and focal weakness.  All other systems reviewed and are negative.   Nutrition: Dysphagia 1 with nectar thick liquids Tolerating Diet: NPO for surgery today.  Tolerating PT: Await Eval.   DRUG ALLERGIES:  No Known Allergies  VITALS:  Blood pressure 121/69, pulse 86, temperature 97.9 F (36.6 C), temperature source Oral, resp. rate 18, height 5\' 10"  (1.778 m), weight 83 kg, SpO2 99 %.  PHYSICAL EXAMINATION:   Physical Exam  GENERAL:  78 y.o.-year-old thin cachectic patient lying in bed in no acute distress.  EYES: Pupils equal, round, reactive to light and accommodation. No scleral icterus. Extraocular muscles intact.  HEENT: Head atraumatic, normocephalic. Oropharynx and nasopharynx clear.  NECK:  Supple, no jugular venous distention. No thyroid enlargement, no tenderness.  LUNGS: Normal breath sounds bilaterally, no wheezing, rales, rhonchi. No use of accessory muscles of respiration.  CARDIOVASCULAR: S1, S2 normal. No murmurs, rubs, or gallops.  ABDOMEN: Soft, nontender, nondistended. Bowel sounds present. No organomegaly or mass.  EXTREMITIES: No cyanosis, clubbing or edema b/l.   Right BKA.  Left lower ext. Wounds/gangrene as  shown below.  NEUROLOGIC: Cranial nerves II through XII are intact. No focal Motor or sensory deficits b/l. Globally weake  PSYCHIATRIC: The patient is alert and oriented x 3.  SKIN: No obvious rash, lesion, Left lower ext. Gangrene as shown below.             LABORATORY PANEL:   CBC Recent Labs  Lab 08/28/18 0435  WBC 9.5  HGB 12.5*  HCT 40.1  PLT 262   ------------------------------------------------------------------------------------------------------------------  Chemistries  Recent Labs  Lab 08/28/18 0435  NA 148*  K 3.7  CL 117*  CO2 24  GLUCOSE 260*  BUN 23  CREATININE 1.16  CALCIUM 8.2*  MG 2.3   ------------------------------------------------------------------------------------------------------------------  Cardiac Enzymes No results for input(s): TROPONINI in the last 168 hours. ------------------------------------------------------------------------------------------------------------------  RADIOLOGY:  No results found.   ASSESSMENT AND PLAN:   78 year old male patient with history of diabetes mellitus type 2, coronary disease, non-STEMI,, diabetes mellitus type 2, GERD, hyperlipidemia, hypertension, peripheral vascular disease, rhabdomyolysis currently under hospitalist service  * Sepsis secondary to left leg infection and Gangrene Continue IV  Zosyn for now.   -Seen by infectious disease and also vascular surgery.  Patient is competent to make decisions and plan for left AKA today.   *  Morganella & Coag. Neg staph bacteremia  -Continue Zosyn, off vancomycin as coag negative staph is likely contaminant.  * Acute frontal CVA - cont. ASA Status post neurology evaluation Currently on neurochecks Carotid ultrasound showed bilateral minimal atherosclerotic disease Echocardiogram showing EF of 35 to 40% with no evidence of thrombus. - cont. PT as tolerated.   * Elevated troponin -Suspected to be supply demand ischemia from  sepsis.   Troponins were mildly elevated and seen by cardiology and echo showing reduced systolic function of EF of 35 to 40%.  No acute cardiac intervention presently. -Continue medical management.  * Acute rhabdomyolysis improved and resolved with IV fluids.  Continue to hold statin medication  * Acute hypernatremia - resolved with D5W and will cont. To monitor.   * Uncontrolled diabetes mellitus -Blood sugars stable, patient's Lantus was held this morning as patient had surgery today.  Continue sliding scale insulin for now.  Follow blood sugars closely.  * GI bleed-resolved.  Hemoglobin stable. -Seen by gastroenterology and no plans for acute intervention given patient's multiple comorbidities.  Continue IV Protonix.  * Acute renal insufficiency - resolved w/ fluids and will cont. To monitor.  - renal dose meds and avoid nephrotoxins.   * Decubitus ulcer infected Wound care consult appreciated Surgery consult done, no debridement recommended - cont. abx as mentioned above.   * BPH - cont. Flomax.   Pt. Will likely need SNF/STR upon discharge.   All the records are reviewed and case discussed with Care Management/Social Worker. Management plans discussed with the patient, family and they are in agreement.  CODE STATUS: DNR  TOTAL TIME TAKING CARE OF THIS PATIENT: 30 minutes.   POSSIBLE D/C IN 2-3 DAYS, DEPENDING ON CLINICAL CONDITION.   Henreitta Leber M.D on 08/28/2018 at 1:42 PM  Between 7am to 6pm - Pager - [phone]  After 6pm go to [email or web address] - Proofreader  [facility] [place] [facility]  Office  [phone]  CC: Primary care physician; Patient, No Pcp Per

## 2018-08-28 NOTE — NC FL2 (Signed)
[place] LEVEL OF CARE SCREENING TOOL     IDENTIFICATION  Patient Name: Spencer Edwards Birthdate: Apr 12, 1940 Sex: male Admission Date (Current Location): 08/20/2018  [place] and [place] Number:  Engineering geologist and Address:  [facility], [address] [place], [place] [address]      Provider Number: [id number]  Attending Physician Name and Address:  Henreitta Leber, MD  Relative Name and Phone Number:       Current Level of Care: Hospital Recommended Level of Care: [place] Prior Approval Number:    Date Approved/Denied:   PASRR Number: [id number]  Discharge Plan: SNF     Current Diagnoses: Patient Active Problem List   Diagnosis Date Noted  . Pressure injury of skin 08/21/2018  . Diabetic foot infection ([place])   . Palliative care encounter   . Sepsis ([place]) 08/20/2018  . Coronary artery disease   . Hyperlipidemia   . Hypertension     Orientation RESPIRATION BLADDER Height & Weight     Self, Time, Situation, Place  Normal Incontinent Weight: 182 lb 15.7 oz (83 kg) Height:  5\' 10"  (177.8 cm)  BEHAVIORAL SYMPTOMS/MOOD NEUROLOGICAL BOWEL NUTRITION STATUS      Incontinent Diet(Diet: NPO for surgery to be advanced.)  AMBULATORY STATUS COMMUNICATION OF NEEDS Skin   Extensive Assist Verbally PU Stage and Appropriate Care, Surgical wounds(Incision: New left AKA. Pressure Ulcer unstagable on saccrum.)                       Personal Care Assistance Level of Assistance  Bathing, Feeding, Dressing Bathing Assistance: Limited assistance Feeding assistance: Independent Dressing Assistance: Limited assistance     Functional Limitations Info  Sight, Hearing, Speech Sight Info: Adequate Hearing Info: Adequate Speech Info: Adequate    SPECIAL CARE FACTORS FREQUENCY  PT (By licensed PT), OT (By licensed OT)     PT Frequency: 5 OT Frequency: 5            Contractures      Additional  Factors Info  Code Status, Allergies Code Status Info: DNR Allergies Info: No Known Allergies.           Current Medications (08/28/2018):  This is the current hospital active medication list Current Facility-Administered Medications  Medication Dose Route Frequency Provider Last Rate Last Dose  . [MAR Hold] 0.9 %  sodium chloride infusion   Intravenous PRN Henreitta Leber, MD 75 mL/hr at 08/28/18 0241    . 0.9 %  sodium chloride infusion   Intravenous Continuous Stegmayer, Janalyn Harder, PA-C   Stopped at 08/28/18 [phone]  . [MAR Hold] acetaminophen (TYLENOL) tablet 650 mg  650 mg Oral Q6H PRN Loletha Grayer, MD   650 mg at 08/23/18 2111   Or  . [MAR Hold] acetaminophen (TYLENOL) suppository 650 mg  650 mg Rectal Q6H PRN Loletha Grayer, MD      . Doug Sou Hold] ALPRAZolam Duanne Moron) tablet 0.25 mg  0.25 mg Oral TID Loletha Grayer, MD   0.25 mg at 08/27/18 2230  . [MAR Hold] aspirin tablet 325 mg  325 mg Oral Daily Saundra Shelling, MD   325 mg at 08/27/18 0938  . [MAR Hold] bisacodyl (DULCOLAX) suppository 10 mg  10 mg Rectal PRN Loletha Grayer, MD   10 mg at 08/23/18 0859  . [MAR Hold] food thickener (THICK IT) powder   Oral PRN Saundra Shelling, MD      . Doug Sou Hold] guaiFENesin-dextromethorphan (ROBITUSSIN DM)  100-10 MG/5ML syrup 5 mL  5 mL Oral Q4H PRN Ihor AustinPyreddy, Pavan, MD   5 mL at 08/23/18 0121  . [MAR Hold] insulin aspart (novoLOG) injection 0-5 Units  0-5 Units Subcutaneous QHS Alford HighlandWieting, Jahsiah, MD   3 Units at 08/27/18 2232  . [MAR Hold] insulin aspart (novoLOG) injection 0-9 Units  0-9 Units Subcutaneous TID WC Alford HighlandWieting, Yonis, MD   3 Units at 08/28/18 1201  . [MAR Hold] insulin aspart (novoLOG) injection 7 Units  7 Units Subcutaneous TID WC Houston SirenSainani, Vivek J, MD   7 Units at 08/27/18 1738  . [MAR Hold] insulin glargine (LANTUS) injection 10 Units  10 Units Subcutaneous Once Houston SirenSainani, Vivek J, MD      . Mitzi Hansen[MAR Hold] insulin glargine (LANTUS) injection 20 Units  20 Units Subcutaneous Daily  Sainani, Rolly PancakeVivek J, MD      . Mitzi Hansen[MAR Hold] ipratropium-albuterol (DUONEB) 0.5-2.5 (3) MG/3ML nebulizer solution 3 mL  3 mL Nebulization Q4H PRN Alford HighlandWieting, Maxi, MD      . Mitzi Hansen[MAR Hold] ondansetron [facility](ZOFRAN) tablet 4 mg  4 mg Oral Q6H PRN Alford HighlandWieting, Norah, MD       Or  . Mitzi Hansen[MAR Hold] ondansetron [facility](ZOFRAN) injection 4 mg  4 mg Intravenous Q6H PRN Alford HighlandWieting, Humberto, MD      . Mitzi Hansen[MAR Hold] oxyCODONE (Oxy IR/ROXICODONE) immediate release tablet 5 mg  5 mg Oral Q4H PRN Alford HighlandWieting, Chanz, MD      . Mitzi Hansen[MAR Hold] pantoprazole (PROTONIX) injection 40 mg  40 mg Intravenous Q12H Ihor AustinPyreddy, Pavan, MD   40 mg at 08/28/18 1153  . [MAR Hold] piperacillin-tazobactam (ZOSYN) IVPB 3.375 g  3.375 g Intravenous Q8H Bertram SavinSimpson, Michael L, RPH 12.5 mL/hr at 08/28/18 1356 3.375 g at 08/28/18 1356  . [MAR Hold] tamsulosin (FLOMAX) capsule 0.4 mg  0.4 mg Oral Daily Alford HighlandWieting, Leon, MD   0.4 mg at 08/27/18 [id number]   Facility-Administered Medications Ordered in Other Encounters  Medication Dose Route Frequency Provider Last Rate Last Dose  . ketamine (KETALAR) injection    Anesthesia Intra-op Disser, Anne NgAndrew L, CRNA   15 mg at 08/28/18 1439  . lidocaine (cardiac) 100 mg/875mL (XYLOCAINE) injection 2%   Intravenous Anesthesia Intra-op Disser, Anne NgAndrew L, CRNA   80 mg at 08/28/18 1439  . propofol (DIPRIVAN) 10 mg/mL bolus/IV push    Anesthesia Intra-op Disser, Anne NgAndrew L, CRNA   50 mg at 08/28/18 1439     Discharge Medications: Please see discharge summary for a list of discharge medications.  Relevant Imaging Results:  Relevant Lab Results:   Additional Information SSN: [id number]  Rhetta Cleek, Darleen CrockerBailey M, LCSW

## 2018-08-29 LAB — TYPE AND SCREEN
ABO/RH(D): O POS
Antibody Screen: NEGATIVE
Unit division: 0
Unit division: 0

## 2018-08-29 LAB — BPAM RBC
Blood Product Expiration Date: 202008112359
Blood Product Expiration Date: 202008122359
Unit Type and Rh: 5100
Unit Type and Rh: 5100

## 2018-08-29 LAB — GLUCOSE, CAPILLARY
Glucose-Capillary: 176 mg/dL — ABNORMAL HIGH (ref 70–99)
Glucose-Capillary: 157 mg/dL — ABNORMAL HIGH (ref 70–99)
Glucose-Capillary: 190 mg/dL — ABNORMAL HIGH (ref 70–99)
Glucose-Capillary: 204 mg/dL — ABNORMAL HIGH (ref 70–99)

## 2018-08-29 LAB — PREPARE RBC (CROSSMATCH)

## 2018-08-29 MED ORDER — PANTOPRAZOLE SODIUM 40 MG PO TBEC
40.0000 mg | DELAYED_RELEASE_TABLET | Freq: Every day | ORAL | Status: DC
  Administered 2018-08-30 – 2018-08-31 (×2): 40 mg via ORAL
  Filled 2018-08-29 (×2): qty 1

## 2018-08-29 MED ORDER — MORPHINE SULFATE (PF) 2 MG/ML IV SOLN: 2.0000 mg | INTRAVENOUS | Status: DC | PRN

## 2018-08-29 NOTE — Progress Notes (Signed)
PT Cancellation Note  Patient Details Name: Spencer Edwards MRN: [id number] DOB: 10/04/1940   Cancelled Treatment:    Reason Eval/Treat Not Completed: Patient declined, no reason specified.  PT consult received.  Chart reviewed.  Upon PT arrival to pt's room, pt resting in bed.  Therapist introduced herself and purpose of PT consult.  Pt stating "not today" because he was "chilling".  Educated pt on benefits of participation in physical therapy and discussed various options (including sitting edge of bed or only ex's in bed) but pt continued to refuse any attempts at physical therapy.  Pt reporting he wanted to participate in physical therapy, but not today because he was not ready (too soon after surgery).  Therapist discussed attempting to see pt tomorrow but pt reported it was still too soon and it was the weekend and pt stated he would prefer to start on a Monday.  Will re-attempt PT evaluation at a later date/time.  Leitha Bleak, PT 08/29/18, 3:56 PM [phone]

## 2018-08-29 NOTE — Progress Notes (Signed)
Occupational Therapy Treatment Patient Details Name: Spencer BrunnerRichard T Edwards MRN: [id number] DOB: 06/05/1940 Today's Date: 08/29/2018    History of present illness 78 y.o. male with multiple medical problems including diabetes, CAD, and PVD status post right BKA.  Patient was admitted to the hospital on 08/20/2018 with sepsis from lower extremity infection.  Patient apparently was found at home on the floor after lying there for several days.  Patient is currently being medically managed for sepsis but there is concerned that he may need future amputation. Subsequent MRI reveals 4mm right posteriorial left frontal infarct.   OT comments  Patient seen for reassessment for OT after surgery for left AKA, patient appears to be at the same level as evaluated prior to surgical intervention in terms of self care tasks, UB strength and participation in UE self care.  Patient declined transfers this date, therefore will need to be assessed for bed mobility and sliding board transfers when he is agreeable. He did participate in light grooming, dressing UE and ROM of bilateral UEs with functional reaching tasks.  Continue to work towards goals in POC to increase independence in daily tasks.    Follow Up Recommendations  SNF    Equipment Recommendations  Other (comment);Tub/shower bench;3 in 1 bedside commode    Recommendations for Other Services      Precautions / Restrictions Precautions Precautions: Fall Restrictions Weight Bearing Restrictions: Yes RLE Weight Bearing: Non weight bearing Other Position/Activity Restrictions: hx R BKA, surgery for AKA L       Mobility Bed Mobility               General bed mobility comments: Attempted bed rolling but patient declined any transfers this date.  Transfers                      Balance                                           ADL either performed or assessed with clinical judgement   ADL Overall ADL's : Needs  assistance/impaired     Grooming: Set up;Bed level   Upper Body Bathing: Minimal assistance   Lower Body Bathing: Maximal assistance   Upper Body Dressing : Minimal assistance   Lower Body Dressing: Maximal assistance                 General ADL Comments: Patient reassessed this date after surgery for L AKA, Patient declines any transfers or bed mobility and states, "I don't want to, its too soon, I just had surgery." He was willing to participate in ADL tasks and some ROM exercises for UE, appears to be at the level he was when OT evaluated patient prior to surgery.     Vision       Perception     Praxis      Cognition Arousal/Alertness: Awake/alert Behavior During Therapy: WFL for tasks assessed/performed Overall Cognitive Status: Within Functional Limits for tasks assessed                                          Exercises     Shoulder Instructions       General Comments      Pertinent Vitals/ Pain  Pain Assessment: No/denies pain  Home Living                                          Prior Functioning/Environment              Frequency  Min 1X/week        Progress Toward Goals  OT Goals(current goals can now be found in the care plan section)  Progress towards OT goals: Progressing toward goals  Acute Rehab OT Goals Patient Stated Goal: have the surgery on my leg and go home OT Goal Formulation: With patient Time For Goal Achievement: 09/10/18 Potential to Achieve Goals: Good  Plan Discharge plan remains appropriate    Co-evaluation                 AM-PAC OT "6 Clicks" Daily Activity     Outcome Measure   Help from another person eating meals?: A Little Help from another person taking care of personal grooming?: A Little Help from another person toileting, which includes using toliet, bedpan, or urinal?: A Lot Help from another person bathing (including washing, rinsing, drying)?: A  Lot Help from another person to put on and taking off regular upper body clothing?: A Little Help from another person to put on and taking off regular lower body clothing?: A Lot 6 Click Score: 15    End of Session    OT Visit Diagnosis: Other abnormalities of gait and mobility (R26.89);History of falling (Z91.81);Muscle weakness (generalized) (M62.81);Feeding difficulties (R63.3)   Activity Tolerance     Patient Left in bed;with call bell/phone within reach;with bed alarm set;Other (comment)   Nurse Communication          Time: 1510-1530 OT Time Calculation (min): 20 min  Charges: OT General Charges $OT Visit: 1 Visit OT Treatments $Self Care/Home Management : 8-22 mins  Amy T Lovett, OTR/L, CLT    Lovett,Amy 08/29/2018, 3:42 PM

## 2018-08-29 NOTE — Progress Notes (Signed)
1 Day Post-Op   Subjective/Chief Complaint: S/P left AKA.  Doing well and is alert and oriented. He denies any issues.    Objective: Vital signs in last 24 hours: Temp:  [97.6 F (36.4 C)-98.4 F (36.9 C)] 98.4 F (36.9 C) (07/18 0801) Pulse Rate:  [78-94] 94 (07/18 0801) Resp:  [10-24] 17 (07/18 0801) BP: (79-121)/(46-71) 101/55 (07/18 0801) SpO2:  [94 %-100 %] 99 % (07/18 0801) Weight:  [83 kg] 83 kg (07/17 1406) Last BM Date: 08/29/18  Intake/Output from previous day: 07/17 0701 - 07/18 0700 In: -  Out: 50 [Blood:50] Intake/Output this shift: Total I/O In: 240 [P.O.:240] Out: -   General appearance: alert, cooperative and appears stated age Head: Normocephalic, without obvious abnormality, atraumatic Eyes: conjunctivae/corneas clear. PERRL, EOM's intact. Fundi benign. Resp: clear to auscultation bilaterally Extremities: Bilateral lower limb amputee.  The left recent AKA dressings are intact. Incision/Wound:  Lab Results:  Recent Labs    08/28/18 0435  WBC 9.5  HGB 12.5*  HCT 40.1  PLT 262   BMET Recent Labs    08/28/18 0435  NA 148*  K 3.7  CL 117*  CO2 24  GLUCOSE 260*  BUN 23  CREATININE 1.16  CALCIUM 8.2*   PT/INR Recent Labs    08/28/18 0435  LABPROT 14.8  INR 1.2   ABG No results for input(s): PHART, HCO3 in the last 72 hours.  Invalid input(s): PCO2, PO2  Studies/Results: No results found.  Anti-infectives: Anti-infectives (From admission, onward)   Start     Dose/Rate Route Frequency Ordered Stop   08/24/18 1200  piperacillin-tazobactam (ZOSYN) IVPB 3.375 g  Status:  Discontinued     3.375 g 100 mL/hr over 30 Minutes Intravenous Every 8 hours 08/24/18 1130 08/24/18 1133   08/24/18 1145  piperacillin-tazobactam (ZOSYN) IVPB 3.375 g     3.375 g 12.5 mL/hr over 240 Minutes Intravenous Every 8 hours 08/24/18 1133     08/22/18 0800  cefTRIAXone (ROCEPHIN) 2 g in sodium chloride 0.9 % 100 mL IVPB  Status:  Discontinued     2 g 200  mL/hr over 30 Minutes Intravenous Every 24 hours 08/21/18 2241 08/24/18 1130   08/21/18 1600  vancomycin (VANCOCIN) 1,500 mg in sodium chloride 0.9 % 500 mL IVPB  Status:  Discontinued     1,500 mg 250 mL/hr over 120 Minutes Intravenous Every 24 hours 08/21/18 1200 08/21/18 1219   08/21/18 1600  vancomycin (VANCOCIN) 1,500 mg in sodium chloride 0.9 % 500 mL IVPB  Status:  Discontinued     1,500 mg 250 mL/hr over 120 Minutes Intravenous Every 24 hours 08/21/18 1219 08/24/18 1130   08/21/18 1400  metroNIDAZOLE (FLAGYL) IVPB 500 mg  Status:  Discontinued     500 mg 100 mL/hr over 60 Minutes Intravenous Every 8 hours 08/21/18 1148 08/24/18 1130   08/20/18 2200  ceFEPIme (MAXIPIME) 2 g in sodium chloride 0.9 % 100 mL IVPB  Status:  Discontinued     2 g 200 mL/hr over 30 Minutes Intravenous Every 12 hours 08/20/18 1756 08/21/18 2241   08/20/18 1800  vancomycin (VANCOCIN) IVPB 1000 mg/200 mL premix  Status:  Discontinued     1,000 mg 200 mL/hr over 60 Minutes Intravenous Every 24 hours 08/20/18 1756 08/21/18 1200   08/20/18 1530  vancomycin (VANCOCIN) IVPB 1000 mg/200 mL premix  Status:  Discontinued     1,000 mg 200 mL/hr over 60 Minutes Intravenous Every 2 hours 08/20/18 1334 08/20/18 1754   08/20/18 1330  ceFEPIme (MAXIPIME) 2 g in sodium chloride 0.9 % 100 mL IVPB     2 g 200 mL/hr over 30 Minutes Intravenous  Once 08/20/18 1325 08/20/18 1430   08/20/18 1330  metroNIDAZOLE (FLAGYL) IVPB 500 mg     500 mg 100 mL/hr over 60 Minutes Intravenous  Once 08/20/18 1325 08/20/18 1648   08/20/18 1330  vancomycin (VANCOCIN) IVPB 1000 mg/200 mL premix  Status:  Discontinued     1,000 mg 200 mL/hr over 60 Minutes Intravenous  Once 08/20/18 1325 08/20/18 1334      Assessment/Plan: s/p Procedure(s): AMPUTATION ABOVE KNEE (Left) Advance diet  I will recommend PT to assess and work with.  LOS: 9 days    Elmore Guise 08/29/2018

## 2018-08-29 NOTE — TOC Progression Note (Signed)
Transition of Care [facility]) - Progression Note    Patient Details  Name: Spencer Edwards MRN: [id number] Date of Birth: 1940/05/15  Transition of Care [facility]) CM/SW [place], [place] Phone Number: 08/29/2018, 10:10 AM  Clinical Narrative:   The CSW attempted to contact APS worker Teressa Senter to provide bed offers. The voicemail box was full; the CSW was unable to leave a message.    Expected Discharge Plan: [place] Barriers to Discharge: Continued Medical Work up  Expected Discharge Plan and Services Expected Discharge Plan: [place]   Discharge Planning Services: CM Consult   Living arrangements for the past 2 months: Single Family Home                                       Social Determinants of Health (SDOH) Interventions    Readmission Risk Interventions No flowsheet data found.

## 2018-08-29 NOTE — Progress Notes (Signed)
[place] at [place] NAME: Artis Beggs    MR#:  [id number]  DATE OF BIRTH:  11/08/1940  SUBJECTIVE:   No acute events overnight. s/p Left AKA today.  Eating lunch  REVIEW OF SYSTEMS:    Review of Systems  Constitutional: Negative for chills and fever.  HENT: Negative for congestion and tinnitus.   Eyes: Negative for blurred vision and double vision.  Respiratory: Negative for cough, shortness of breath and wheezing.   Cardiovascular: Negative for chest pain, orthopnea and PND.  Gastrointestinal: Negative for abdominal pain, diarrhea, nausea and vomiting.  Genitourinary: Negative for dysuria and hematuria.  Neurological: Positive for weakness (Generalized weakness. ). Negative for dizziness, sensory change and focal weakness.  All other systems reviewed and are negative.   Nutrition: Dysphagia 1 with nectar thick liquids Tolerating Diet:yes Tolerating PT: Await Eval.   DRUG ALLERGIES:  No Known Allergies  VITALS:  Blood pressure (!) 108/56, pulse 94, temperature 98.6 F (37 C), temperature source Oral, resp. rate 17, height 5\' 10"  (1.778 m), weight 83 kg, SpO2 95 %.  PHYSICAL EXAMINATION:   Physical Exam  GENERAL:  78 y.o.-year-old thin cachectic patient lying in bed in no acute distress.  EYES: Pupils equal, round, reactive to light and accommodation. No scleral icterus. Extraocular muscles intact.  HEENT: Head atraumatic, normocephalic. Oropharynx and nasopharynx clear.  NECK:  Supple, no jugular venous distention. No thyroid enlargement, no tenderness.  LUNGS: Normal breath sounds bilaterally, no wheezing, rales, rhonchi. No use of accessory muscles of respiration.  CARDIOVASCULAR: S1, S2 normal. No murmurs, rubs, or gallops.  ABDOMEN: Soft, nontender, nondistended. Bowel sounds present. No organomegaly or mass.  EXTREMITIES: No cyanosis, clubbing or edema b/l.   Right BKA.  Left lower ext. Wounds/gangrene as shown  below.  NEUROLOGIC: Cranial nerves II through XII are intact. No focal Motor or sensory deficits b/l. Globally weake  PSYCHIATRIC: The patient is alert and oriented x 3.  SKIN: No obvious rash, lesion, Left lower ext. Gangrene as shown below.             LABORATORY PANEL:   CBC Recent Labs  Lab 08/28/18 0435  WBC 9.5  HGB 12.5*  HCT 40.1  PLT 262   ------------------------------------------------------------------------------------------------------------------  Chemistries  Recent Labs  Lab 08/28/18 0435  NA 148*  K 3.7  CL 117*  CO2 24  GLUCOSE 260*  BUN 23  CREATININE 1.16  CALCIUM 8.2*  MG 2.3   ------------------------------------------------------------------------------------------------------------------  Cardiac Enzymes No results for input(s): TROPONINI in the last 168 hours. ------------------------------------------------------------------------------------------------------------------  RADIOLOGY:  No results found.   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  -PT to be started   *  Morganella & Coag. Neg staph bacteremia  -Continue Zosyn (total 10 days) -repeat BC negative in 2 days  * Acute frontal CVA - cont. ASA Status post neurology evaluation Carotid ultrasound showed bilateral minimal atherosclerotic disease Echocardiogram showing EF of 35 to 40% with no evidence of thrombus. - cont. PT as tolerated.   * Elevated troponin -Suspected to be supply demand ischemia from  sepsis.  Troponins were mildly elevated and  seen by cardiology and echo showing reduced systolic function of EF of 35 to 40%.  No acute cardiac intervention presently. -Continue medical management.  * Acute rhabdomyolysis improved and resolved with IV fluids.  Continue to hold statin medication  * Acute hypernatremia - resolved with D5W * Uncontrolled diabetes mellitus -Blood sugars stable, patient's Lantus was held this morning as patient had surgery today.  Continue sliding scale insulin for now.  Follow blood sugars closely.  * GI bleed-resolved.  Hemoglobin stable. -Seen by gastroenterology and no plans for acute intervention given patient's multiple comorbidities.  Continue po Protonix.  * Acute renal insufficiency - resolved w/ fluids and will cont. To monitor.  - renal dose meds and avoid nephrotoxins.   * Decubitus ulcer infected Wound care consult appreciated Surgery consult done, no debridement recommended - cont. abx as mentioned above.   * BPH - cont. Flomax.   Pt. Will likely need SNF/STR upon discharge.   All the records are reviewed and case discussed with Care Management/Social Worker. Management plans discussed with the patient  CODE STATUS: DNR  TOTAL TIME TAKING CARE OF THIS PATIENT: 26 minutes.   POSSIBLE D/C IN 2-3 DAYS, DEPENDING ON CLINICAL CONDITION.   Enedina FinnerSona Jacquese Cassarino M.D on 08/29/2018 at 1:14 PM  Between 7am to 6pm - Pager - [phone]  After 6pm go to [email or web address] - Social research officer, [email or web address] [email or web address]  [employer] [facility]  Hospitalists  Office  [phone]  CC: Primary care physician; Patient, No Pcp Per

## 2018-08-30 LAB — GLUCOSE, CAPILLARY
Glucose-Capillary: 214 mg/dL — ABNORMAL HIGH (ref 70–99)
Glucose-Capillary: 228 mg/dL — ABNORMAL HIGH (ref 70–99)
Glucose-Capillary: 190 mg/dL — ABNORMAL HIGH (ref 70–99)

## 2018-08-30 NOTE — Progress Notes (Signed)
2 Days Post-Op   Subjective/Chief Complaint: Patient is comfortable with expected left leg pain.  He has had PT and is doing well overall.    Objective: Vital signs in last 24 hours: Temp:  [98.4 F (36.9 C)-98.7 F (37.1 C)] 98.7 F (37.1 C) (07/19 0742) Pulse Rate:  [92-94] 92 (07/19 0742) Resp:  [17-18] 17 (07/19 0742) BP: (93-108)/(52-69) 103/59 (07/19 0742) SpO2:  [95 %-97 %] 97 % (07/19 0742) Last BM Date: 08/30/18  Intake/Output from previous day: 07/18 0701 - 07/19 0700 In: 2013.4 [P.O.:440; I.V.:1472.9; IV Piggyback:100.5] Out: -  Intake/Output this shift: Total I/O In: 300 [P.O.:300] Out: -   General appearance: alert and cooperative Head: Normocephalic, without obvious abnormality, atraumatic Extremities: left leg dressing intact Neurologic: Alert and oriented X 3, normal strength and tone. Normal symmetric reflexes. Normal coordination and gait Incision/Wound:  Lab Results:  Recent Labs    08/28/18 0435  WBC 9.5  HGB 12.5*  HCT 40.1  PLT 262   BMET Recent Labs    08/28/18 0435  NA 148*  K 3.7  CL 117*  CO2 24  GLUCOSE 260*  BUN 23  CREATININE 1.16  CALCIUM 8.2*   PT/INR Recent Labs    08/28/18 0435  LABPROT 14.8  INR 1.2   ABG No results for input(s): PHART, HCO3 in the last 72 hours.  Invalid input(s): PCO2, PO2  Studies/Results: No results found.  Anti-infectives: Anti-infectives (From admission, onward)   Start     Dose/Rate Route Frequency Ordered Stop   08/24/18 1200  piperacillin-tazobactam (ZOSYN) IVPB 3.375 g  Status:  Discontinued     3.375 g 100 mL/hr over 30 Minutes Intravenous Every 8 hours 08/24/18 1130 08/24/18 1133   08/24/18 1145  piperacillin-tazobactam (ZOSYN) IVPB 3.375 g     3.375 g 12.5 mL/hr over 240 Minutes Intravenous Every 8 hours 08/24/18 1133 2018-04-26 1359   08/22/18 0800  cefTRIAXone (ROCEPHIN) 2 g in sodium chloride 0.9 % 100 mL IVPB  Status:  Discontinued     2 g 200 mL/hr over 30 Minutes  Intravenous Every 24 hours 08/21/18 2241 08/24/18 1130   08/21/18 1600  vancomycin (VANCOCIN) 1,500 mg in sodium chloride 0.9 % 500 mL IVPB  Status:  Discontinued     1,500 mg 250 mL/hr over 120 Minutes Intravenous Every 24 hours 08/21/18 1200 08/21/18 1219   08/21/18 1600  vancomycin (VANCOCIN) 1,500 mg in sodium chloride 0.9 % 500 mL IVPB  Status:  Discontinued     1,500 mg 250 mL/hr over 120 Minutes Intravenous Every 24 hours 08/21/18 1219 08/24/18 1130   08/21/18 1400  metroNIDAZOLE (FLAGYL) IVPB 500 mg  Status:  Discontinued     500 mg 100 mL/hr over 60 Minutes Intravenous Every 8 hours 08/21/18 1148 08/24/18 1130   08/20/18 2200  ceFEPIme (MAXIPIME) 2 g in sodium chloride 0.9 % 100 mL IVPB  Status:  Discontinued     2 g 200 mL/hr over 30 Minutes Intravenous Every 12 hours 08/20/18 1756 08/21/18 2241   08/20/18 1800  vancomycin (VANCOCIN) IVPB 1000 mg/200 mL premix  Status:  Discontinued     1,000 mg 200 mL/hr over 60 Minutes Intravenous Every 24 hours 08/20/18 1756 08/21/18 1200   08/20/18 1530  vancomycin (VANCOCIN) IVPB 1000 mg/200 mL premix  Status:  Discontinued     1,000 mg 200 mL/hr over 60 Minutes Intravenous Every 2 hours 08/20/18 1334 08/20/18 1754   08/20/18 1330  ceFEPIme (MAXIPIME) 2 g in sodium chloride 0.9 %  100 mL IVPB     2 g 200 mL/hr over 30 Minutes Intravenous  Once 08/20/18 1325 08/20/18 1430   08/20/18 1330  metroNIDAZOLE (FLAGYL) IVPB 500 mg     500 mg 100 mL/hr over 60 Minutes Intravenous  Once 08/20/18 1325 08/20/18 1648   08/20/18 1330  vancomycin (VANCOCIN) IVPB 1000 mg/200 mL premix  Status:  Discontinued     1,000 mg 200 mL/hr over 60 Minutes Intravenous  Once 08/20/18 1325 08/20/18 1334      Assessment/Plan: s/p Procedure(s): AMPUTATION ABOVE KNEE (Left) Plan for discharge tomorrow will need skilled nursing facility   LOS: 10 days    Spencer Edwards 08/30/2018

## 2018-08-30 NOTE — Progress Notes (Signed)
Patient has had uneventful day. Resting in bed this shift.  Left stump elevated on pillow. Tegaderm wrap to stump in place.   No c/o discomfort.   Patient has had good appetite this shift, full assist with meals.

## 2018-08-30 NOTE — Progress Notes (Signed)
[employer] [facility] - [place] at [facility]     PATIENT NAME: Spencer BumpsRichard Edwards    MR#:  [id number]  DATE OF BIRTH:  05/14/1940  SUBJECTIVE:   No acute events overnight. s/p Left AKA POD #2   REVIEW OF SYSTEMS:    Review of Systems  Constitutional: Negative for chills and fever.  HENT: Negative for congestion and tinnitus.   Eyes: Negative for blurred vision and double vision.  Respiratory: Negative for cough, shortness of breath and wheezing.   Cardiovascular: Negative for chest pain, orthopnea and PND.  Gastrointestinal: Negative for abdominal pain, diarrhea, nausea and vomiting.  Genitourinary: Negative for dysuria and hematuria.  Neurological: Positive for weakness (Generalized weakness. ). Negative for dizziness, sensory change and focal weakness.  All other systems reviewed and are negative.   Nutrition: Dysphagia 1 with nectar thick liquids Tolerating Diet:yes Tolerating PT: SNF  DRUG ALLERGIES:  No Known Allergies  VITALS:  Blood pressure (!) 103/59, pulse 92, temperature 98.7 F (37.1 C), temperature source Oral, resp. rate 17, height 5\' 10"  (1.778 m), weight 83 kg, SpO2 97 %.  PHYSICAL EXAMINATION:   Physical Exam  GENERAL:  78 y.o.-year-old thin cachectic patient lying in bed in no acute distress.  EYES: Pupils equal, round, reactive to light and accommodation. No scleral icterus. Extraocular muscles intact.  HEENT: Head atraumatic, normocephalic. Oropharynx and nasopharynx clear.  NECK:  Supple, no jugular venous distention. No thyroid enlargement, no tenderness.  LUNGS: Normal breath sounds bilaterally, no wheezing, rales, rhonchi. No use of accessory muscles of respiration.  CARDIOVASCULAR: S1, S2 normal. No murmurs, rubs, or gallops.  ABDOMEN: Soft, nontender, nondistended. Bowel sounds present. No organomegaly or mass.  EXTREMITIES: Bilateral LE amputation NEUROLOGIC: Cranial nerves II through XII are intact. No focal Motor or sensory deficits  b/l. Globally weak  PSYCHIATRIC: The patient is alert and oriented x 2.  SKIN: No obvious rash, lesion            LABORATORY PANEL:   CBC Recent Labs  Lab 08/28/18 0435  WBC 9.5  HGB 12.5*  HCT 40.1  PLT 262   ------------------------------------------------------------------------------------------------------------------  Chemistries  Recent Labs  Lab 08/28/18 0435  NA 148*  K 3.7  CL 117*  CO2 24  GLUCOSE 260*  BUN 23  CREATININE 1.16  CALCIUM 8.2*  MG 2.3   ------------------------------------------------------------------------------------------------------------------  Cardiac Enzymes No results for input(s): TROPONINI in the last 168 hours. ------------------------------------------------------------------------------------------------------------------  RADIOLOGY:  No results found.   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  -PT initiated  *  Morganella & Coag. Neg staph bacteremia  -Continue Zosyn (total 10 days) -repeat BC negative in 2 days  * Acute frontal CVA - cont. ASA Status post neurology evaluation Carotid ultrasound showed bilateral minimal atherosclerotic disease Echocardiogram showing EF of 35 to 40% with no evidence of thrombus. - cont. PT as tolerated.   * Elevated troponin -Suspected to be supply demand ischemia from sepsis.  Troponins were mildly elevated and seen by cardiology and echo showing reduced systolic function of EF of 35 to 40%.  No acute cardiac intervention presently. -Continue medical  management.  * Acute rhabdomyolysis improved and resolved with IV fluids.  Continue to hold statin medication  * Acute hypernatremia - resolved with D5W * Uncontrolled diabetes mellitus -Blood sugars stable, patient's Lantus was held this morning as patient had surgery today.  Continue sliding scale insulin for now.  Follow blood sugars closely.  * GI bleed-resolved.  Hemoglobin stable. -Seen by gastroenterology and no plans for acute intervention given patient's multiple comorbidities.  - Continue po Protonix.  * Acute renal insufficiency - resolved w/ fluids and will cont. To monitor.  - renal dose meds and avoid nephrotoxins.   * Decubitus ulcer infected -Wound care consult appreciated -Surgery consult done, no debridement recommended - cont. abx as mentioned above.   * BPH - cont. Flomax.   Pt. Will likely need SNF/STR upon discharge.   All the records are reviewed and case discussed with Care Management/Social Worker. Management plans discussed with the patient  CODE STATUS: DNR  TOTAL TIME TAKING CARE OF THIS PATIENT: 26 minutes.   Discharge once social worker is able to get in touch with patient's APS worker when bed offers presented.  Fritzi Mandes M.D on 08/30/2018 at 12:32 PM  Between 7am to 6pm - Pager - [phone]  After 6pm go to [email or web address] - Proofreader  [facility] [place] [facility]  Office  [phone]  CC: Primary care physician; Patient, No Pcp Per

## 2018-08-30 NOTE — Evaluation (Signed)
Physical Therapy Evaluation Patient Details Name: Spencer BrunnerRichard T Edwards MRN: [id number] DOB: 06/27/1940 Today's Date: 08/30/2018   History of Present Illness  78 y.o. male with multiple medical problems including diabetes, CAD, and PVD status post right BKA.  Patient was admitted to the hospital on 08/20/2018 with sepsis from lower extremity infection.  Patient apparently was found at home on the floor after lying there for several days.  Patient is currently being medically managed for sepsis but there is concerned that he may need future amputation. Subsequent MRI reveals 4mm right posteriorial left frontal infarct.  Clinical Impression  Pt was seen for mobility and strength with difficulty managing his trunk weakness to sit side of bed.  Sitting balance control was too limited for a sliding transfer and so will recommend him to rehab.  While the presence of a prosthesis would increase sitting control, it is unlikely that he will benefit from it enough to be independent enough to go home with sporadic family help.  Follow acutely for progression of balance and strength as tolerated.    Follow Up Recommendations SNF    Equipment Recommendations  None recommended by PT    Recommendations for Other Services       Precautions / Restrictions Precautions Precautions: Fall Precaution Comments: does not have RLE prosthesis with him Required Braces or Orthoses: Other Brace(RLE BK prosthesis) Restrictions Weight Bearing Restrictions: Yes RLE Weight Bearing: Weight bearing as tolerated(with prosthesis) Other Position/Activity Restrictions: NWB L LE-new AK      Mobility  Bed Mobility Overal bed mobility: Needs Assistance Bed Mobility: Supine to Sit;Sit to Supine;Rolling Rolling: Mod assist   Supine to sit: Max assist Sit to supine: Max assist   General bed mobility comments: pt uses bedrail  Transfers Overall transfer level: Needs assistance               General transfer comment:  pt was not able to balance sitting well enough to do a sliding transfer  Ambulation/Gait             General Gait Details: no RLE prosthesis to attempt  Stairs            Wheelchair Mobility    Modified Rankin (Stroke Patients Only)       Balance Overall balance assessment: History of Falls;Needs assistance Sitting-balance support: Bilateral upper extremity supported(PT assisting trunk) Sitting balance-Leahy Scale: Poor Sitting balance - Comments: tends to lean onto R elbow                                     Pertinent Vitals/Pain Pain Assessment: Faces Faces Pain Scale: Hurts even more Pain Location: L stump Pain Descriptors / Indicators: Operative site guarding Pain Intervention(s): Monitored during session;Premedicated before session;Repositioned    Home Living Family/patient expects to be discharged to:: Private residence Living Arrangements: Alone Available Help at Discharge: Family;Friend(s);Available PRN/intermittently Type of Home: House Home Access: Ramped entrance     Home Layout: One level Home Equipment: Walker - 2 wheels;Cane - single point;Grab bars - tub/shower;Grab bars - toilet;Shower seat;Wheelchair - manual Additional Comments: used WC and prosthesis to do sitting rolling trasport in the home    Prior Function Level of Independence: Needs assistance   Gait / Transfers Assistance Needed: [facility] RLE  ADL's / Homemaking Assistance Needed: mod indep with basic ADL tasks, "RN friend" assists with groceries and medication mgt, receives Meals on  Wheels (typically daily but since pandemic started has only been coming 1x/wk)        Hand Dominance   Dominant Hand: Right    Extremity/Trunk Assessment   Upper Extremity Assessment Upper Extremity Assessment: Generalized weakness    Lower Extremity Assessment Lower Extremity Assessment: Generalized weakness;LLE deficits/detail LLE Deficits / Details:  new AK with pain, edema    Cervical / Trunk Assessment Cervical / Trunk Assessment: Normal  Communication   Communication: HOH  Cognition Arousal/Alertness: Lethargic Behavior During Therapy: Flat affect Overall Cognitive Status: Within Functional Limits for tasks assessed                                 General Comments: sleepy today      General Comments General comments (skin integrity, edema, etc.): Pt talked with PT about his PLOF and note his prosthesis might be helpful to balance and control transfers, but cannot sit without mod assist on side of bed    Exercises     Assessment/Plan    PT Assessment Patient needs continued PT services  PT Problem List Decreased strength;Decreased range of motion;Decreased activity tolerance;Decreased balance;Decreased mobility;Decreased coordination;Decreased knowledge of use of DME;Decreased safety awareness;Decreased skin integrity;Pain       PT Treatment Interventions DME instruction;Functional mobility training;Therapeutic activities;Therapeutic exercise;Balance training;Neuromuscular re-education;Patient/family education    PT Goals (Current goals can be found in the Care Plan section)  Acute Rehab PT Goals Patient Stated Goal: to get stronger and get home PT Goal Formulation: With patient Time For Goal Achievement: 09/13/18 Potential to Achieve Goals: Good    Frequency 7X/week   Barriers to discharge Decreased caregiver support needs help to just sit up and has sporadic home help    Co-evaluation               AM-PAC PT "6 Clicks" Mobility  Outcome Measure Help needed turning from your back to your side while in a flat bed without using bedrails?: A Lot Help needed moving from lying on your back to sitting on the side of a flat bed without using bedrails?: A Lot Help needed moving to and from a bed to a chair (including a wheelchair)?: A Lot Help needed standing up from a chair using your arms (e.g.,  wheelchair or bedside chair)?: Total Help needed to walk in hospital room?: Total Help needed climbing 3-5 steps with a railing? : Total 6 Click Score: 9    End of Session   Activity Tolerance: Patient limited by fatigue;Patient limited by pain Patient left: in bed;with call bell/phone within reach;with bed alarm set Nurse Communication: Mobility status;Other (comment)(contacted MD and nursing about dc) PT Visit Diagnosis: Muscle weakness (generalized) (M62.81);Other abnormalities of gait and mobility (R26.89);Pain Pain - Right/Left: Left Pain - part of body: Leg    Time: 0932-6712 PT Time Calculation (min) (ACUTE ONLY): 25 min   Charges:   PT Evaluation $PT Eval Moderate Complexity: 1 Mod PT Treatments $Therapeutic Activity: 8-22 mins       Ramond Dial 08/30/2018, 10:26 AM   Mee Hives, PT MS Acute Rehab Dept. Number: [place] [facility] [place]

## 2018-08-31 ENCOUNTER — Inpatient Hospital Stay: Payer: Medicare Other

## 2018-08-31 ENCOUNTER — Encounter: Payer: Self-pay | Admitting: Vascular Surgery

## 2018-08-31 DIAGNOSIS — Z89612 Acquired absence of left leg above knee: Secondary | ICD-10-CM

## 2018-08-31 LAB — GLUCOSE, CAPILLARY
Glucose-Capillary: 243 mg/dL — ABNORMAL HIGH (ref 70–99)
Glucose-Capillary: 286 mg/dL — ABNORMAL HIGH (ref 70–99)
Glucose-Capillary: 230 mg/dL — ABNORMAL HIGH (ref 70–99)
Glucose-Capillary: 147 mg/dL — ABNORMAL HIGH (ref 70–99)

## 2018-08-31 LAB — ABO/RH: ABO/RH(D): O POS

## 2018-08-31 LAB — SARS CORONAVIRUS 2 BY RT PCR (HOSPITAL ORDER, PERFORMED IN ~~LOC~~ HOSPITAL LAB): SARS Coronavirus 2: NEGATIVE

## 2018-08-31 MED ORDER — METRONIDAZOLE 500 MG PO TABS: 500.0000 mg | ORAL_TABLET | Freq: Three times a day (TID) | ORAL | 0 refills | Status: AC

## 2018-08-31 MED ORDER — CIPROFLOXACIN HCL 500 MG PO TABS: 500.0000 mg | ORAL_TABLET | Freq: Two times a day (BID) | ORAL | 0 refills | Status: AC

## 2018-08-31 MED ORDER — METRONIDAZOLE 500 MG PO TABS
500.0000 mg | ORAL_TABLET | Freq: Three times a day (TID) | ORAL | Status: DC
  Administered 2018-08-31: 500 mg via ORAL
  Filled 2018-08-31: qty 1

## 2018-08-31 MED ORDER — OXYCODONE-ACETAMINOPHEN 5-325 MG PO TABS: 1.0000 | ORAL_TABLET | ORAL | 0 refills | Status: AC | PRN

## 2018-08-31 MED ORDER — CIPROFLOXACIN HCL 500 MG PO TABS
500.0000 mg | ORAL_TABLET | Freq: Two times a day (BID) | ORAL | Status: DC
  Filled 2018-08-31 (×2): qty 1

## 2018-08-31 MED ORDER — PANTOPRAZOLE SODIUM 40 MG PO TBEC: 40.0000 mg | DELAYED_RELEASE_TABLET | Freq: Every day | ORAL | 0 refills | Status: AC

## 2018-08-31 MED ORDER — INSULIN ASPART 100 UNIT/ML ~~LOC~~ SOLN: 7.0000 [IU] | Freq: Three times a day (TID) | SUBCUTANEOUS | 11 refills | Status: AC

## 2018-08-31 NOTE — Discharge Instructions (Signed)
Vascular Surgery Discharge Instructions: Patient may shower as of 09/07/18. Please sponge bath before then. Cover left AKA stump with Kerlix on a daily basis or sooner if drainage is noted.  Elevate stump when not working with PT etc

## 2018-08-31 NOTE — Progress Notes (Signed)
[facility] Daily Progress Note   Subjective: 3 Days Post-Op:  Left above-the-knee amputation  Continued left stump pain. No issues overnight. Asking about discharge and how long his stay at rehab will be.   Objective: Vitals:   08/30/18 0742 08/30/18 1521 08/30/18 2305 08/31/18 0752  BP: (!) 103/59 106/64 (!) 102/59 (!) 100/59  Pulse: 92 94 95 100  Resp: 17 17 19    Temp: 98.7 F (37.1 C) 98 F (36.7 C) 99.3 F (37.4 C) 99.2 F (37.3 C)  TempSrc: Oral Oral Oral Oral  SpO2: 97% 95% 98% 97%  Weight:      Height:        Intake/Output Summary (Last 24 hours) at 08/31/2018 1203 Last data filed at 08/31/2018 0900 Gross per 24 hour  Intake 360 ml  Output -  Net 360 ml   Physical Exam: A&Ox3, NAD CV: RRR Pulmonary: CTA Bilaterally Abdomen: Soft, Nontender, Nondistended Vascular:  Left Lower Extremity: OR dressing removed. Thigh soft. Incision: staples are clean, dry and intact. New dressing applied.   Document Information Photos    08/31/2018 11:37  Attached To:  Hospital Encounter on 08/20/18  Source Information Talbert Trembath, Janalyn Harder, PA-C  [facility] (1a)    Laboratory: CBC    Component Value Date/Time   WBC 9.5 08/28/2018 0435   HGB 12.5 (L) 08/28/2018 0435   HGB 12.7 (L) 01/10/2014 0456   HCT 40.1 08/28/2018 0435   HCT 39.1 (L) 01/10/2014 0456   PLT 262 08/28/2018 0435   PLT 232 01/10/2014 0456   BMET    Component Value Date/Time   NA 148 (H) 08/28/2018 0435   NA 145 01/14/2014 0521   K 3.7 08/28/2018 0435   K 3.8 01/14/2014 0521   CL 117 (H) 08/28/2018 0435   CL 107 01/14/2014 0521   CO2 24 08/28/2018 0435   CO2 31 01/14/2014 0521   GLUCOSE 260 (H) 08/28/2018 0435   GLUCOSE 67 01/14/2014 0521   BUN 23 08/28/2018 0435   BUN 29 (H) 01/14/2014 0521   CREATININE 1.16 08/28/2018 0435   CREATININE 1.36 (H) 01/14/2014 0521   CALCIUM 8.2 (L) 08/28/2018 0435   CALCIUM 8.2 (L) 01/14/2014 0521   GFRNONAA 60 (L) 08/28/2018  0435   GFRNONAA 54 (L) 01/14/2014 0521   GFRNONAA >60 07/15/2013 0809   GFRAA >60 08/28/2018 0435   GFRAA >60 01/14/2014 0521   GFRAA >60 07/15/2013 0809   Assessment/Planning: The patient is a 78 year old male s/p left AKA - POD #3 1) OR dressing removed. Stump healing well. See above picture. 2) OK to discharge from vascular standpoint when medically stable. 3) To follow up in outpatient setting - will enter dressing / follow up info in discharge section.  Discussed with Dr. Eber Hong Aadam Zhen PA-C 08/31/2018 12:03 PM

## 2018-08-31 NOTE — Care Management Important Message (Signed)
Important Message  Patient Details  Name: SMILEY BIRR MRN: [id number] Date of Birth: 1940/12/10   Medicare Important Message Given:  Other (see comment)  Patient is not in the room so unable to obtain signature for Important Message from Medicare.  Will try again.   Juliann Pulse A Tierra Thoma 08/31/2018, 2:05 PM

## 2018-08-31 NOTE — Care Management Important Message (Signed)
Important Message  Patient Details  Name: Spencer Edwards MRN: [id number] Date of Birth: 12-27-1940   Medicare Important Message Given:  Yes     Dannette Barbara 08/31/2018, 4:15 PM

## 2018-08-31 NOTE — Discharge Summary (Signed)
[facility] NAME: Spencer Edwards    MR#:  [id number]  DATE OF BIRTH:  [birth year]  DATE OF ADMISSION:  08/20/2018 ADMITTING PHYSICIAN: Loletha Grayer, MD  DATE OF DISCHARGE: 08/31/2018  PRIMARY CARE PHYSICIAN: Patient, No Pcp Per    ADMISSION DIAGNOSIS:  Elevated troponin I level [R79.89] Diabetic foot infection (HCC) [I96.789, L08.9] Non-traumatic rhabdomyolysis [M62.82] Sepsis with acute organ dysfunction without septic shock, due to unspecified organism, unspecified type ([place]) [A41.9, R65.20]  DISCHARGE DIAGNOSIS:  sepsis due to gangrene left lower extremity status post left elbow knee amputation SECONDARY DIAGNOSIS:   Past Medical History:  Diagnosis Date  . Acute kidney failure ([place])   . Anxiety disorder   . Coronary artery disease    Non-ST elevation myocardial infarction in November 2015 in the setting of septic shock and multiorgan failure. Peak troponin was 25. Echo showed normal LV systolic function with inferior and posterior hypokinesis.  . Diabetes mellitus without complication ([place])   . Dysphagia   . Gastro-esophageal reflux   . Hematuria   . Hyperlipidemia   . Hypernatremia   . Hypertension   . MI (myocardial infarction) ([place])   . Muscle weakness   . PVD (peripheral vascular disease) ([place])   . Rhabdomyolysis   . Urinary retention     HOSPITAL COURSE:   78 year old male patient with history of diabetes mellitus type 2, coronary disease, non-STEMI,, diabetes mellitus type 2, GERD, hyperlipidemia, hypertension, peripheral vascular disease, rhabdomyolysis currently under hospitalist service  * Sepsis secondary to left leg infectionand Gangrene now s/p left AKA on 08/28/2018 POD # 3 -on  IV  Zosyn cont for total 10 days (last day 7/23)--will change to po abx for next 3 days -Seen by infectious disease and  vascular surgery.  -PT initiated--Rehab  *  Morganella & Coag. Neg staph bacteremia   -Continue Zosyn (total 10 days) now change to po abx cipro and flagyl -repeat BC negative in 2 days  * Acute frontal CVA - cont. ASA Status post neurology evaluation Carotid ultrasound showed bilateral minimal atherosclerotic disease Echocardiogram showing EF of 35 to 40% with no evidence of thrombus. - cont. PT as tolerated.   * Elevated troponin -Suspected to be supply demand ischemia from sepsis.  Troponins were mildly elevated and seen by cardiology and echo showing reduced systolic function of EF of 35 to 40%.  No acute cardiac intervention presently. -Continue medical management.  * Acute rhabdomyolysis improved and resolved with IV fluids.  Resumed statins at discharge  * Uncontrolled diabetes mellitus -Blood sugars stable, patient's Lantus was held this morning as patient had surgery today.  Continue sliding scale insulin for now.  Follow blood sugars closely.  * GI bleed-resolved.  Hemoglobin stable. -Seen by gastroenterology and no plans for acute intervention given patient's multiple comorbidities.  - Continue po Protonix.  * Acute renal insufficiency - resolved w/ fluids and will cont. To monitor.  - renal dose meds and avoid nephrotoxins.   * Decubitus ulcer infected -Wound care consultappreciated -Surgery consultdone, no debridement recommended - cont. abx as mentioned above.   * BPH - cont. Flomax.   *pt Is at a high risk of aspiration please follow dysphagia one nectar thick fluids for diet. CONSULTS OBTAINED:  Treatment Team:  Algernon Huxley, MD Yolonda Kida, MD Tsosie Billing, MD Clapacs, Madie Reno, MD  DRUG ALLERGIES:  No Known Allergies  DISCHARGE MEDICATIONS:   Allergies as of 08/31/2018  No Known Allergies     Medication List    STOP taking these medications   sulfamethoxazole-trimethoprim 800-160 MG tablet Commonly known as: BACTRIM DS     TAKE these medications   ALPRAZolam 0.25 MG tablet Commonly known as:  XANAX Take 0.25 mg by mouth 3 (three) times daily.   aspirin EC 81 MG tablet Take 81 mg by mouth daily.   atorvastatin 80 MG tablet Commonly known as: LIPITOR Take 80 mg by mouth daily.   bisacodyl 10 MG suppository Commonly known as: DULCOLAX Place 10 mg rectally as needed for moderate constipation.   ciprofloxacin 500 MG tablet Commonly known as: CIPRO Take 1 tablet (500 mg total) by mouth 2 (two) times daily for 4 days.   clopidogrel 75 MG tablet Commonly known as: PLAVIX Take 75 mg by mouth daily.   famotidine 20 MG tablet Commonly known as: PEPCID Take 20 mg by mouth daily.   feeding supplement (PRO-STAT SUGAR FREE 64) Liqd Take 30 mLs by mouth 2 (two) times daily.   insulin aspart 100 UNIT/ML injection Commonly known as: novoLOG Inject 7 Units into the skin 3 (three) times daily with meals.   insulin glargine 100 UNIT/ML injection Commonly known as: LANTUS Inject 24 Units into the skin at bedtime.   ipratropium-albuterol 0.5-2.5 (3) MG/3ML Soln Commonly known as: DUONEB Take 3 mLs by nebulization every 4 (four) hours as needed.   metoprolol tartrate 25 MG tablet Commonly known as: LOPRESSOR Take 12.5 mg by mouth 2 (two) times daily.   metroNIDAZOLE 500 MG tablet Commonly known as: FLAGYL Take 1 tablet (500 mg total) by mouth every 8 (eight) hours for 4 days.   oxyCODONE-acetaminophen 5-325 MG tablet Commonly known as: PERCOCET/ROXICET Take 1-2 tablets by mouth every 4 (four) hours as needed for moderate pain or severe pain.   pantoprazole 40 MG tablet Commonly known as: PROTONIX Take 1 tablet (40 mg total) by mouth daily. Start taking on: September 01, 2018   promethazine 25 MG tablet Commonly known as: PHENERGAN Take 25 mg by mouth every 8 (eight) hours as needed for nausea or vomiting.   sennosides 8.8 MG/5ML syrup Commonly known as: SENOKOT Take 5 mLs by mouth every 12 (twelve) hours.   tamsulosin 0.4 MG Caps capsule Commonly known as:  FLOMAX Take 0.4 mg by mouth daily.       If you experience worsening of your admission symptoms, develop shortness of breath, life threatening emergency, suicidal or homicidal thoughts you must seek medical attention immediately by calling 911 or calling your MD immediately  if symptoms less severe.  You Must read complete instructions/literature along with all the possible adverse reactions/side effects for all the Medicines you take and that have been prescribed to you. Take any new Medicines after you have completely understood and accept all the possible adverse reactions/side effects.   Please note  You were cared for by a hospitalist during your hospital stay. If you have any questions about your discharge medications or the care you received while you were in the hospital after you are discharged, you can call the unit and asked to speak with the hospitalist on call if the hospitalist that took care of you is not available. Once you are discharged, your primary care physician will handle any further medical issues. Please note that NO REFILLS for any discharge medications will be authorized once you are discharged, as it is imperative that you return to your primary care physician (or establish a relationship with  a primary care physician if you do not have one) for your aftercare needs so that they can reassess your need for medications and monitor your lab values.  DATA REVIEW:   CBC  Recent Labs  Lab 08/28/18 0435  WBC 9.5  HGB 12.5*  HCT 40.1  PLT 262    Chemistries  Recent Labs  Lab 08/28/18 0435  NA 148*  K 3.7  CL 117*  CO2 24  GLUCOSE 260*  BUN 23  CREATININE 1.16  CALCIUM 8.2*  MG 2.3    Microbiology Results   Recent Results (from the past 240 hour(s))  Aerobic Culture (superficial specimen)     Status: Abnormal   Collection Time: 08/22/18 10:30 AM   Specimen: Leg; Wound  Result Value Ref Range Status   Specimen Description LEG LEFT  Final   Special  Requests NONE  Final   Gram Stain   Final    RARE WBC PRESENT, PREDOMINANTLY MONONUCLEAR NO ORGANISMS SEEN Performed at [facility], [address] [place], [place] [address]    Culture MULTIPLE ORGANISMS PRESENT, NONE PREDOMINANT (A)  Final   Report Status 08/24/2018 FINAL  Final  CULTURE, BLOOD (ROUTINE X 2) w Reflex to ID Panel     Status: None (Preliminary result)   Collection Time: 08/27/18 12:34 AM   Specimen: BLOOD  Result Value Ref Range Status   Specimen Description BLOOD LEFT ANTECUBITAL  Final   Special Requests   Final    BOTTLES DRAWN AEROBIC AND ANAEROBIC Blood Culture results may not be optimal due to an excessive volume of blood received in culture bottles   Culture   Final    NO GROWTH 4 DAYS Performed at [facility], [address] [place], [place] [address]    Report Status PENDING  Incomplete  CULTURE, BLOOD (ROUTINE X 2) w Reflex to ID Panel     Status: None (Preliminary result)   Collection Time: 08/27/18 12:42 AM   Specimen: BLOOD  Result Value Ref Range Status   Specimen Description BLOOD BLOOD LEFT HAND  Final   Special Requests   Final    BOTTLES DRAWN AEROBIC ONLY Blood Culture adequate volume   Culture   Final    NO GROWTH 4 DAYS Performed at [facility], [address] [place], [place] [address]    Report Status PENDING  Incomplete  Surgical PCR screen     Status: Abnormal   Collection Time: 08/28/18  5:11 AM   Specimen: Nasal Mucosa; Nasal Swab  Result Value Ref Range Status   MRSA, PCR POSITIVE (A) NEGATIVE Final    Comment: RESULT CALLED TO, READ BACK BY AND VERIFIED WITH:  ANNA RODRIGUEZ AT 0717 08/28/2018 SDR    Staphylococcus aureus POSITIVE (A) NEGATIVE Final    Comment: (NOTE) The Xpert SA Assay (FDA approved for NASAL specimens in patients 79 years of age and older), is one component of a comprehensive surveillance program. It is not intended to diagnose infection nor to guide or monitor  treatment. Performed at [facility], [address] [place], [place] [address]     RADIOLOGY:  No results found.   CODE STATUS:     Code Status Orders  (From admission, onward)         Start     Ordered   08/20/18 1728  Do not attempt resuscitation (DNR)  Continuous    Question Answer Comment  In the event of cardiac or respiratory ARREST Do not  call a "code blue"   In the event of cardiac or respiratory ARREST Do not perform Intubation, CPR, defibrillation or ACLS   In the event of cardiac or respiratory ARREST Use medication by any route, position, wound care, and other measures to relive pain and suffering. May use oxygen, suction and manual treatment of airway obstruction as needed for comfort.   Comments nurse may pronounce      08/20/18 1728        Code Status History    This patient has a current code status but no historical code status.   Advance Care Planning Activity      TOTAL TIME TAKING CARE OF THIS PATIENT: *40* minutes.    Enedina FinnerSona Vera Wishart M.D on 08/31/2018 at 1:56 PM  Between 7am to 6pm - Pager - [phone] After 6pm go to [email or web address] - password [email or web address]  [facility]  Office  [phone]  CC: Primary care physician; Patient, No Pcp Per

## 2018-08-31 NOTE — Anesthesia Preprocedure Evaluation (Addendum)
Anesthesia Evaluation  Patient identified by MRN, date of birth, ID band Patient awake    Reviewed: Allergy & Precautions, NPO status , Patient's Chart, lab work & pertinent test results, reviewed documented beta blocker date and time   History of Anesthesia Complications (+) PONV  Airway Mallampati: II  TM Distance: >3 FB     Dental  (+) Chipped   Pulmonary former smoker,           Cardiovascular hypertension, Pt. on medications + CAD, + Past MI and + Peripheral Vascular Disease       Neuro/Psych PSYCHIATRIC DISORDERS Anxiety  Neuromuscular disease    GI/Hepatic GERD  ,  Endo/Other  diabetes, Type 2  Renal/GU Renal disease     Musculoskeletal   Abdominal   Peds  Hematology   Anesthesia Other Findings   Reproductive/Obstetrics                            Anesthesia Physical Anesthesia Plan  ASA: III  Anesthesia Plan: General/Spinal   Post-op Pain Management:    Induction:   PONV Risk Score and Plan:   Airway Management Planned:   Additional Equipment:   Intra-op Plan:   Post-operative Plan:   Informed Consent:   Plan Discussed with:   Anesthesia Plan Comments:         Anesthesia Quick Evaluation

## 2018-08-31 NOTE — TOC Progression Note (Signed)
Transition of Care [facility]) - Progression Note    Patient Details  Name: [name] MRN: [id number] Date of Birth: 1940-02-28  Transition [facility] CM/SW Contact  Lima Chillemi, Lenice Llamas Phone Number: [phone]  08/31/2018, 11:25 AM  Clinical Narrative: PT is recommending SNF. Clinical Education officer, museum (CSW) met with patient and presented bed offers. Patient chose [facility]. CSW explained to patient that [facility] will have to approve SNF. Per [facility] admissions coordinator at [facility] she will start [facility] SNF authorization today. MD will order rapid covid test today. CSW contacted patient's 2 cousins listed on the [place] and made them aware of above. CSW attempted to contact APS worker however she did not answer and her voicemail was full.      Expected Discharge Plan: [place] Barriers to Discharge: Continued Medical Work up  Expected Discharge Plan and Services Expected Discharge Plan: [place]   Discharge Planning Services: CM Consult   Living arrangements for the past 2 months: Single Family Home                                       Social Determinants of Health (SDOH) Interventions    Readmission Risk Interventions No flowsheet data found.

## 2018-08-31 NOTE — TOC Transition Note (Addendum)
Transition of Care [facility]) - CM/SW Discharge Note   Patient Details  Name: LAFE CLERK MRN: [id number] Date of Birth: July 13, 1940  Transition of Care [facility]) CM/SW Contact:  Malayah Demuro, Lenice Llamas Phone Number: [phone]  08/31/2018, 3:03 PM   Clinical Narrative: Patient is medically stable for D/C to [facility]. Per [facility] admissions coordinator at [facility] SNF authorization has been received and patient can come today to room 35-A. Patient's covid test was negative today 7/20. RN will call report and arrange EMS for transport. Clinical Education officer, museum (CSW) sent D/C orders to [facility] via [place]. CSW made [facility] aware that patient will need an air mattress. Per [facility] they will have an air mattress for patient. Patient is aware of above. CSW contacted patient's cousin Bethena Roys and made her aware of above. CSW left patient's cousin Alvester Chou a voicemail making him aware of above. CSW attempted to call APS worker for a second time today and she did not answer. CSW could not leave a voicemail because it was voicemail was full. Please reconsult if future social work needs arise. CSW signing off.     Final next level of care: Skilled Nursing Facility Barriers to Discharge: Barriers Resolved   Patient Goals and CMS Choice Patient states their goals for this hospitalization and ongoing recovery are:: "to get water to drink" CMS Medicare.gov Compare Post Acute Care list provided to:: Patient Choice offered to / list presented to : Patient  Discharge Placement   Existing PASRR number confirmed : 08/28/18          Patient chooses bed at: [facility] Patient to be transferred to facility by: [facility] EMS Name of family member notified: Patient's cousin Bethena Roys is aware of D/C today. Patient and family notified of of transfer: 08/31/18  Discharge Plan and Services   Discharge Planning Services: CM Consult                                 Social  Determinants of Health (SDOH) Interventions     Readmission Risk Interventions No flowsheet data found.

## 2018-08-31 NOTE — Progress Notes (Signed)
[employer] - [place] at [facility]     PATIENT NAME: Spencer Edwards    MR#:  [id number]  DATE OF BIRTH:  04/10/1940  SUBJECTIVE:   No acute events overnight. s/p Left AKA POD # 3   REVIEW OF SYSTEMS:    Review of Systems  Constitutional: Negative for chills and fever.  HENT: Negative for congestion and tinnitus.   Eyes: Negative for blurred vision and double vision.  Respiratory: Negative for cough, shortness of breath and wheezing.   Cardiovascular: Negative for chest pain, orthopnea and PND.  Gastrointestinal: Negative for abdominal pain, diarrhea, nausea and vomiting.  Genitourinary: Negative for dysuria and hematuria.  Neurological: Positive for weakness (Generalized weakness. ). Negative for dizziness, sensory change and focal weakness.  All other systems reviewed and are negative.   Nutrition: Dysphagia 1 with nectar thick liquids Tolerating Diet:yes Tolerating PT: SNF  DRUG ALLERGIES:  No Known Allergies  VITALS:  Blood pressure (!) 100/59, pulse 100, temperature 99.2 F (37.3 C), temperature source Oral, resp. rate 19, height 5\' 10"  (1.778 m), weight 83 kg, SpO2 97 %.  PHYSICAL EXAMINATION:   Physical Exam  GENERAL:  78 y.o.-year-old thin cachectic patient lying in bed in no acute distress.  EYES: Pupils equal, round, reactive to light and accommodation. No scleral icterus. Extraocular muscles intact.  HEENT: Head atraumatic, normocephalic. Oropharynx and nasopharynx clear.  NECK:  Supple, no jugular venous distention. No thyroid enlargement, no tenderness.  LUNGS: Normal breath sounds bilaterally, no wheezing, rales, rhonchi. No use of accessory muscles of respiration.  CARDIOVASCULAR: S1, S2 normal. No murmurs, rubs, or gallops.  ABDOMEN: Soft, nontender, nondistended. Bowel sounds present. No organomegaly or mass.  EXTREMITIES: Bilateral LE amputation NEUROLOGIC: Cranial nerves II through XII are intact. No focal Motor or sensory  deficits b/l. Globally weak  PSYCHIATRIC: The patient is alert and oriented x 2.  SKIN: No obvious rash, lesion            LABORATORY PANEL:   CBC Recent Labs  Lab 08/28/18 0435  WBC 9.5  HGB 12.5*  HCT 40.1  PLT 262   ------------------------------------------------------------------------------------------------------------------  Chemistries  Recent Labs  Lab 08/28/18 0435  NA 148*  K 3.7  CL 117*  CO2 24  GLUCOSE 260*  BUN 23  CREATININE 1.16  CALCIUM 8.2*  MG 2.3   ------------------------------------------------------------------------------------------------------------------  Cardiac Enzymes No results for input(s): TROPONINI in the last 168 hours. ------------------------------------------------------------------------------------------------------------------  RADIOLOGY:  No results found.   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  -PT initiated--Rehab  *  Morganella & Coag. Neg staph bacteremia  -Continue Zosyn (total 10 days) now change to po abx -repeat BC negative in 2 days  * Acute frontal CVA - cont. ASA Status post neurology evaluation Carotid ultrasound showed bilateral minimal atherosclerotic disease Echocardiogram showing EF of 35 to 40% with no evidence of thrombus. - cont. PT as tolerated.   * Elevated troponin -Suspected to be supply demand ischemia from sepsis.  Troponins were mildly elevated and seen by cardiology and echo showing reduced systolic function  of EF of 35  to 40%.  No acute cardiac intervention presently. -Continue medical management.  * Acute rhabdomyolysis improved and resolved with IV fluids.  Continue to hold statin medication  * Acute hypernatremia - resolved with D5W * Uncontrolled diabetes mellitus -Blood sugars stable, patient's Lantus was held this morning as patient had surgery today.  Continue sliding scale insulin for now.  Follow blood sugars closely.  * GI bleed-resolved.  Hemoglobin stable. -Seen by gastroenterology and no plans for acute intervention given patient's multiple comorbidities.  - Continue po Protonix.  * Acute renal insufficiency - resolved w/ fluids and will cont. To monitor.  - renal dose meds and avoid nephrotoxins.   * Decubitus ulcer infected -Wound care consult appreciated -Surgery consult done, no debridement recommended - cont. abx as mentioned above.   * BPH - cont. Flomax.   Pt.  Will need SNF/STR upon discharge.  Social worker has informed patient's cousins about the discharge planning.  All the records are reviewed and case discussed with Care Management/Social Worker. Management plans discussed with the patient  CODE STATUS: DNR  TOTAL TIME TAKING CARE OF THIS PATIENT: 26 minutes.   Discharge once social worker is able to get in touch with patient's APS worker when bed offers presented.  Fritzi Mandes M.D on 08/31/2018 at 12:17 PM  Between 7am to 6pm - Pager - [phone]  After 6pm go to [email or web address] - Proofreader  [facility] [place] [facility]  Office  [phone]  CC: Primary care physician; Patient, No Pcp Per

## 2018-08-31 NOTE — Evaluation (Signed)
Objective Swallowing Evaluation: Type of Study: MBS-Modified Barium Swallow Study   Patient Details  Name: Spencer Edwards MRN: [id number] Date of Birth: 07/31/1940  Today's Date: 08/31/2018 Time: SLP Start Time (ACUTE ONLY): 1401 -SLP Stop Time (ACUTE ONLY): 1501  SLP Time Calculation (min) (ACUTE ONLY): 60 min   Past Medical History:  Past Medical History:  Diagnosis Date  . Acute kidney failure (HCC)   . Anxiety disorder   . Coronary artery disease    Non-ST elevation myocardial infarction in November 2015 in the setting of septic shock and multiorgan failure. Peak troponin was 25. Echo showed normal LV systolic function with inferior and posterior hypokinesis.  . Diabetes mellitus without complication (HCC)   . Dysphagia   . Gastro-esophageal reflux   . Hematuria   . Hyperlipidemia   . Hypernatremia   . Hypertension   . MI (myocardial infarction) (HCC)   . Muscle weakness   . PVD (peripheral vascular disease) (HCC)   . Rhabdomyolysis   . Urinary retention    Past Surgical History:  Past Surgical History:  Procedure Laterality Date  . ABDOMINAL SURGERY    . AMPUTATION Left 08/28/2018   Procedure: AMPUTATION ABOVE KNEE;  Surgeon: Renford DillsSchnier, Gregory G, MD;  Location: [facility] ORS;  Service: Vascular;  Laterality: Left;  . leg amputation     HPI: Pt is a 78 y.o. 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  Patient reportedly fell out of bed 4 days ago and crawled to the kitchen and tried to call 911 but was unable to stand.  He denies any chest pain.  Feels weak and is complaining of thirst.  EMS took pictures of his house which was an incredible state of disrepair.  APS report was filed.  Patient states he is  not taking any of his medications for 4 days. Pt denies any swallowing problems but states he "hiccups", maybe belches.  He was eating regular food w/ [employer], and drinking thin liquids at home.  CXR at admission: No active disease.  Of note, NSG reported pt has been lethargic and "not really eating much" since admission.    Subjective: The patient is EAGER to have some thin liquid    Assessment / Plan / Recommendation  CHL IP CLINICAL IMPRESSIONS 08/31/2018  Clinical Impression This 78 year old man; with concern for silent aspiration; is presenting with moderate oropharyngeal dysphagia characterized by disorganized oral management, mild oral residue of pureed solids, delayed pharyngeal swallow initiation, reduced pharyngeal pressure generation and moderate vallecular residue post swallow.  There is no observed laryngeal penetration or tracheal aspiration.  The patient appears to be protecting his airway while eating and drinking.  There is, however, retrograde movement within the cervical esophagus without esophageal-to pharyngeal backflow.  He does appear to be at risk for aspiration of reflux if he does not follow reflux precautions (elevate head of bed, small/more frequent meals, stay upright during and after meals).  The patient was assessed with puree, nectar-thick liquid, and thin liquid via straw.  He demonstrates improved pharyngeal clearance with thin liquids. This study supports a puree diet with thin liquids.  He should be evaluated at his next facility to determine optimal diet and swallowing guidelines.  SLP Visit Diagnosis Dysphagia, oropharyngeal phase (R13.12)  Attention and concentration deficit following --  Frontal lobe and executive function deficit following --  Impact on safety and function Mild aspiration risk;Other (comment)      CHL IP TREATMENT RECOMMENDATION 08/22/2018  Treatment Recommendations Therapy as outlined in treatment plan below     Prognosis 08/31/2018   Prognosis for Safe Diet Advancement Fair  Barriers to Reach Goals Cognitive deficits;Time post onset;Behavior  Barriers/Prognosis Comment --    CHL IP DIET RECOMMENDATION 08/31/2018  SLP Diet Recommendations Dysphagia 1 (Puree) solids;Thin liquid  Liquid Administration via Straw  Medication Administration Crushed with puree  Compensations Minimize environmental distractions;Slow rate;Small sips/bites;Lingual sweep for clearance of pocketing;Multiple dry swallows after each bite/sip;Follow solids with liquid  Postural Changes Remain semi-upright after after feeds/meals (Comment);Seated upright at 90 degrees      CHL IP OTHER RECOMMENDATIONS 08/31/2018  Recommended Consults Consider GI evaluation  Oral Care Recommendations Oral care BID;Staff/trained caregiver to provide oral care  Other Recommendations --      CHL IP FOLLOW UP RECOMMENDATIONS 08/31/2018  Follow up Recommendations Skilled Nursing facility      [facility] IP FREQUENCY AND DURATION 08/22/2018  Speech Therapy Frequency (ACUTE ONLY) min 3x week  Treatment Duration 2 weeks           CHL IP ORAL PHASE 08/31/2018  Oral Phase Impaired  Oral - Pudding Teaspoon --  Oral - Pudding Cup --  Oral - Honey Teaspoon --  Oral - Honey Cup --  Oral - Nectar Teaspoon --  Oral - Nectar Cup --2 small  Oral - Nectar Straw --  Oral - Thin Teaspoon --  Oral - Thin Cup --2 small  Oral - Thin Straw --3 pulls  Oral - Puree --2 teaspoon presentations  Oral - Mech Soft --DNT- pt edentulous  Oral - Regular --  Oral - Multi-Consistency --  Oral - Pill --  Oral Phase - Comment --    CHL IP PHARYNGEAL PHASE 08/31/2018  Pharyngeal Phase Impaired  Pharyngeal- Pudding Teaspoon --  Pharyngeal --  Pharyngeal- Pudding Cup --  Pharyngeal --  Pharyngeal- Honey Teaspoon --  Pharyngeal --  Pharyngeal- Honey Cup --  Pharyngeal --  Pharyngeal- Nectar Teaspoon --  Pharyngeal --  Pharyngeal- Nectar Cup --  Pharyngeal --  Pharyngeal- Nectar Straw --   Pharyngeal --  Pharyngeal- Thin Teaspoon --  Pharyngeal --  Pharyngeal- Thin Cup --  Pharyngeal --  Pharyngeal- Thin Straw --  Pharyngeal --  Pharyngeal- Puree --  Pharyngeal --  Pharyngeal- Mechanical Soft --  Pharyngeal --  Pharyngeal- Regular --  Pharyngeal --  Pharyngeal- Multi-consistency --  Pharyngeal --  Pharyngeal- Pill --  Pharyngeal --  Pharyngeal Comment --     CHL IP CERVICAL ESOPHAGEAL PHASE 08/31/2018  Cervical Esophageal Phase Impaired  Pudding Teaspoon --  Pudding Cup --  Honey Teaspoon --  Honey Cup --  Nectar Teaspoon --  Nectar Cup --  Nectar Straw --  Thin Teaspoon --  Thin Cup --  Thin Straw --  Puree --  Mechanical Soft --  Regular --  Multi-consistency --  Pill --  Cervical Esophageal Comment --Retrograde movement within the cervical esophgus    Leroy Sea, MS/CCC- SLP  Valetta Fuller, Susie 08/31/2018, 3:02 PM

## 2018-08-31 NOTE — Progress Notes (Signed)
Hand off report from off going nurse. Patient discharged to [facility] care via EMS.

## 2018-08-31 NOTE — Progress Notes (Addendum)
Report given to Nurse Jana Half at [facility]. Mrs. Bethena Roys Arms (cousin) made aware that pt will be transferring to [facility].

## 2018-08-31 NOTE — Progress Notes (Signed)
PT Cancellation Note  Patient Details Name: Spencer Edwards MRN: [id number] DOB: 11/25/1940   Cancelled Treatment:    Reason Eval/Treat Not Completed: Other (comment). Treatment attempted earlier; access restricted into pt's room. Pt now with a current discharge order to skilled nursing facility.    Larae Grooms, PTA 08/31/2018, 2:12 PM

## 2018-09-01 LAB — CULTURE, BLOOD (ROUTINE X 2)
Culture: NO GROWTH
Culture: NO GROWTH
Special Requests: ADEQUATE

## 2018-09-02 ENCOUNTER — Encounter: Payer: Self-pay | Admitting: Nurse Practitioner

## 2018-09-02 ENCOUNTER — Non-Acute Institutional Stay: Payer: Self-pay | Admitting: Nurse Practitioner

## 2018-09-02 VITALS — BP 100/64 | HR 98 | Temp 97.2°F | Resp 18 | Wt 178.0 lb

## 2018-09-02 DIAGNOSIS — R1310 Dysphagia, unspecified: Secondary | ICD-10-CM | POA: Insufficient documentation

## 2018-09-02 DIAGNOSIS — R5381 Other malaise: Secondary | ICD-10-CM

## 2018-09-02 DIAGNOSIS — Z515 Encounter for palliative care: Secondary | ICD-10-CM

## 2018-09-02 NOTE — Progress Notes (Signed)
[facility] Consult Note Telephone: [phone]  Fax: [phone]  PATIENT NAME: Spencer BrunnerRichard T Edwards DOB: 06/09/1940 MRN: [id number]  PRIMARY CARE PROVIDER:   Dr Kathleen LimeHodges REFERRING PROVIDER:  Dr Hodges/[facility] RESPONSIBLE PARTY:   Self I was asked by Dr Yetta FlockHodges to see Spencer. Edwards for Palliative consult for GOC.  RECOMMENDATIONS and PLAN:  1.Palliative care encounter Z51.5; Palliative medicine team will continue to support patient, patient's family, and medical team. Visit consisted of counseling and education dealing with the complex and emotionally intense issues of symptom management and palliative care in the setting of serious and potentially life-threatening illness  2. Dysphagia R13.10; secondary to CVA. Continue to monitor weights, appetite, aspiration precautions. Continue speech  3. Debility R53.81 secondary to late onset CVA progressive, encourage rom; continue therapy as able and will see where new baseline is  ASSESSMENT:     I visited and observed Spencer. Edwards. We talked about purpose of palliative care visit and he was in agreement giving verbal consent. We talked about how he was feeling today and he sure that he's doing okay. We talked about symptoms of pain which he denies. We talked about past medical history. We talked about recent hospitalization. We talked about his appetite. We talked about his ability to feed himself which is slow but he is progressing. It takes him a long time to eat. We talked about  physical therapy, mobility. We talked about challenges residing short-term rehab during covid-19 visitors. We talked about medical goals of care as he is a DNR. His wishes are to treat what is treatable. We talked about role of palliative care and plan of care. Discussed that will follow a monitor with palliative care during short-term rehab as he is taking one day at a time. We talked about Life review. Discuss  that will follow up in 1 week if needed or sooner should he declined. Spencer Edwards in agreement, he is his own responsible party. I have attempted to contact Spencer MacadamiaJudy Edwards update on palliative care visit.  I updated nursing staff.  I spent 60 minutes providing this consultation,  from 2:00pm to 3:00pm. More than 50% of the time in this consultation was spent coordinating communication.   HISTORY OF PRESENT ILLNESS:  Spencer Edwards is a 78 y.o. year old male with multiple medical problems including  Coronary artery disease, myocardial infarction, diabetes, dysphagia, gerd, hyperlipidemia, hypertension, peripheral vascular disease. Hospitalize 7 / 9 / 2020 to 7 / 20 / 2020 for sepsis secondary to left leg infection and gangrenous now s/p left AKA on 7/17 / 2020 requiring antibiotic therapy. He was seen by infectious disease and vascular surgery during hospitalization. Morganella and CoAG. Negative staph bacteremia. Acute frontal CVA continued on aspirin and neurology evaluation. Carotid ultrasound showing bilateral minimum artherosclerotic disease. Echo with EF 35% to 40%. Elevated troponin suspected to be supply demand ischemia from sepsis. Acute Rhabdomyolysis resolved with IV fluids. Uncontrolled diabetes on insulin therapy. GI bleed resolved with stable hemoglobin also seen by Gastroenterology to continue PPI. Decubitus ulcer with wound care console. He is at high risk for aspiration and on nectar thickened fluids for diet. He was discharged short-term rehab at [facility] where he currently resides. He does require assistance with turning, adl's. Speech has been working with him and he has been able to feed himself but slowly. He does cough at times during eating. Staff endorsed his appetite has been slowly improving. Staff  endorses he is able to verbalize his needs. At present he is lying in bed. He appears comfortable. No visitors present. Palliative Care was asked to help address goals  of care.   CODE STATUS: DNR  PPS: 30% HOSPICE ELIGIBILITY/DIAGNOSIS: TBD  PAST MEDICAL HISTORY:  Past Medical History:  Diagnosis Date   Acute kidney failure (HCC)    Anxiety disorder    Coronary artery disease    Non-ST elevation myocardial infarction in November 2015 in the setting of septic shock and multiorgan failure. Peak troponin was 25. Echo showed normal LV systolic function with inferior and posterior hypokinesis.   Diabetes mellitus without complication ([place])    Dysphagia    Gastro-esophageal reflux    Hematuria    Hyperlipidemia    Hypernatremia    Hypertension    MI (myocardial infarction) ([place])    Muscle weakness    PVD (peripheral vascular disease) ([place])    Rhabdomyolysis    Urinary retention     SOCIAL HX:  Social History   Tobacco Use   Smoking status: Former Smoker    Types: Cigarettes   Smokeless tobacco: Never Used  Substance Use Topics   Alcohol use: No    ALLERGIES: No Known Allergies   PERTINENT MEDICATIONS:  Outpatient Encounter Medications as of 09/02/2018  Medication Sig   ALPRAZolam (XANAX) 0.25 MG tablet Take 0.25 mg by mouth 3 (three) times daily.   Amino Acids-Protein Hydrolys (FEEDING SUPPLEMENT, PRO-STAT SUGAR FREE 64,) LIQD Take 30 mLs by mouth 2 (two) times daily.   aspirin EC 81 MG tablet Take 81 mg by mouth daily.    atorvastatin (LIPITOR) 80 MG tablet Take 80 mg by mouth daily.   bisacodyl (DULCOLAX) 10 MG suppository Place 10 mg rectally as needed for moderate constipation.   ciprofloxacin (CIPRO) 500 MG tablet Take 1 tablet (500 mg total) by mouth 2 (two) times daily for 4 days.   clopidogrel (PLAVIX) 75 MG tablet Take 75 mg by mouth daily.   insulin aspart (NOVOLOG) 100 UNIT/ML injection Inject 7 Units into the skin 3 (three) times daily with meals.   insulin glargine (LANTUS) 100 UNIT/ML injection Inject 24 Units into the skin at bedtime.   metoprolol tartrate (LOPRESSOR) 25 MG tablet Take 12.5 mg  by mouth 2 (two) times daily.   metroNIDAZOLE (FLAGYL) 500 MG tablet Take 1 tablet (500 mg total) by mouth every 8 (eight) hours for 4 days.   oxyCODONE-acetaminophen (PERCOCET/ROXICET) 5-325 MG tablet Take 1-2 tablets by mouth every 4 (four) hours as needed for moderate pain or severe pain.   pantoprazole (PROTONIX) 40 MG tablet Take 1 tablet (40 mg total) by mouth daily.   promethazine (PHENERGAN) 25 MG tablet Take 25 mg by mouth every 8 (eight) hours as needed for nausea or vomiting.   sennosides (SENOKOT) 8.8 MG/5ML syrup Take 5 mLs by mouth every 12 (twelve) hours.   famotidine (PEPCID) 20 MG tablet Take 20 mg by mouth daily.   ipratropium-albuterol (DUONEB) 0.5-2.5 (3) MG/3ML SOLN Take 3 mLs by nebulization every 4 (four) hours as needed.   tamsulosin (FLOMAX) 0.4 MG CAPS capsule Take 0.4 mg by mouth daily.   No facility-administered encounter medications on file as of 09/02/2018.     PHYSICAL EXAM:   General: NAD, functionally debilitated, chronically ill, pleasant male Cardiovascular: regular rate and rhythm Pulmonary: clear ant fields Abdomen: soft, nontender, + bowel sounds Extremities: no edema, no joint deformities Skin: no rashes Neurological: Weakness but otherwise nonfocal/non-ambulatory  Skylyn Slezak Z  Tilly Pernice, NP

## 2018-09-03 ENCOUNTER — Encounter: Payer: Self-pay | Admitting: Emergency Medicine

## 2018-09-03 ENCOUNTER — Inpatient Hospital Stay
Admission: EM | Admit: 2018-09-03 | Discharge: 2018-09-12 | DRG: 871 | Disposition: E | Payer: Medicare Other | Source: Skilled Nursing Facility | Attending: Pulmonary Disease | Admitting: Pulmonary Disease

## 2018-09-03 ENCOUNTER — Other Ambulatory Visit: Payer: Self-pay

## 2018-09-03 ENCOUNTER — Emergency Department: Payer: Medicare Other

## 2018-09-03 DIAGNOSIS — Z89612 Acquired absence of left leg above knee: Secondary | ICD-10-CM | POA: Diagnosis not present

## 2018-09-03 DIAGNOSIS — J9600 Acute respiratory failure, unspecified whether with hypoxia or hypercapnia: Secondary | ICD-10-CM

## 2018-09-03 DIAGNOSIS — E119 Type 2 diabetes mellitus without complications: Secondary | ICD-10-CM

## 2018-09-03 DIAGNOSIS — I5023 Acute on chronic systolic (congestive) heart failure: Secondary | ICD-10-CM | POA: Diagnosis present

## 2018-09-03 DIAGNOSIS — R0602 Shortness of breath: Secondary | ICD-10-CM | POA: Diagnosis present

## 2018-09-03 DIAGNOSIS — J189 Pneumonia, unspecified organism: Secondary | ICD-10-CM | POA: Diagnosis present

## 2018-09-03 DIAGNOSIS — Z66 Do not resuscitate: Secondary | ICD-10-CM | POA: Diagnosis present

## 2018-09-03 DIAGNOSIS — A411 Sepsis due to other specified staphylococcus: Secondary | ICD-10-CM | POA: Diagnosis present

## 2018-09-03 DIAGNOSIS — J9601 Acute respiratory failure with hypoxia: Secondary | ICD-10-CM | POA: Diagnosis present

## 2018-09-03 DIAGNOSIS — K7200 Acute and subacute hepatic failure without coma: Secondary | ICD-10-CM | POA: Diagnosis present

## 2018-09-03 DIAGNOSIS — Y95 Nosocomial condition: Secondary | ICD-10-CM | POA: Diagnosis present

## 2018-09-03 DIAGNOSIS — Z7902 Long term (current) use of antithrombotics/antiplatelets: Secondary | ICD-10-CM | POA: Diagnosis not present

## 2018-09-03 DIAGNOSIS — E785 Hyperlipidemia, unspecified: Secondary | ICD-10-CM | POA: Diagnosis present

## 2018-09-03 DIAGNOSIS — Z8249 Family history of ischemic heart disease and other diseases of the circulatory system: Secondary | ICD-10-CM | POA: Diagnosis not present

## 2018-09-03 DIAGNOSIS — Z515 Encounter for palliative care: Secondary | ICD-10-CM | POA: Diagnosis not present

## 2018-09-03 DIAGNOSIS — I252 Old myocardial infarction: Secondary | ICD-10-CM

## 2018-09-03 DIAGNOSIS — Z20828 Contact with and (suspected) exposure to other viral communicable diseases: Secondary | ICD-10-CM | POA: Diagnosis present

## 2018-09-03 DIAGNOSIS — E870 Hyperosmolality and hypernatremia: Secondary | ICD-10-CM | POA: Diagnosis present

## 2018-09-03 DIAGNOSIS — I110 Hypertensive heart disease with heart failure: Secondary | ICD-10-CM | POA: Diagnosis present

## 2018-09-03 DIAGNOSIS — R6521 Severe sepsis with septic shock: Secondary | ICD-10-CM | POA: Diagnosis present

## 2018-09-03 DIAGNOSIS — E1151 Type 2 diabetes mellitus with diabetic peripheral angiopathy without gangrene: Secondary | ICD-10-CM | POA: Diagnosis present

## 2018-09-03 DIAGNOSIS — F0390 Unspecified dementia without behavioral disturbance: Secondary | ICD-10-CM | POA: Diagnosis present

## 2018-09-03 DIAGNOSIS — Z789 Other specified health status: Secondary | ICD-10-CM

## 2018-09-03 DIAGNOSIS — N390 Urinary tract infection, site not specified: Secondary | ICD-10-CM | POA: Diagnosis present

## 2018-09-03 DIAGNOSIS — A4159 Other Gram-negative sepsis: Secondary | ICD-10-CM | POA: Diagnosis present

## 2018-09-03 DIAGNOSIS — L89150 Pressure ulcer of sacral region, unstageable: Secondary | ICD-10-CM | POA: Diagnosis present

## 2018-09-03 DIAGNOSIS — Z8673 Personal history of transient ischemic attack (TIA), and cerebral infarction without residual deficits: Secondary | ICD-10-CM

## 2018-09-03 DIAGNOSIS — Z79891 Long term (current) use of opiate analgesic: Secondary | ICD-10-CM

## 2018-09-03 DIAGNOSIS — F419 Anxiety disorder, unspecified: Secondary | ICD-10-CM | POA: Diagnosis present

## 2018-09-03 DIAGNOSIS — K219 Gastro-esophageal reflux disease without esophagitis: Secondary | ICD-10-CM | POA: Diagnosis present

## 2018-09-03 DIAGNOSIS — D649 Anemia, unspecified: Secondary | ICD-10-CM | POA: Diagnosis present

## 2018-09-03 DIAGNOSIS — E875 Hyperkalemia: Secondary | ICD-10-CM | POA: Diagnosis present

## 2018-09-03 DIAGNOSIS — N170 Acute kidney failure with tubular necrosis: Secondary | ICD-10-CM | POA: Diagnosis present

## 2018-09-03 DIAGNOSIS — Z87891 Personal history of nicotine dependence: Secondary | ICD-10-CM

## 2018-09-03 DIAGNOSIS — Z7982 Long term (current) use of aspirin: Secondary | ICD-10-CM

## 2018-09-03 DIAGNOSIS — L89110 Pressure ulcer of right upper back, unstageable: Secondary | ICD-10-CM | POA: Diagnosis present

## 2018-09-03 DIAGNOSIS — L89120 Pressure ulcer of left upper back, unstageable: Secondary | ICD-10-CM | POA: Diagnosis present

## 2018-09-03 DIAGNOSIS — I2510 Atherosclerotic heart disease of native coronary artery without angina pectoris: Secondary | ICD-10-CM | POA: Diagnosis present

## 2018-09-03 DIAGNOSIS — A419 Sepsis, unspecified organism: Secondary | ICD-10-CM | POA: Diagnosis present

## 2018-09-03 DIAGNOSIS — I10 Essential (primary) hypertension: Secondary | ICD-10-CM | POA: Diagnosis present

## 2018-09-03 DIAGNOSIS — Z79899 Other long term (current) drug therapy: Secondary | ICD-10-CM

## 2018-09-03 DIAGNOSIS — E872 Acidosis: Secondary | ICD-10-CM | POA: Diagnosis present

## 2018-09-03 DIAGNOSIS — Z794 Long term (current) use of insulin: Secondary | ICD-10-CM

## 2018-09-03 LAB — COMPREHENSIVE METABOLIC PANEL
ALT: 14 U/L (ref 0–44)
AST: 24 U/L (ref 15–41)
Albumin: 2.4 g/dL — ABNORMAL LOW (ref 3.5–5.0)
Alkaline Phosphatase: 74 U/L (ref 38–126)
Anion gap: 10 (ref 5–15)
BUN: 25 mg/dL — ABNORMAL HIGH (ref 8–23)
CO2: 23 mmol/L (ref 22–32)
Calcium: 8.3 mg/dL — ABNORMAL LOW (ref 8.9–10.3)
Chloride: 116 mmol/L — ABNORMAL HIGH (ref 98–111)
Creatinine, Ser: 1.41 mg/dL — ABNORMAL HIGH (ref 0.61–1.24)
GFR calc Af Amer: 55 mL/min — ABNORMAL LOW (ref >60)
GFR calc non Af Amer: 47 mL/min — ABNORMAL LOW (ref >60)
Glucose, Bld: 112 mg/dL — ABNORMAL HIGH (ref 70–99)
Potassium: 3.8 mmol/L (ref 3.5–5.1)
Sodium: 149 mmol/L — ABNORMAL HIGH (ref 135–145)
Total Bilirubin: 0.7 mg/dL (ref 0.3–1.2)
Total Protein: 5.7 g/dL — ABNORMAL LOW (ref 6.5–8.1)

## 2018-09-03 LAB — CBC WITH DIFFERENTIAL/PLATELET
Abs Immature Granulocytes: 0.1 10*3/uL — ABNORMAL HIGH (ref 0.00–0.07)
Basophils Absolute: 0.1 10*3/uL (ref 0.0–0.1)
Basophils Relative: 0 %
Eosinophils Absolute: 0.2 10*3/uL (ref 0.0–0.5)
Eosinophils Relative: 1 %
HCT: 37.8 % — ABNORMAL LOW (ref 39.0–52.0)
Hemoglobin: 11.3 g/dL — ABNORMAL LOW (ref 13.0–17.0)
Immature Granulocytes: 1 %
Lymphocytes Relative: 6 %
Lymphs Abs: 1 10*3/uL (ref 0.7–4.0)
MCH: 26.9 pg (ref 26.0–34.0)
MCHC: 29.9 g/dL — ABNORMAL LOW (ref 30.0–36.0)
MCV: 90 fL (ref 80.0–100.0)
Monocytes Absolute: 0.5 10*3/uL (ref 0.1–1.0)
Monocytes Relative: 3 %
Neutro Abs: 15.8 10*3/uL — ABNORMAL HIGH (ref 1.7–7.7)
Neutrophils Relative %: 89 %
Platelets: 409 10*3/uL — ABNORMAL HIGH (ref 150–400)
RBC: 4.2 MIL/uL — ABNORMAL LOW (ref 4.22–5.81)
RDW: 15.7 % — ABNORMAL HIGH (ref 11.5–15.5)
WBC: 17.7 10*3/uL — ABNORMAL HIGH (ref 4.0–10.5)
nRBC: 0 % (ref 0.0–0.2)

## 2018-09-03 LAB — URINALYSIS, ROUTINE W REFLEX MICROSCOPIC
Bacteria, UA: NONE SEEN
Bilirubin Urine: NEGATIVE
Glucose, UA: NEGATIVE mg/dL
Ketones, ur: NEGATIVE mg/dL
Nitrite: NEGATIVE
Protein, ur: 30 mg/dL — AB
RBC / HPF: >50 RBC/hpf — ABNORMAL HIGH (ref 0–5)
Specific Gravity, Urine: 1.016 (ref 1.005–1.030)
WBC, UA: >50 WBC/hpf — ABNORMAL HIGH (ref 0–5)
pH: 5 (ref 5.0–8.0)

## 2018-09-03 LAB — SURGICAL PATHOLOGY

## 2018-09-03 LAB — SARS CORONAVIRUS 2 BY RT PCR (HOSPITAL ORDER, PERFORMED IN ~~LOC~~ HOSPITAL LAB): SARS Coronavirus 2: NEGATIVE

## 2018-09-03 LAB — PROTIME-INR
INR: 1.3 — ABNORMAL HIGH (ref 0.8–1.2)
Prothrombin Time: 15.9 seconds — ABNORMAL HIGH (ref 11.4–15.2)

## 2018-09-03 LAB — LACTIC ACID, PLASMA
Lactic Acid, Venous: 3.6 mmol/L (ref 0.5–1.9)
Lactic Acid, Venous: 2.2 mmol/L (ref 0.5–1.9)

## 2018-09-03 LAB — APTT: aPTT: 29 seconds (ref 24–36)

## 2018-09-03 MED ORDER — SODIUM CHLORIDE 0.9 % IV BOLUS (SEPSIS)
1000.0000 mL | Freq: Once | INTRAVENOUS | Status: AC
  Administered 2018-09-03: 20:00:00 1000 mL via INTRAVENOUS

## 2018-09-03 MED ORDER — VANCOMYCIN HCL IN DEXTROSE 1-5 GM/200ML-% IV SOLN
1000.0000 mg | Freq: Once | INTRAVENOUS | Status: AC
  Administered 2018-09-03: 21:00:00 1000 mg via INTRAVENOUS
  Filled 2018-09-03: qty 200

## 2018-09-03 MED ORDER — SODIUM CHLORIDE 0.9 % IV SOLN
2.0000 g | Freq: Two times a day (BID) | INTRAVENOUS | Status: DC
  Administered 2018-09-04 (×2): 2 g via INTRAVENOUS
  Filled 2018-09-03 (×3): qty 2

## 2018-09-03 MED ORDER — SODIUM CHLORIDE 0.9 % IV BOLUS (SEPSIS)
500.0000 mL | Freq: Once | INTRAVENOUS | Status: AC
  Administered 2018-09-03: 500 mL via INTRAVENOUS

## 2018-09-03 MED ORDER — ALBUMIN HUMAN 25 % IV SOLN
25.0000 g | Freq: Once | INTRAVENOUS | Status: AC
  Administered 2018-09-04: 25 g via INTRAVENOUS
  Filled 2018-09-03: qty 100

## 2018-09-03 MED ORDER — NOREPINEPHRINE 4 MG/250ML-% IV SOLN
0.0000 ug/min | INTRAVENOUS | Status: DC
  Administered 2018-09-04 (×2): 20 ug/min via INTRAVENOUS
  Administered 2018-09-04: 10 ug/min via INTRAVENOUS
  Filled 2018-09-03 (×5): qty 250

## 2018-09-03 MED ORDER — SODIUM CHLORIDE 0.9 % IV SOLN
0.0000 ug/min | INTRAVENOUS | Status: DC
  Filled 2018-09-03: qty 1

## 2018-09-03 MED ORDER — SODIUM CHLORIDE 0.9 % IV BOLUS (SEPSIS)
1000.0000 mL | Freq: Once | INTRAVENOUS | Status: AC
  Administered 2018-09-03: 1000 mL via INTRAVENOUS

## 2018-09-03 MED ORDER — VANCOMYCIN HCL IN DEXTROSE 750-5 MG/150ML-% IV SOLN
750.0000 mg | Freq: Once | INTRAVENOUS | Status: AC
  Administered 2018-09-04: 750 mg via INTRAVENOUS
  Filled 2018-09-03: qty 150

## 2018-09-03 MED ORDER — SODIUM CHLORIDE 0.9 % IV SOLN
1.0000 g | Freq: Once | INTRAVENOUS | Status: AC
  Administered 2018-09-03: 21:00:00 1 g via INTRAVENOUS
  Filled 2018-09-03: qty 1

## 2018-09-03 NOTE — ED Notes (Signed)
Pt bp 84/56, MD aware. Pt to finish fluid bolus then recheck bp per MD.

## 2018-09-03 NOTE — ED Triage Notes (Signed)
pt arrives via [facility] from [facility] health care with c/o possible aspiration. temp 103.1 cbg 129, 96/55 bp, tachycardiac for ems. Crackles heard on auscultation by ems. DNR on file. Recently treated for infection to left above the knee ambuatation. Pt lethargic and non-verbal at this time.

## 2018-09-03 NOTE — ED Notes (Signed)
Pt denies pain at this time

## 2018-09-03 NOTE — ED Provider Notes (Signed)
[facility] Emergency Department Provider Note   ____________________________________________   I have reviewed the triage vital signs and the nursing notes.   HISTORY  Chief Complaint Aspiration   History limited by and level 5 caveat due to dementia   HPI Spencer Edwards is a 78 y.o. male who presents to the emergency department today from facility because of concerns for possible aspiration.  Apparently they noticed that he had become tachycardic and tachypneic.  Patient was noted to be tachypneic and tachycardic per EMS.  Patient was also febrile for EMS.  Patient unfortunately cannot give any history.   Records reviewed. 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  Past Medical History:  Diagnosis Date  . Acute kidney failure ([place])   . Anxiety disorder   . Coronary artery disease    Non-ST elevation myocardial infarction in November 2015 in the setting of septic shock and multiorgan failure. Peak troponin was 25. Echo showed normal LV systolic function with inferior and posterior hypokinesis.  . Diabetes mellitus without complication ([place])   . Dysphagia   . Gastro-esophageal reflux   . Hematuria   . Hyperlipidemia   . Hypernatremia   . Hypertension   . MI (myocardial infarction) ([place])   . Muscle weakness   . PVD (peripheral vascular disease) ([place])   . Rhabdomyolysis   . Urinary retention     Patient Active Problem List   Diagnosis Date Noted  . Debility 09/02/2018  . Dysphagia 09/02/2018  . Pressure injury of skin 08/21/2018  . Diabetic foot infection ([place])   . Palliative care encounter   . Sepsis ([place]) 08/20/2018  . Coronary artery disease   . Hyperlipidemia   . Hypertension     Past Surgical History:  Procedure Laterality Date  . ABDOMINAL SURGERY    . AMPUTATION Left 08/28/2018   Procedure: AMPUTATION ABOVE KNEE;  Surgeon: Katha Cabal, MD;  Location: [facility] ORS;   Service: Vascular;  Laterality: Left;  . leg amputation      Prior to Admission medications   Medication Sig Start Date End Date Taking? Authorizing Provider  ALPRAZolam (XANAX) 0.25 MG tablet Take 0.25 mg by mouth 3 (three) times daily.    [provider]  Amino Acids-Protein Hydrolys (FEEDING SUPPLEMENT, PRO-STAT SUGAR FREE 64,) LIQD Take 30 mLs by mouth 2 (two) times daily.    [provider]  aspirin EC 81 MG tablet Take 81 mg by mouth daily.     [provider]  atorvastatin (LIPITOR) 80 MG tablet Take 80 mg by mouth daily.    [provider]  bisacodyl (DULCOLAX) 10 MG suppository Place 10 mg rectally as needed for moderate constipation.    [provider]  ciprofloxacin (CIPRO) 500 MG tablet Take 1 tablet (500 mg total) by mouth 2 (two) times daily for 4 days. 08/31/18 09/04/18  Fritzi Mandes, MD  clopidogrel (PLAVIX) 75 MG tablet Take 75 mg by mouth daily.    [provider]  famotidine (PEPCID) 20 MG tablet Take 20 mg by mouth daily.    [provider]  insulin aspart (NOVOLOG) 100 UNIT/ML injection Inject 7 Units into the skin 3 (three) times daily with meals. 08/31/18   Fritzi Mandes, MD  insulin glargine (LANTUS) 100 UNIT/ML injection Inject 24 Units into the skin at bedtime.    [provider]  ipratropium-albuterol (DUONEB) 0.5-2.5 (3) MG/3ML SOLN Take 3 mLs by nebulization every  4 (four) hours as needed.    [provider]  metoprolol tartrate (LOPRESSOR) 25 MG tablet Take 12.5 mg by mouth 2 (two) times daily.    [provider]  metroNIDAZOLE (FLAGYL) 500 MG tablet Take 1 tablet (500 mg total) by mouth every 8 (eight) hours for 4 days. 08/31/18 09/04/18  Enedina FinnerPatel, Sona, MD  oxyCODONE-acetaminophen (PERCOCET/ROXICET) 5-325 MG tablet Take 1-2 tablets by mouth every 4 (four) hours as needed for moderate pain or severe pain. 08/31/18   Enedina FinnerPatel, Sona, MD  pantoprazole (PROTONIX) 40 MG tablet Take 1 tablet (40 mg  total) by mouth daily. 09/01/18   Enedina FinnerPatel, Sona, MD  promethazine (PHENERGAN) 25 MG tablet Take 25 mg by mouth every 8 (eight) hours as needed for nausea or vomiting.    [provider]  sennosides (SENOKOT) 8.8 MG/5ML syrup Take 5 mLs by mouth every 12 (twelve) hours.    [provider]  tamsulosin (FLOMAX) 0.4 MG CAPS capsule Take 0.4 mg by mouth daily.    [provider]    Allergies Patient has no known allergies.  Family History  Problem Relation Age of Onset  . Heart Problems Father   . Heart disease Father     Social History Social History   Tobacco Use  . Smoking status: Former Smoker    Types: Cigarettes  . Smokeless tobacco: Never Used  Substance Use Topics  . Alcohol use: No  . Drug use: No    Review of Systems Unable to obtain reliable review of systems secondary to dementia ____________________________________________   PHYSICAL EXAM:  VITAL SIGNS: ED Triage Vitals  Enc Vitals Group     BP 06-20-18 1949 111/64     Pulse Rate 06-20-18 1949 (!) 136     Resp 06-20-18 1949 (!) 43     Temp --      Temp src --      SpO2 --      Weight 06-20-18 1950 177 lb 14.6 oz (80.7 kg)     Height 06-20-18 1950 5\' 4"  (1.626 m)   Constitutional: Awake, not oriented Eyes: Conjunctivae are normal.  ENT      Head: Normocephalic and atraumatic.      Nose: No congestion/rhinnorhea.      Mouth/Throat: Mucous membranes are moist.      Neck: No stridor. Hematological/Lymphatic/Immunilogical: No cervical lymphadenopathy. Cardiovascular: Tachycardic, regular rhythm.  No murmurs, rubs, or gallops appreciated Respiratory: Tachypneic, somewhat diffuse rhonchi. Gastrointestinal: Soft and non tender. No rebound. No guarding.  Genitourinary: Deferred Musculoskeletal: Normal range of motion in all extremities. No lower extremity edema. Neurologic: Awake, minimally alert.  Did try to answer his name when asked.  Otherwise cannot answer questions. Skin:  Skin  is warm, dry and intact. No rash noted.  ____________________________________________    LABS (pertinent positives/negatives)  CMP na 149, k 3.8, gl 116, cr 1.41 CBC wbc 17.7, hgb 11.3, plt 409 Lactic 3.6 UA large hgb dipstick, moderate leukocytes, >50 rbc and wbc COVID negative  ____________________________________________    RADIOLOGY  CXR Concern for multifocal pneumonia  ____________________________________________   PROCEDURES  Procedures  CRITICAL CARE Performed by: Phineas SemenGraydon Kalup Jaquith   Total critical care time: 25 minutes  Critical care time was exclusive of separately billable procedures and treating other patients.  Critical care was necessary to treat or prevent imminent or life-threatening deterioration.  Critical care was time spent personally by me on the following activities: development of treatment plan with patient and/or surrogate as well as nursing,  discussions with consultants, evaluation of patient's response to treatment, examination of patient, obtaining history from patient or surrogate, ordering and performing treatments and interventions, ordering and review of laboratory studies, ordering and review of radiographic studies, pulse oximetry and re-evaluation of patient's condition.  ____________________________________________   INITIAL IMPRESSION / ASSESSMENT AND PLAN / ED COURSE  Pertinent labs & imaging results that were available during my care of the patient were reviewed by me and considered in my medical decision making (see chart for details).   Patient presented to the emergency department today because of concern for possible aspiration event. Initial vital signs were concerning for sepsis. Broad work up was initiated. Fluid boluses and antibiotics were ordered. Work up was consistent with pneumonia as well as possible UTI. Given recent admission do have concern for hcap. Discussed with hospitalist for  admission.  ____________________________________________   FINAL CLINICAL IMPRESSION(S) / ED DIAGNOSES  Final diagnoses:  Shortness of breath  Sepsis, due to unspecified organism, unspecified whether acute organ dysfunction present [facility](HCC)  Healthcare-associated pneumonia     Note: This dictation was prepared with Dragon dictation. Any transcriptional errors that result from this process are unintentional     Phineas SemenGoodman, Melody Savidge, MD 08/18/2018 2216

## 2018-09-03 NOTE — ED Notes (Signed)
ED TO INPATIENT HANDOFF REPORT  ED Nurse Name and Phone #:  Toma Copier 23  S Name/Age/Gender Spencer Edwards 78 y.o. male Room/Bed: ED02A/ED02A  Code Status   Code Status: Prior  Home/SNF/Other Skilled nursing facility Patient oriented to: self and place Is this baseline? No   Triage Complete: Triage complete  Chief Complaint aspiration ems  Triage Note pt arrives via acems from [facility] with c/o possible aspiration. temp 103.1 cbg 129, 96/55 bp, tachycardiac for ems. Crackles heard on auscultation by ems. DNR on file. Recently treated for infection to left above the knee ambuatation. Pt lethargic and non-verbal at this time.    Allergies No Known Allergies  Level of Care/Admitting Diagnosis ED Disposition    ED Disposition Condition Comment   Admit  Hospital Area: [facility] [[id number]]  Level of Care: ICU [6]  Covid Evaluation: Confirmed COVID Negative  Diagnosis: Septic shock [facility]) [[id number]]  Admitting Physician: Oralia Manis [[id number]]  Attending Physician: Oralia Manis [phone]  Estimated length of stay: past midnight tomorrow  Certification:: I certify this patient will need inpatient services for at least 2 midnights  PT Class (Do Not Modify): Inpatient [101]  PT Acc Code (Do Not Modify): Private [1]       B Medical/Surgery History Past Medical History:  Diagnosis Date  . Acute kidney failure (HCC)   . Anxiety disorder   . Coronary artery disease    Non-ST elevation myocardial infarction in November 2015 in the setting of septic shock and multiorgan failure. Peak troponin was 25. Echo showed normal LV systolic function with inferior and posterior hypokinesis.  . Diabetes mellitus without complication (HCC)   . Dysphagia   . Gastro-esophageal reflux   . Hematuria   . Hyperlipidemia   . Hypernatremia   . Hypertension   . MI (myocardial infarction) (HCC)   . Muscle weakness   . PVD (peripheral vascular disease) (HCC)    . Rhabdomyolysis   . Urinary retention    Past Surgical History:  Procedure Laterality Date  . ABDOMINAL SURGERY    . AMPUTATION Left 08/28/2018   Procedure: AMPUTATION ABOVE KNEE;  Surgeon: Renford Dills, MD;  Location: [facility] ORS;  Service: Vascular;  Laterality: Left;  . leg amputation       A IV Location/Drains/Wounds Patient Lines/Drains/Airways Status   Active Line/Drains/Airways    Name:   Placement date:   Placement time:   Site:   Days:   Peripheral IV 08/22/18 Right;Lateral Forearm   08/22/18    0958    Forearm   12   Peripheral IV 08/23/18 Anterior;Left;Proximal Forearm   08/23/18    -    Forearm   11   Peripheral IV 08/26/2018 Right Forearm   08/27/2018    1954    Forearm   less than 1   Incision (Closed) 08/28/18 Leg Left   08/28/18    1517     6   Pressure Injury 08/20/18 Buttocks Right;Left Stage III -  Full thickness tissue loss. Subcutaneous fat may be visible but bone, tendon or muscle are NOT exposed.   08/20/18    2000     14   Pressure Injury 08/20/18 Sacrum Unstageable - Full thickness tissue loss in which the base of the ulcer is covered by slough (yellow, tan, gray, green or brown) and/or eschar (tan, brown or black) in the wound bed. black sacral wound   08/20/18    2000     14  Pressure Injury 08/20/18 Back Left Unstageable - Full thickness tissue loss in which the base of the ulcer is covered by slough (yellow, tan, gray, green or brown) and/or eschar (tan, brown or black) in the wound bed.   08/20/18    2000     14   Pressure Injury 08/20/18 Left Stage III -  Full thickness tissue loss. Subcutaneous fat may be visible but bone, tendon or muscle are NOT exposed. three small stage three upper left shoulder   08/20/18    2000     14   Pressure Injury 08/20/18 Right Stage III -  Full thickness tissue loss. Subcutaneous fat may be visible but bone, tendon or muscle are NOT exposed. two small stage three/right back   08/20/18    2000     14          Intake/Output  Last 24 hours  Intake/Output Summary (Last 24 hours) at 2018/04/27 2335 Last data filed at 2018/04/27 2227 Gross per 24 hour  Intake 1600 ml  Output -  Net 1600 ml    Labs/Imaging Results for orders placed or performed during the hospital encounter of 06/30/2018 (from the past 48 hour(s))  Lactic acid, plasma     Status: Abnormal   Collection Time: 06/30/2018  8:02 PM  Result Value Ref Range   Lactic Acid, Venous 3.6 (HH) 0.5 - 1.9 mmol/L    Comment: CRITICAL RESULT CALLED TO, READ BACK BY AND VERIFIED WITH ANNIE SMITH @2036  Feb 24, 2018 MJU Performed at [facility], [address] [place], [place] [address]   Comprehensive metabolic panel     Status: Abnormal   Collection Time: 06/30/2018  8:02 PM  Result Value Ref Range   Sodium 149 (H) 135 - 145 mmol/L   Potassium 3.8 3.5 - 5.1 mmol/L   Chloride 116 (H) 98 - 111 mmol/L   CO2 23 22 - 32 mmol/L   Glucose, Bld 112 (H) 70 - 99 mg/dL   BUN 25 (H) 8 - 23 mg/dL   Creatinine, Ser 6.041.41 (H) 0.61 - 1.24 mg/dL   Calcium 8.3 (L) 8.9 - 10.3 mg/dL   Total Protein 5.7 (L) 6.5 - 8.1 g/dL   Albumin 2.4 (L) 3.5 - 5.0 g/dL   AST 24 15 - 41 U/L   ALT 14 0 - 44 U/L   Alkaline Phosphatase 74 38 - 126 U/L   Total Bilirubin 0.7 0.3 - 1.2 mg/dL   GFR calc non Af Amer 47 (L) >60 mL/min   GFR calc Af Amer 55 (L) >60 mL/min   Anion gap 10 5 - 15    Comment: Performed at [facility], [address] [place], [place] [address]  CBC WITH DIFFERENTIAL     Status: Abnormal   Collection Time: 06/30/2018  8:02 PM  Result Value Ref Range   WBC 17.7 (H) 4.0 - 10.5 K/uL   RBC 4.20 (L) 4.22 - 5.81 MIL/uL   Hemoglobin 11.3 (L) 13.0 - 17.0 g/dL   HCT 11.937.8 (L) 14.739.0 - 82.952.0 %   MCV 90.0 80.0 - 100.0 fL   MCH 26.9 26.0 - 34.0 pg   MCHC 29.9 (L) 30.0 - 36.0 g/dL   RDW 56.215.7 (H) 13.011.5 - 86.515.5 %   Platelets 409 (H) 150 - 400 K/uL   nRBC 0.0 0.0 - 0.2 %   Neutrophils Relative % 89 %   Neutro Abs 15.8 (H) 1.7 - 7.7 K/uL   Lymphocytes Relative 6 %  Lymphs Abs 1.0 0.7 - 4.0 K/uL   Monocytes Relative 3 %   Monocytes Absolute 0.5 0.1 - 1.0 K/uL   Eosinophils Relative 1 %   Eosinophils Absolute 0.2 0.0 - 0.5 K/uL   Basophils Relative 0 %   Basophils Absolute 0.1 0.0 - 0.1 K/uL   Immature Granulocytes 1 %   Abs Immature Granulocytes 0.10 (H) 0.00 - 0.07 K/uL    Comment: Performed at [facility], [address] [place], [place] [address]  APTT     Status: None   Collection Time: 09/05/2018  8:02 PM  Result Value Ref Range   aPTT 29 24 - 36 seconds    Comment: Performed at [facility], [address] [place], [place] [address]  Protime-INR     Status: Abnormal   Collection Time: 09-05-18  8:02 PM  Result Value Ref Range   Prothrombin Time 15.9 (H) 11.4 - 15.2 seconds   INR 1.3 (H) 0.8 - 1.2    Comment: (NOTE) INR goal varies based on device and disease states. Performed at [facility], [address] [place], [place] [address]   Urinalysis, Routine w reflex microscopic     Status: Abnormal   Collection Time: Sep 05, 2018  8:02 PM  Result Value Ref Range   Color, Urine AMBER (A) YELLOW    Comment: BIOCHEMICALS MAY BE AFFECTED BY COLOR   APPearance CLOUDY (A) CLEAR   Specific Gravity, Urine 1.016 1.005 - 1.030   pH 5.0 5.0 - 8.0   Glucose, UA NEGATIVE NEGATIVE mg/dL   Hgb urine dipstick LARGE (A) NEGATIVE   Bilirubin Urine NEGATIVE NEGATIVE   Ketones, ur NEGATIVE NEGATIVE mg/dL   Protein, ur 30 (A) NEGATIVE mg/dL   Nitrite NEGATIVE NEGATIVE   Leukocytes,Ua MODERATE (A) NEGATIVE   RBC / HPF >50 (H) 0 - 5 RBC/hpf   WBC, UA >50 (H) 0 - 5 WBC/hpf   Bacteria, UA NONE SEEN NONE SEEN   Squamous Epithelial / LPF 0-5 0 - 5   WBC Clumps PRESENT    Mucus PRESENT    Budding Yeast PRESENT     Comment: Performed at [facility], [address] [place], [place] [address]  SARS Coronavirus 2 (CEPHEID- Performed in [place] hospital lab), Hosp Order     Status: None   Collection Time: Sep 05, 2018   8:03 PM   Specimen: Nasopharyngeal Swab  Result Value Ref Range   SARS Coronavirus 2 NEGATIVE NEGATIVE    Comment: (NOTE) If result is NEGATIVE SARS-CoV-2 target nucleic acids are NOT DETECTED. The SARS-CoV-2 RNA is generally detectable in upper and lower  respiratory specimens during the acute phase of infection. The lowest  concentration of SARS-CoV-2 viral copies this assay can detect is 250  copies / mL. A negative result does not preclude SARS-CoV-2 infection  and should not be used as the sole basis for treatment or other  patient management decisions.  A negative result may occur with  improper specimen collection / handling, submission of specimen other  than nasopharyngeal swab, presence of viral mutation(s) within the  areas targeted by this assay, and inadequate number of viral copies  (<250 copies / mL). A negative result must be combined with clinical  observations, patient history, and epidemiological information. If result is POSITIVE SARS-CoV-2 target nucleic acids are DETECTED. The SARS-CoV-2 RNA is generally detectable in upper and lower  respiratory specimens dur ing the acute phase of infection.  Positive  results are indicative of active  infection with SARS-CoV-2.  Clinical  correlation with patient history and other diagnostic information is  necessary to determine patient infection status.  Positive results do  not rule out bacterial infection or co-infection with other viruses. If result is PRESUMPTIVE POSTIVE SARS-CoV-2 nucleic acids MAY BE PRESENT.   A presumptive positive result was obtained on the submitted specimen  and confirmed on repeat testing.  While 2019 novel coronavirus  (SARS-CoV-2) nucleic acids may be present in the submitted sample  additional confirmatory testing may be necessary for epidemiological  and / or clinical management purposes  to differentiate between  SARS-CoV-2 and other Sarbecovirus currently known to infect humans.  If  clinically indicated additional testing with an alternate test  methodology [phone]([id number]) is advised. The SARS-CoV-2 RNA is generally  detectable in upper and lower respiratory sp ecimens during the acute  phase of infection. The expected result is Negative. Fact Sheet for Patients:  BoilerBrush.com.cyhttps://www.fda.gov/media/136312/download Fact Sheet for Healthcare Providers: https://pope.com/https://www.fda.gov/media/136313/download This test is not yet approved or cleared by the [place] FDA and has been authorized for detection and/or diagnosis of SARS-CoV-2 by FDA under an Emergency Use Authorization (EUA).  This EUA will remain in effect (meaning this test can be used) for the duration of the COVID-19 declaration under Section 564(b)(1) of the Act, 21 U.S.C. section 360bbb-3(b)(1), unless the authorization is terminated or revoked sooner. Performed at [facility], [address] [place], [place] [address]    Dg Chest Port 1 View  Result Date: 08/12/2018 CLINICAL DATA:  Shortness of breath. EXAM: PORTABLE CHEST 1 VIEW COMPARISON:  08/20/2018, additional priors FINDINGS: Unchanged heart size and mediastinal contours. Aortic tortuosity and atherosclerosis. Development of bilateral pleural effusions. Development of prominent bilateral perihilar opacities. No pneumothorax. IMPRESSION: Development of bilateral pleural effusions and perihilar opacities, pulmonary edema versus airspace disease such as pneumonia or aspiration. Significant change in radiograph over the past 2 weeks. Electronically Signed   By: Narda RutherfordMelanie  Sanford M.D.   On: 09/10/2018 20:21    Pending Labs Unresulted Labs (From admission, onward)    Start     Ordered   09/02/2018 2002  Lactic acid, plasma  Now then every 2 hours,   STAT     08/20/2018 2001   09/10/2018 2002  Blood Culture (routine x 2)  BLOOD CULTURE X 2,   STAT     08/29/2018 2001   08/20/2018 2002  Urine culture  ONCE - STAT,   STAT     08/15/2018 2001   Signed and Held  CBC   (enoxaparin (LOVENOX)    CrCl >/= 30 ml/min)  Once,   R    Comments: Baseline for enoxaparin therapy IF NOT ALREADY DRAWN.  Notify MD if PLT < 100 K.    Signed and Held   Signed and Held  Creatinine, serum  (enoxaparin (LOVENOX)    CrCl >/= 30 ml/min)  Once,   R    Comments: Baseline for enoxaparin therapy IF NOT ALREADY DRAWN.    Signed and Held   Signed and Held  Creatinine, serum  (enoxaparin (LOVENOX)    CrCl >/= 30 ml/min)  Weekly,   R    Comments: while on enoxaparin therapy    Signed and Held   Signed and Held  Basic metabolic panel  Tomorrow morning,   R     Signed and Held   Signed and Held  CBC  Tomorrow morning,   R     Signed and Held  Vitals/Pain Today's Vitals   January 28, 2019 2200 January 28, 2019 2215 January 28, 2019 2230 January 28, 2019 2300  BP: (!) 86/56  94/66 (!) 70/52  Pulse: (!) 117 (!) 116 94 (!) 107  Resp: (!) 41 (!) 27 (!) 33 (!) 38  SpO2: 98% 95% 100% 98%  Weight:      Height:        Isolation Precautions No active isolations  Medications Medications  vancomycin (VANCOCIN) IVPB 750 mg/150 ml premix (has no administration in time range)  ceFEPIme (MAXIPIME) 2 g in sodium chloride 0.9 % 100 mL IVPB (has no administration in time range)  norepinephrine (LEVOPHED) 4mg  in 250mL premix infusion (has no administration in time range)  albumin human 25 % solution 25 g (has no administration in time range)  sodium chloride 0.9 % bolus 1,000 mL (0 mLs Intravenous Stopped 12-17-2018 2111)    And  sodium chloride 0.9 % bolus 1,000 mL (0 mLs Intravenous Stopped 12-17-2018 2143)    And  sodium chloride 0.9 % bolus 500 mL (0 mLs Intravenous Stopped 12-17-2018 2227)  vancomycin (VANCOCIN) IVPB 1000 mg/200 mL premix (0 mg Intravenous Stopped 12-17-2018 2143)  ceFEPIme (MAXIPIME) 1 g in sodium chloride 0.9 % 100 mL IVPB (0 g Intravenous Stopped 12-17-2018 2111)    Mobility non-ambulatory High fall risk   Focused Assessments n/a   R Recommendations: See Admitting Provider  Note  Report given to:   Additional Notes: sacral decub, dressing changed in ER, have wound care consult

## 2018-09-03 NOTE — Consult Note (Addendum)
Pharmacy Antibiotic Note  Spencer Edwards is a 78 y.o. male admitted on 09/02/2018 with sepsis.  Pharmacy has been consulted for Cefepime and Vancomcyin dosing.   Plan: Patient received vancomycin 1g IV x 1 dose in ED. Will order an additional 750mg  IV x 1 dose for a total loading dose of 1750mg .  Start Vancomycin 750 mg IV Q 24 hrs. Goal AUC 400-550. Expected AUC: 547 SCr used: 1.41  Start Cefepime 2g IV every 12 hours    Height: 5\' 4"  (162.6 cm) Weight: 177 lb 14.6 oz (80.7 kg) IBW/kg (Calculated) : 59.2  No data recorded.  Recent Labs  Lab 08/28/18 0435 08/29/2018 2002  WBC 9.5 17.7*  CREATININE 1.16 1.41*  LATICACIDVEN  --  3.6*    Estimated Creatinine Clearance: 40.7 mL/min (A) (by C-G formula based on SCr of 1.41 mg/dL (H)).    No Known Allergies  Antimicrobials this admission: 7/23 vancomyicn >>  7/23 cefepime >>   Microbiology results: 7/23 BCx: pending 7/23 UCx: pending 7/23 SARS SARS Coronavirus 2: negative    Thank you for allowing pharmacy to be a part of this patient's care.  Pernell Dupre, PharmD, BCPS Clinical Pharmacist 08/24/2018 11:50 PM

## 2018-09-03 NOTE — ED Notes (Signed)
Fluid boluses complete. Pt blood pressure 78/54. MD aware. Verbal order for 1L normal saline bolus. 1L normal saline bolus administered at this time.

## 2018-09-03 NOTE — H&P (Addendum)
[facility] [place] NAME: Spencer Edwards    MR#:  [id number]  DATE OF BIRTH:  Jul 03, 1940  DATE OF ADMISSION:  02-Oct-2018  PRIMARY CARE PHYSICIAN: Patient, No Pcp Per   REQUESTING/REFERRING PHYSICIAN: Archie Balboa, MD  CHIEF COMPLAINT:   Chief Complaint  Patient presents with  . Aspiration    HISTORY OF PRESENT ILLNESS:  Spencer Edwards  is a 78 y.o. male who presents with chief complaint as above.  Patient presents to the ED tachycardic and febrile, with shortness of breath.  He is somewhat lethargic and is unable to contribute information to his HPI.  Here he was found on chest imaging to have pneumonia.  He meets sepsis criteria.  He was given fluid resuscitation in the ED, but his blood pressure continued to fall.  He was placed on pressors.  Hospitalist were called for admission  PAST MEDICAL HISTORY:   Past Medical History:  Diagnosis Date  . Acute kidney failure ([employer])   . Anxiety disorder   . Coronary artery disease    Non-ST elevation myocardial infarction in November 2015 in the setting of septic shock and multiorgan failure. Peak troponin was 25. Echo showed normal LV systolic function with inferior and posterior hypokinesis.  . Diabetes mellitus without complication ([place])   . Dysphagia   . Gastro-esophageal reflux   . Hematuria   . Hyperlipidemia   . Hypernatremia   . Hypertension   . MI (myocardial infarction) ([place])   . Muscle weakness   . PVD (peripheral vascular disease) ([place])   . Rhabdomyolysis   . Urinary retention      PAST SURGICAL HISTORY:   Past Surgical History:  Procedure Laterality Date  . ABDOMINAL SURGERY    . 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 leg amputation       SOCIAL HISTORY:   Social History   Tobacco Use  . Smoking status: Former Smoker    Types:  Cigarettes  . Smokeless tobacco: Never Used  Substance Use Topics  . Alcohol use: No     FAMILY HISTORY:   Family History  Problem Relation Age of Onset  . Heart Problems Father   . Heart disease Father      DRUG ALLERGIES:  No Known Allergies  MEDICATIONS AT HOME:   Prior to Admission medications   Medication Sig Start Date End Date Taking? Authorizing Provider  ALPRAZolam (XANAX) 0.25 MG tablet Take 0.25 mg by mouth 3 (three) times daily.    [provider]  Amino Acids-Protein Hydrolys (FEEDING SUPPLEMENT, PRO-STAT SUGAR FREE 64,) LIQD Take 30 mLs by mouth 2 (two) times daily.    [provider]  aspirin EC 81 MG tablet Take 81 mg by mouth daily.     [provider]  atorvastatin (LIPITOR) 80 MG tablet Take 80 mg by mouth daily.    [provider]  bisacodyl (DULCOLAX) 10 MG suppository Place 10 mg rectally as needed for moderate constipation.    [provider]  ciprofloxacin (CIPRO) 500 MG tablet Take 1 tablet (500 mg total) by mouth 2 (two) times daily for 4 days. 08/31/18 09/04/18  Fritzi Mandes, MD  clopidogrel (PLAVIX) 75 MG tablet Take 75 mg by mouth daily.    [provider]  famotidine (PEPCID) 20 MG tablet Take 20 mg by mouth daily.  [provider]  insulin aspart (NOVOLOG) 100 UNIT/ML injection Inject 7 Units into the skin 3 (three) times daily with meals. 08/31/18   Enedina FinnerPatel, Sona, MD  insulin glargine (LANTUS) 100 UNIT/ML injection Inject 24 Units into the skin at bedtime.    [provider]  ipratropium-albuterol (DUONEB) 0.5-2.5 (3) MG/3ML SOLN Take 3 mLs by nebulization every 4 (four) hours as needed.    [provider]  metoprolol tartrate (LOPRESSOR) 25 MG tablet Take 12.5 mg by mouth 2 (two) times daily.    [provider]  metroNIDAZOLE (FLAGYL) 500 MG tablet Take 1 tablet (500 mg total) by mouth every 8 (eight) hours for 4 days. 08/31/18 09/04/18  Enedina FinnerPatel, Sona, MD   oxyCODONE-acetaminophen (PERCOCET/ROXICET) 5-325 MG tablet Take 1-2 tablets by mouth every 4 (four) hours as needed for moderate pain or severe pain. 08/31/18   Enedina FinnerPatel, Sona, MD  pantoprazole (PROTONIX) 40 MG tablet Take 1 tablet (40 mg total) by mouth daily. 09/01/18   Enedina FinnerPatel, Sona, MD  promethazine (PHENERGAN) 25 MG tablet Take 25 mg by mouth every 8 (eight) hours as needed for nausea or vomiting.    [provider]  sennosides (SENOKOT) 8.8 MG/5ML syrup Take 5 mLs by mouth every 12 (twelve) hours.    [provider]  tamsulosin (FLOMAX) 0.4 MG CAPS capsule Take 0.4 mg by mouth daily.    [provider]    REVIEW OF SYSTEMS:  Review of Systems  Unable to perform ROS: Acuity of condition     VITAL SIGNS:   Vitals:   10/25/18 2145 10/25/18 2200 10/25/18 2215 10/25/18 2230  BP:  (!) 86/56  94/66  Pulse: (!) 121 (!) 117 (!) 116 94  Resp: (!) 41 (!) 41 (!) 27 (!) 33  SpO2: 96% 98% 95% 100%  Weight:      Height:       Wt Readings from Last 3 Encounters:  10/25/18 80.7 kg  09/02/18 80.7 kg  08/28/18 83 kg    PHYSICAL EXAMINATION:  Physical Exam  Vitals reviewed. Constitutional: He appears well-developed and well-nourished. No distress.  HENT:  Head: Normocephalic and atraumatic.  Mouth/Throat: Oropharynx is clear and moist.  Eyes: Pupils are equal, round, and reactive to light. Conjunctivae and EOM are normal. No scleral icterus.  Neck: Normal range of motion. Neck supple. No JVD present. No thyromegaly present.  Cardiovascular: Regular rhythm and intact distal pulses. Exam reveals no gallop and no friction rub.  No murmur heard. Tachycardic  Respiratory: He is in respiratory distress. He has no wheezes. He has no rales.  Coarse bilateral breath sounds  GI: Soft. Bowel sounds are normal. He exhibits no distension. There is no abdominal tenderness.  Musculoskeletal: Normal range of motion.        General: No edema.     Comments: No arthritis, no gout,  lower extremity amputation  Lymphadenopathy:    He has no cervical adenopathy.  Neurological:  Unable to assess due to patient condition  Skin: Skin is warm and dry. No rash noted. No erythema.  Psychiatric:  Unable to assess due to patient condition    LABORATORY PANEL:   CBC Recent Labs  Lab 10/25/18 2002  WBC 17.7*  HGB 11.3*  HCT 37.8*  PLT 409*   ------------------------------------------------------------------------------------------------------------------  Chemistries  Recent Labs  Lab 08/28/18 0435 10/25/18 2002  NA 148* 149*  K 3.7 3.8  CL 117* 116*  CO2 24 23  GLUCOSE 260* 112*  BUN 23 25*  CREATININE  1.16 1.41*  CALCIUM 8.2* 8.3*  MG 2.3  --   AST  --  24  ALT  --  14  ALKPHOS  --  74  BILITOT  --  0.7   ------------------------------------------------------------------------------------------------------------------  Cardiac Enzymes No results for input(s): TROPONINI in the last 168 hours. ------------------------------------------------------------------------------------------------------------------  RADIOLOGY:  Dg Chest Port 1 View  Result Date: 2018/12/28 CLINICAL DATA:  Shortness of breath. EXAM: PORTABLE CHEST 1 VIEW COMPARISON:  08/20/2018, additional priors FINDINGS: Unchanged heart size and mediastinal contours. Aortic tortuosity and atherosclerosis. Development of bilateral pleural effusions. Development of prominent bilateral perihilar opacities. No pneumothorax. IMPRESSION: Development of bilateral pleural effusions and perihilar opacities, pulmonary edema versus airspace disease such as pneumonia or aspiration. Significant change in radiograph over the past 2 weeks. Electronically Signed   By: Narda RutherfordMelanie  Sanford M.D.   On: 02020/11/16 20:21    EKG:   Orders placed or performed during the hospital encounter of 06-22-2018  . ED EKG 12-Lead  . ED EKG 12-Lead    IMPRESSION AND PLAN:  Principal Problem:   Septic shock (HCC) -sepsis is  due to pneumonia as below.  IV antibiotics given.  Patient's lactic acid was initially elevated.  IV fluids administered, we will recheck lactic acid until within normal limits.  Patient's blood pressure was initially low and normotensive, but despite IV fluid administration subsequently fell.  Patient was placed on IV pressors, will admit to ICU.  Cultures sent from the ED Active Problems:   HCAP (healthcare-associated pneumonia) -IV antibiotics and other supportive treatment as above.  Patient's oxygen saturations are good on nonrebreather   Coronary artery disease -continue home meds   Hypertension -hold antihypertensives due to shock   Diabetes (HCC) -sliding scale insulin coverage   Hyperlipidemia -Home dose antilipid   GERD (gastroesophageal reflux disease) -home dose PPI  Chart review performed and case discussed with ED provider. Labs, imaging and/or ECG reviewed by provider and discussed with patient/family. Management plans discussed with the patient and/or family.  COVID-19 status: Tested negative     DVT PROPHYLAXIS: SubQ lovenox   GI PROPHYLAXIS:  PPI   ADMISSION STATUS: Inpatient     CODE STATUS: DNR Code Status History    Date Active Date Inactive Code Status Order ID Comments User Context   08/20/2018 1728 08/31/2018 2319 DNR [id number]  Alford HighlandWieting, [place], MD ED   Advance Care Planning Activity    Questions for Most Recent Historical Code Status (Order [id number])    Question Answer Comment   In the event of cardiac or respiratory ARREST Do not call a "code blue"    In the event of cardiac or respiratory ARREST Do not perform Intubation, CPR, defibrillation or ACLS    In the event of cardiac or respiratory ARREST Use medication by any route, position, wound care, and other measures to relive pain and suffering. May use oxygen, suction and manual treatment of airway obstruction as needed for comfort.    Comments nurse may pronounce         Advance Directive Documentation      Most Recent Value  Type of Advance Directive  Out of facility DNR (pink MOST or yellow form)  Pre-existing out of facility DNR order (yellow form or pink MOST form)  Yellow form placed in chart (order not valid for inpatient use)  "MOST" Form in Place?  -      TOTAL CRITICAL CARE TIME TAKING CARE OF THIS PATIENT: 50 minutes.   This patient was evaluated in  the context of the global COVID-19 pandemic, which necessitated consideration that the patient might be at risk for infection with the SARS-CoV-2 virus that causes COVID-19. Institutional protocols and algorithms that pertain to the evaluation of patients at risk for COVID-19 are in a state of rapid change based on information released by regulatory bodies including the CDC and federal and state organizations. These policies and algorithms were followed to the best of this provider's knowledge to date during the patient's care at this facility.  Barney DrainDavid F Aiyanna Awtrey 04-12-18, 11:12 PM  [facility]  Office  [phone]  CC: Primary care physician; Patient, No Pcp Per  Note:  This document was prepared using Dragon voice recognition software and may include unintentional dictation errors.

## 2018-09-03 NOTE — ED Notes (Signed)
Pt had bowel movement in brief. Pt cleaned and new brief applied. New sacral patch applied to pt's unstagable pressure wound on sacrum area. Condom cath applied to patient. Pt repositioned in bed. This RN will continue to monitor.

## 2018-09-04 ENCOUNTER — Inpatient Hospital Stay: Payer: Medicare Other

## 2018-09-04 LAB — BASIC METABOLIC PANEL WITH GFR
Anion gap: 8 (ref 5–15)
BUN: 29 mg/dL — ABNORMAL HIGH (ref 8–23)
CO2: 20 mmol/L — ABNORMAL LOW (ref 22–32)
Calcium: 7.5 mg/dL — ABNORMAL LOW (ref 8.9–10.3)
Chloride: 120 mmol/L — ABNORMAL HIGH (ref 98–111)
Creatinine, Ser: 1.61 mg/dL — ABNORMAL HIGH (ref 0.61–1.24)
GFR calc Af Amer: 46 mL/min — ABNORMAL LOW
GFR calc non Af Amer: 40 mL/min — ABNORMAL LOW
Glucose, Bld: 250 mg/dL — ABNORMAL HIGH (ref 70–99)
Potassium: 4.8 mmol/L (ref 3.5–5.1)
Sodium: 148 mmol/L — ABNORMAL HIGH (ref 135–145)

## 2018-09-04 LAB — COMPREHENSIVE METABOLIC PANEL
ALT: 374 U/L — ABNORMAL HIGH (ref 0–44)
AST: 1194 U/L — ABNORMAL HIGH (ref 15–41)
Albumin: 2.6 g/dL — ABNORMAL LOW (ref 3.5–5.0)
Alkaline Phosphatase: 63 U/L (ref 38–126)
Anion gap: 16 — ABNORMAL HIGH (ref 5–15)
BUN: 46 mg/dL — ABNORMAL HIGH (ref 8–23)
CO2: 13 mmol/L — ABNORMAL LOW (ref 22–32)
Calcium: 7.4 mg/dL — ABNORMAL LOW (ref 8.9–10.3)
Chloride: 118 mmol/L — ABNORMAL HIGH (ref 98–111)
Creatinine, Ser: 2.31 mg/dL — ABNORMAL HIGH (ref 0.61–1.24)
GFR calc Af Amer: 30 mL/min — ABNORMAL LOW (ref >60)
GFR calc non Af Amer: 26 mL/min — ABNORMAL LOW (ref >60)
Glucose, Bld: 338 mg/dL — ABNORMAL HIGH (ref 70–99)
Potassium: 6.3 mmol/L (ref 3.5–5.1)
Sodium: 147 mmol/L — ABNORMAL HIGH (ref 135–145)
Total Bilirubin: 1.4 mg/dL — ABNORMAL HIGH (ref 0.3–1.2)
Total Protein: 5.6 g/dL — ABNORMAL LOW (ref 6.5–8.1)

## 2018-09-04 LAB — CBC
HCT: 33.1 % — ABNORMAL LOW (ref 39.0–52.0)
HCT: 32.2 % — ABNORMAL LOW (ref 39.0–52.0)
Hemoglobin: 9.8 g/dL — ABNORMAL LOW (ref 13.0–17.0)
Hemoglobin: 9.2 g/dL — ABNORMAL LOW (ref 13.0–17.0)
MCH: 27.1 pg (ref 26.0–34.0)
MCH: 27.2 pg (ref 26.0–34.0)
MCHC: 29.6 g/dL — ABNORMAL LOW (ref 30.0–36.0)
MCHC: 28.6 g/dL — ABNORMAL LOW (ref 30.0–36.0)
MCV: 91.7 fL (ref 80.0–100.0)
MCV: 95.3 fL (ref 80.0–100.0)
Platelets: 418 10*3/uL — ABNORMAL HIGH (ref 150–400)
Platelets: 407 10*3/uL — ABNORMAL HIGH (ref 150–400)
RBC: 3.61 MIL/uL — ABNORMAL LOW (ref 4.22–5.81)
RBC: 3.38 MIL/uL — ABNORMAL LOW (ref 4.22–5.81)
RDW: 15.9 % — ABNORMAL HIGH (ref 11.5–15.5)
RDW: 16.1 % — ABNORMAL HIGH (ref 11.5–15.5)
WBC: 32.6 10*3/uL — ABNORMAL HIGH (ref 4.0–10.5)
WBC: 24.5 10*3/uL — ABNORMAL HIGH (ref 4.0–10.5)
nRBC: 0 % (ref 0.0–0.2)
nRBC: 0.2 % (ref 0.0–0.2)

## 2018-09-04 LAB — MAGNESIUM
Magnesium: 2.4 mg/dL (ref 1.7–2.4)
Magnesium: 2.6 mg/dL — ABNORMAL HIGH (ref 1.7–2.4)

## 2018-09-04 LAB — GLUCOSE, CAPILLARY
Glucose-Capillary: 169 mg/dL — ABNORMAL HIGH (ref 70–99)
Glucose-Capillary: 297 mg/dL — ABNORMAL HIGH (ref 70–99)
Glucose-Capillary: 285 mg/dL — ABNORMAL HIGH (ref 70–99)

## 2018-09-04 LAB — LACTIC ACID, PLASMA
Lactic Acid, Venous: 2.5 mmol/L (ref 0.5–1.9)
Lactic Acid, Venous: 7.3 mmol/L (ref 0.5–1.9)

## 2018-09-04 LAB — PHOSPHORUS: Phosphorus: 9.3 mg/dL — ABNORMAL HIGH (ref 2.5–4.6)

## 2018-09-04 LAB — PROTIME-INR
INR: 2.4 — ABNORMAL HIGH (ref 0.8–1.2)
Prothrombin Time: 26.1 seconds — ABNORMAL HIGH (ref 11.4–15.2)

## 2018-09-04 LAB — MRSA PCR SCREENING: MRSA by PCR: POSITIVE — AB

## 2018-09-04 LAB — PROCALCITONIN: Procalcitonin: 17.54 ng/mL

## 2018-09-04 LAB — FIBRINOGEN: Fibrinogen: 515 mg/dL — ABNORMAL HIGH (ref 210–475)

## 2018-09-04 MED ORDER — ASPIRIN EC 81 MG PO TBEC
81.0000 mg | DELAYED_RELEASE_TABLET | Freq: Every day | ORAL | Status: DC
  Administered 2018-09-04: 11:00:00 81 mg via ORAL
  Filled 2018-09-04: qty 1

## 2018-09-04 MED ORDER — METHYLPREDNISOLONE SODIUM SUCC 40 MG IJ SOLR
40.0000 mg | Freq: Two times a day (BID) | INTRAMUSCULAR | Status: DC
  Administered 2018-09-04 – 2018-09-05 (×3): 40 mg via INTRAVENOUS
  Filled 2018-09-04 (×3): qty 1

## 2018-09-04 MED ORDER — DEXTROSE 50 % IV SOLN
50.0000 mL | Freq: Once | INTRAVENOUS | Status: AC
  Administered 2018-09-05: 50 mL via INTRAVENOUS
  Filled 2018-09-04: qty 50

## 2018-09-04 MED ORDER — SODIUM CHLORIDE 0.9 % IV SOLN
INTRAVENOUS | Status: DC
  Administered 2018-09-04: 02:00:00 via INTRAVENOUS

## 2018-09-04 MED ORDER — VANCOMYCIN VARIABLE DOSE PER UNSTABLE RENAL FUNCTION (PHARMACIST DOSING): Status: DC

## 2018-09-04 MED ORDER — CHLORHEXIDINE GLUCONATE CLOTH 2 % EX PADS
6.0000 | MEDICATED_PAD | Freq: Every day | CUTANEOUS | Status: DC
  Administered 2018-09-04 – 2018-09-06 (×3): 6 via TOPICAL
  Filled 2018-09-04: qty 6

## 2018-09-04 MED ORDER — ACETAMINOPHEN 325 MG PO TABS: 650.0000 mg | ORAL_TABLET | Freq: Four times a day (QID) | ORAL | Status: DC | PRN

## 2018-09-04 MED ORDER — BENZONATATE 100 MG PO CAPS: 200.0000 mg | ORAL_CAPSULE | Freq: Three times a day (TID) | ORAL | Status: DC | PRN

## 2018-09-04 MED ORDER — PANTOPRAZOLE SODIUM 40 MG PO TBEC: 40.0000 mg | DELAYED_RELEASE_TABLET | Freq: Every day | ORAL | Status: DC

## 2018-09-04 MED ORDER — INSULIN ASPART 100 UNIT/ML ~~LOC~~ SOLN
0.0000 [IU] | SUBCUTANEOUS | Status: DC
  Administered 2018-09-05: 7 [IU] via SUBCUTANEOUS
  Administered 2018-09-05: 2 [IU] via SUBCUTANEOUS
  Filled 2018-09-04 (×2): qty 1

## 2018-09-04 MED ORDER — FUROSEMIDE 10 MG/ML IJ SOLN
20.0000 mg | Freq: Once | INTRAMUSCULAR | Status: AC
  Administered 2018-09-04: 13:00:00 20 mg via INTRAVENOUS
  Filled 2018-09-04: qty 2

## 2018-09-04 MED ORDER — LACTATED RINGERS IV SOLN
INTRAVENOUS | Status: DC
  Administered 2018-09-04: 75 mL/h via INTRAVENOUS
  Administered 2018-09-04 – 2018-09-05 (×2): via INTRAVENOUS

## 2018-09-04 MED ORDER — SODIUM CHLORIDE 0.9 % IV SOLN
2.0000 g | INTRAVENOUS | Status: DC
  Administered 2018-09-05: 2 g via INTRAVENOUS
  Filled 2018-09-04 (×2): qty 2

## 2018-09-04 MED ORDER — CLOPIDOGREL BISULFATE 75 MG PO TABS
75.0000 mg | ORAL_TABLET | Freq: Every day | ORAL | Status: DC
  Administered 2018-09-04: 11:00:00 75 mg via ORAL
  Filled 2018-09-04: qty 1

## 2018-09-04 MED ORDER — ENOXAPARIN SODIUM 40 MG/0.4ML ~~LOC~~ SOLN
40.0000 mg | SUBCUTANEOUS | Status: DC
  Administered 2018-09-04: 40 mg via SUBCUTANEOUS
  Filled 2018-09-04: qty 0.4

## 2018-09-04 MED ORDER — SODIUM BICARBONATE 8.4 % IV SOLN
100.0000 meq | Freq: Once | INTRAVENOUS | Status: AC
  Administered 2018-09-05: 100 meq via INTRAVENOUS
  Filled 2018-09-04: qty 50

## 2018-09-04 MED ORDER — ONDANSETRON HCL 4 MG PO TABS: 4.0000 mg | ORAL_TABLET | Freq: Four times a day (QID) | ORAL | Status: DC | PRN

## 2018-09-04 MED ORDER — ATORVASTATIN CALCIUM 20 MG PO TABS
80.0000 mg | ORAL_TABLET | Freq: Every day | ORAL | Status: DC
  Administered 2018-09-04: 11:00:00 80 mg via ORAL
  Filled 2018-09-04: qty 4

## 2018-09-04 MED ORDER — METOPROLOL TARTRATE 25 MG PO TABS: 12.5000 mg | ORAL_TABLET | Freq: Two times a day (BID) | ORAL | Status: DC

## 2018-09-04 MED ORDER — GLYCOPYRROLATE 0.2 MG/ML IJ SOLN
0.1000 mg | Freq: Once | INTRAMUSCULAR | Status: AC
  Administered 2018-09-04: 08:00:00 0.1 mg via INTRAVENOUS
  Filled 2018-09-04: qty 1

## 2018-09-04 MED ORDER — NOREPINEPHRINE BITARTRATE 1 MG/ML IV SOLN
0.0000 ug/min | INTRAVENOUS | Status: DC
  Administered 2018-09-04: 30 ug/min via INTRAVENOUS
  Administered 2018-09-05: 24 ug/min via INTRAVENOUS
  Administered 2018-09-05: 28 ug/min via INTRAVENOUS
  Administered 2018-09-05: 30 ug/min via INTRAVENOUS
  Filled 2018-09-04 (×4): qty 4

## 2018-09-04 MED ORDER — STERILE WATER FOR INJECTION IV SOLN
INTRAVENOUS | Status: DC
  Administered 2018-09-05: via INTRAVENOUS
  Filled 2018-09-04 (×5): qty 850

## 2018-09-04 MED ORDER — ALBUMIN HUMAN 25 % IV SOLN
25.0000 g | Freq: Once | INTRAVENOUS | Status: DC
  Filled 2018-09-04: qty 50

## 2018-09-04 MED ORDER — ONDANSETRON HCL 4 MG/2ML IJ SOLN: 4.0000 mg | Freq: Four times a day (QID) | INTRAMUSCULAR | Status: DC | PRN

## 2018-09-04 MED ORDER — VANCOMYCIN HCL 1.25 G IV SOLR: 1250.0000 mg | INTRAVENOUS | Status: DC

## 2018-09-04 MED ORDER — LEVALBUTEROL HCL 1.25 MG/0.5ML IN NEBU: 1.2500 mg | INHALATION_SOLUTION | Freq: Four times a day (QID) | RESPIRATORY_TRACT | Status: DC | PRN

## 2018-09-04 MED ORDER — CALCIUM GLUCONATE-NACL 1-0.675 GM/50ML-% IV SOLN
1.0000 g | Freq: Once | INTRAVENOUS | Status: AC
  Administered 2018-09-05: 1000 mg via INTRAVENOUS
  Filled 2018-09-04: qty 50

## 2018-09-04 MED ORDER — VANCOMYCIN HCL IN DEXTROSE 750-5 MG/150ML-% IV SOLN: 750.0000 mg | INTRAVENOUS | Status: DC

## 2018-09-04 MED ORDER — ACETAMINOPHEN 650 MG RE SUPP: 650.0000 mg | Freq: Four times a day (QID) | RECTAL | Status: DC | PRN

## 2018-09-04 MED ORDER — INSULIN ASPART 100 UNIT/ML ~~LOC~~ SOLN: 0.0000 [IU] | Freq: Three times a day (TID) | SUBCUTANEOUS | Status: DC

## 2018-09-04 MED ORDER — INSULIN REGULAR HUMAN 100 UNIT/ML IJ SOLN
10.0000 [IU] | Freq: Once | INTRAMUSCULAR | Status: AC
  Administered 2018-09-05: 10 [IU] via INTRAVENOUS
  Filled 2018-09-04: qty 10

## 2018-09-04 MED ORDER — MORPHINE SULFATE (PF) 2 MG/ML IV SOLN
2.0000 mg | INTRAVENOUS | Status: DC | PRN
  Administered 2018-09-04 – 2018-09-05 (×4): 2 mg via INTRAVENOUS
  Filled 2018-09-04 (×4): qty 1

## 2018-09-04 MED ORDER — GUAIFENESIN-DM 100-10 MG/5ML PO SYRP: 5.0000 mL | ORAL_SOLUTION | ORAL | Status: DC | PRN

## 2018-09-04 MED ORDER — ENOXAPARIN SODIUM 30 MG/0.3ML ~~LOC~~ SOLN
30.0000 mg | SUBCUTANEOUS | Status: DC
  Administered 2018-09-05: 30 mg via SUBCUTANEOUS
  Filled 2018-09-04: qty 0.3

## 2018-09-04 MED ORDER — SODIUM BICARBONATE 8.4 % IV SOLN
50.0000 meq | Freq: Once | INTRAVENOUS | Status: AC
  Administered 2018-09-04: 22:00:00 50 meq via INTRAVENOUS
  Filled 2018-09-04: qty 50

## 2018-09-04 MED ORDER — TAMSULOSIN HCL 0.4 MG PO CAPS
0.4000 mg | ORAL_CAPSULE | Freq: Every day | ORAL | Status: DC
  Administered 2018-09-04: 11:00:00 0.4 mg via ORAL
  Filled 2018-09-04: qty 1

## 2018-09-04 MED ORDER — PANTOPRAZOLE SODIUM 40 MG IV SOLR
40.0000 mg | INTRAVENOUS | Status: DC
  Administered 2018-09-04 – 2018-09-06 (×3): 40 mg via INTRAVENOUS
  Filled 2018-09-04 (×3): qty 40

## 2018-09-04 MED ORDER — INSULIN ASPART 100 UNIT/ML ~~LOC~~ SOLN: 0.0000 [IU] | Freq: Every day | SUBCUTANEOUS | Status: DC

## 2018-09-04 MED ORDER — MUPIROCIN 2 % EX OINT
TOPICAL_OINTMENT | Freq: Two times a day (BID) | CUTANEOUS | Status: DC
  Administered 2018-09-04 (×2): via NASAL
  Administered 2018-09-05: 1 via NASAL
  Administered 2018-09-05 – 2018-09-06 (×2): via NASAL
  Filled 2018-09-04 (×2): qty 22

## 2018-09-04 MED ORDER — LACTATED RINGERS IV BOLUS
2000.0000 mL | Freq: Once | INTRAVENOUS | Status: AC
  Administered 2018-09-04: 2000 mL via INTRAVENOUS

## 2018-09-04 NOTE — Progress Notes (Signed)
1800 After bath and bed change patient turned to left side. Patient unresponsive, dropped his heart rate to 87 and his respiratory rate to 27. Patient did not respond when shakened. Family called. All family out of state and could not come in tonight. Oxygen saturations up and down as well as B/P.  Patient placed on 100% NRB mask. Patient pale but continued to breathe. 1900 Patient still unresponsive but O2 sats have improved.B/P has decreased. See flow sheets.

## 2018-09-04 NOTE — Consult Note (Addendum)
Name: Spencer Edwards MRN: [id number] DOB: 11/27/1940    ADMISSION DATE:  09/09/18 CONSULTATION DATE: 09-09-2018  REFERRING MD : Dr. 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 He presented to [facility] ER on 07/23 from [facility] with tachycardia and tachypnea staff concerned of possible aspiration.  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  Per ER notes during current presentation EMS reported upon their arrival at [facility] pt febrile temp 103.1 F, bp 96/55, and tachycardic with audible crackles.  In the ER lab results revealed Na+ 149, chloride 116, BUN 25, creatinine 1.41, lactic acid 3.6, wbc 17.7, and hgb 11.3.  UA positive for UTI, COVID-19 negative, and CXR concerning for bilateral pneumonia.  He received vancomycin and cefepime. Pt remained hypotensive sbp 80's, he received 2.5 L NS bolus however he remained hypotensive requiring levophed gtt.  He was subsequently admitted to the ICU by hospitalist team for additional workup and treatment.   PAST MEDICAL HISTORY :   has a past medical history of Acute kidney failure ([place]), Anxiety disorder, Coronary artery disease, Diabetes mellitus without complication  ([place]), Dysphagia, Gastro-esophageal reflux, Hematuria, Hyperlipidemia, Hypernatremia, Hypertension, MI (myocardial infarction) ([place]), Muscle weakness, PVD (peripheral vascular disease) ([place]), Rhabdomyolysis, and Urinary retention.  has a past surgical history that includes leg amputation; Abdominal surgery; and Amputation (Left, 08/28/2018). Prior to Admission medications   Medication Sig Start Date End Date Taking? Authorizing Provider  ALPRAZolam (XANAX) 0.25 MG tablet Take 0.25 mg by mouth 3 (three) times daily.    [provider]  Amino Acids-Protein Hydrolys (FEEDING SUPPLEMENT, PRO-STAT SUGAR FREE 64,) LIQD Take 30 mLs by mouth 2 (two) times daily.    [provider]  aspirin EC 81 MG tablet Take 81 mg by mouth daily.     [provider]  atorvastatin (LIPITOR) 80 MG tablet Take 80 mg by mouth daily.    [provider]  bisacodyl (DULCOLAX) 10 MG suppository Place 10 mg rectally as needed for moderate constipation.    [provider]  ciprofloxacin (CIPRO) 500 MG tablet Take 1 tablet (500 mg total) by mouth 2 (two) times daily for 4 days. 08/31/18 09/04/18  Fritzi Mandes, MD  clopidogrel (PLAVIX) 75 MG tablet Take 75 mg by mouth daily.    [provider]  famotidine (PEPCID) 20 MG tablet Take 20 mg by mouth daily.    [provider]  insulin aspart (NOVOLOG) 100 UNIT/ML injection Inject 7 Units into the skin 3 (three) times daily with meals. 08/31/18   Fritzi Mandes, MD  insulin glargine (LANTUS) 100 UNIT/ML injection Inject 24 Units into the skin at bedtime.    [provider]  ipratropium-albuterol (DUONEB) 0.5-2.5 (3) MG/3ML SOLN Take 3 mLs by nebulization every 4 (four) hours as needed.    [provider]  metoprolol tartrate (LOPRESSOR) 25 MG tablet Take 12.5 mg by mouth 2 (two) times daily.    [provider]  metroNIDAZOLE (FLAGYL) 500 MG tablet Take 1 tablet (500 mg total) by mouth every 8 (eight) hours  for 4 days. 08/31/18 09/04/18  Enedina FinnerPatel, Sona, MD  oxyCODONE-acetaminophen (PERCOCET/ROXICET) 5-325 MG tablet Take 1-2 tablets by mouth every 4 (four) hours as needed for moderate pain or severe pain. 08/31/18   Enedina FinnerPatel, Sona, MD  pantoprazole (PROTONIX) 40 MG tablet Take 1 tablet (40 mg total) by mouth daily. 09/01/18   Enedina FinnerPatel, Sona, MD  promethazine (PHENERGAN) 25 MG tablet Take 25 mg by mouth every 8 (eight) hours as needed for nausea or vomiting.    [provider]  sennosides (SENOKOT) 8.8 MG/5ML syrup Take 5 mLs by mouth every 12 (twelve) hours.    [provider]  tamsulosin (FLOMAX) 0.4 MG CAPS capsule Take 0.4 mg by mouth daily.    [provider]   No Known Allergies  FAMILY HISTORY:  family history includes Heart Problems in his father; Heart disease in his father. SOCIAL HISTORY:  reports that he has quit smoking. His smoking use included cigarettes. He has never used smokeless tobacco. He reports that he does not drink alcohol or use drugs.  REVIEW OF SYSTEMS:   Unable to assess pt currently nonverbal in respiratory failure on NRB   SUBJECTIVE:  Unable to assess pt currently nonverbal in respiratory failure on NRB   VITAL SIGNS: Temp:  [96.3 F (35.7 C)] 96.3 F (35.7 C) (07/24 0130) Pulse Rate:  [94-136] 106 (07/24 0200) Resp:  [27-54] 29 (07/24 0200) BP: (70-111)/(52-78) 85/63 (07/24 0200) SpO2:  [92 %-100 %] 100 % (07/24 0200) Weight:  [80.7 kg] 80.7 kg (07/23 1950)  PHYSICAL EXAMINATION: General: chronically ill appearing elderly male, in mild respiratory distress on NRB  Neuro: nonverbal at this time, follows commands, PERRL  HEENT: mild JVD, supple Cardiovascular: sinus tach, no R/G  Lungs: diffuse rhonchi throughout and tachypneic  Abdomen: +BS x4, soft, non tender, non distended  Musculoskeletal: bilateral AKA, 2+ generalized edema   Skin: unstageable sacral spine pressure injury and left back, 2 small stage III pressure ulcers on right back   Recent Labs  Lab 08/28/18 0435 09/08/2018 2002  NA 148* 149*  K 3.7 3.8  CL 117* 116*  CO2 24 23  BUN 23 25*  CREATININE 1.16 1.41*  GLUCOSE 260* 112*   Recent Labs  Lab 08/28/18 0435 08/13/2018 2002  HGB 12.5* 11.3*  HCT 40.1 37.8*  WBC 9.5 17.7*  PLT 262 409*   Dg Chest Port 1 View  Result Date: 09/04/2018 CLINICAL DATA:  Shortness of breath. EXAM: PORTABLE CHEST 1 VIEW COMPARISON:  08/20/2018, additional priors FINDINGS: Unchanged heart size and mediastinal contours. Aortic tortuosity and atherosclerosis. Development of bilateral pleural effusions. Development of prominent bilateral perihilar opacities. No pneumothorax. IMPRESSION: Development of bilateral pleural effusions and perihilar opacities, pulmonary edema versus airspace disease such as pneumonia or aspiration. Significant change in radiograph over the past 2 weeks. Electronically Signed   By: Narda RutherfordMelanie  Sanford M.D.   On: 08/27/2018 20:21    ASSESSMENT / PLAN:  Acute respiratory failure secondary to bilateral pneumonia  Supplemental O2 for dyspnea and/or hypoxia  Prn bronchodilator therapy  IV steroids wean as tolerated  Prn nasotracheal suctioning   Septic shock secondary to bilateral pneumonia and UTI  Hx: HTN, Hyperlipidemia, MI, and CAD  Continuous telemetry monitoring  Prn levophed gtt to maintain map >65  Hold outpatient metoprolol  Once pt able to tolerate po's resume outpatient aspirin, atorvastatin, and clopidogrel   Acute renal failure secondary to septic shock  Hypernatremia  Lactic acidosis  Trend BMP  Replace electrolytes as indicated  Monitor UOP Avoid nephrotoxic medications  Discontinue NS and start LR @75  ml/hr   Leukocytosis secondary to UTI and bilateral pneumonia  ([facility] treatment of morganella and coag neg staph bacteriemia and 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-08/31/2018) Trend WBC and monitor fever curve  Trend PCT  Follow cultures   Continue cefepime and vancomycin   Pressure Ulcers  Turn q2hrs   Anemia without obvious acute blood loss VTE px: subq lovenox  Trend CBC and monitor for s/sx of bleeding  Transfuse for hgb <7  Type II Diabetes Mellitus CBG's q4hrs  SSI   GERD  Continue protonix   Will keep NPO for now until mentation and respiratory status improves due to HIGH risk for aspiration    Sonda Rumbleana , AGNP  Pulmonary/Critical Care Pager [phone] (please enter 7 digits) PCCM Consult Pager [phone] (please enter 7 digits)

## 2018-09-04 NOTE — Procedures (Signed)
Central Venous Catheter Placement:TRIPLE LUMEN Indication: Patient receiving vesicant or irritant drug.; Patient receiving intravenous therapy for longer than 5 days.; Patient has limited or no vascular access.   Consent:emergent    Hand washing performed prior to starting the procedure.   Procedure:   An active timeout was performed and correct patient, name, & ID confirmed.   Patient was positioned correctly for central venous access.  Patient was prepped using strict sterile technique including chlorohexadine preps, sterile drape, sterile gown and sterile gloves.    The area was prepped, draped and anesthetized in the usual sterile manner. Patient comfort was obtained.    A triple lumen catheter was placed in R IJ Internal Jugular Vein There was good blood return, catheter caps were placed on lumens, catheter flushed easily, the line was secured and a sterile dressing and BIO-PATCH applied.   Ultrasound was used to visualize vasculature and guidance of needle.   Number of Attempts: 1 Complications:none Estimated Blood Loss: none Chest Radiograph indicated and ordered.      Ottie Glazier, M.D.  Pulmonary & [place]

## 2018-09-04 NOTE — ED Notes (Signed)
Attempt to call report. Nurse in [place]. Charge RN states no other RN available for report. ED Charge notified.

## 2018-09-04 NOTE — Consult Note (Addendum)
Pharmacy Antibiotic Note  Spencer Edwards is a 78 y.o. male admitted on 08/18/2018 with sepsis.  Pharmacy has been consulted for Cefepime and Vancomcyin dosing.   Patient received total loading dose of 1750mg  IV.  Plan: Scr increased from 1.41>> 1.61. Will adjust vancomycin dose.    Start Vancomycin 1250 mg IV Q 48 hrs. Goal AUC 400-550. Expected AUC: 511 SCr used: 1.61  Continue Cefepime 2g IV every 12 hours    Height: 5\' 4"  (162.6 cm) Weight: 177 lb 14.6 oz (80.7 kg) IBW/kg (Calculated) : 59.2  Temp (24hrs), Avg:96.3 F (35.7 C), Min:96.3 F (35.7 C), Max:96.3 F (35.7 C)  Recent Labs  Lab 08/28/18 0435 08/23/2018 2002 08/12/2018 2300 09/04/18 0306  WBC 9.5 17.7*  --  32.6*  CREATININE 1.16 1.41*  --  1.61*  LATICACIDVEN  --  3.6* 2.2* 2.5*    Estimated Creatinine Clearance: 35.7 mL/min (A) (by C-G formula based on SCr of 1.61 mg/dL (H)).    No Known Allergies  Antimicrobials this admission: 7/23 vancomyicn >>  7/23 cefepime >>   Microbiology results: 7/23 BCx: pending 7/23 UCx: pending 7/23 SARS SARS Coronavirus 2: negative   7/24 MRSA PCR: positive   Thank you for allowing pharmacy to be a part of this patient's care.  Pernell Dupre, PharmD, BCPS Clinical Pharmacist 09/04/2018 3:48 AM

## 2018-09-04 NOTE — Progress Notes (Signed)
Inpatient Diabetes Program Recommendations  AACE/ADA: New Consensus Statement on Inpatient Glycemic Control   Target Ranges:  Prepandial:   less than 140 mg/dL      Peak postprandial:   less than 180 mg/dL (1-2 hours)      Critically ill patients:  140 - 180 mg/dL   Results for Spencer Edwards, Spencer Edwards (MRN [id number]) as of 09/04/2018 12:50  Ref. Range 09/04/2018 01:03 09/04/2018 11:54  Glucose-Capillary Latest Ref Range: 70 - 99 mg/dL 169 (H) 297 (H)  Results for Spencer Edwards, Spencer Edwards (MRN [id number]) as of 09/04/2018 12:50  Ref. Range 08/20/2018 13:14  Hemoglobin A1C Latest Ref Range: 4.8 - 5.6 % 12.4 (H)   Review of Glycemic Control  Diabetes history:DM2 Outpatient Diabetes medications:Lantus 24 units QHS, Novolog 7 units TID with meals Current orders for Inpatient glycemic control:None  Inpatient Diabetes Program Recommendations:  Insulin - Basal: Please consider ordering Lantus to20 units daily.   Insulin-Correction: Please consider ordering CBGs Q4H with Novolog 0-9 units Q4H.  Note: Patient was recently inpatient from 08/20/18 to 08/31/18 and was ordered Lantus 20 units daily, Novolog sensitive correction with meals, Novolog 0-5 units QHS and Novolog 7 units TID with meals for meal coverage. Patient was discharged on 08/31/18 to [facility] facility on Lantus 24 units daily and Novolog 7 units TID with meals for DM control.  Thanks, Barnie Alderman, RN, MSN, CDE Diabetes Coordinator Inpatient Diabetes Program [phone] (Team Pager from 8am to 5pm)

## 2018-09-04 NOTE — Consult Note (Signed)
Pharmacy Antibiotic Note  Spencer Edwards is a 78 y.o. male admitted on 08/31/2018 with sepsis.  Pharmacy has been consulted for Cefepime and Vancomcyin dosing.   Patient received total loading dose of 1750mg  IV.  Plan: Scr increasing from 1.41>> 1.61>> 2.31. Will dose by vancomycin random levels.  Vancomycin random level ordered with AM labs.    Will adjust Cefepime 2g IV every 24 hours   Height: 5\' 4"  (162.6 cm) Weight: 177 lb 14.6 oz (80.7 kg) IBW/kg (Calculated) : 59.2  Temp (24hrs), Avg:98.1 F (36.7 C), Min:96.3 F (35.7 C), Max:99.7 F (37.6 C)  Recent Labs  Lab 08/26/2018 2002 09/02/2018 2300 09/04/18 0306 09/04/18 2126  WBC 17.7*  --  32.6* 24.5*  CREATININE 1.41*  --  1.61* 2.31*  LATICACIDVEN 3.6* 2.2* 2.5* 7.3*    Estimated Creatinine Clearance: 24.9 mL/min (A) (by C-G formula based on SCr of 2.31 mg/dL (H)).    No Known Allergies  Antimicrobials this admission: 7/23 vancomyicn >>  7/23 cefepime >>   Microbiology results: 7/23 BCx: pending 7/23 UCx: pending 7/23 SARS SARS Coronavirus 2: negative   7/24 MRSA PCR: positive   Thank you for allowing pharmacy to be a part of this patient's care.  Pernell Dupre, PharmD, BCPS Clinical Pharmacist 09/04/2018 11:55 PM

## 2018-09-04 NOTE — Progress Notes (Signed)
Cousin called and updated on pt status via phone. No questions at this time.

## 2018-09-04 NOTE — Progress Notes (Signed)
CRITICAL VALUE ALERT  Critical Value:  Lactic 7.3  Date & Time Notied:  09/04/2018 2215  Provider Notified: Jeoffrey Massed, NP  Orders Received/Actions taken: 2L of LR over 2 hours

## 2018-09-04 NOTE — Progress Notes (Signed)
PHARMACIST - PHYSICIAN COMMUNICATION  CONCERNING:  Enoxaparin (Lovenox) for DVT Prophylaxis   RECOMMENDATION: Patient was prescribed enoxaprin 40mg  q24 hours for VTE prophylaxis.   Filed Weights   Sep 04, 2018 1950  Weight: 177 lb 14.6 oz (80.7 kg)    Body mass index is 30.54 kg/m.  Estimated Creatinine Clearance: 24.9 mL/min (A) (by C-G formula based on SCr of 2.31 mg/dL (H)).  Patient is candidate for enoxaparin 30mg  every 24 hours based on CrCl <42ml/min  DESCRIPTION: Pharmacy has adjusted enoxaparin dose per [facility] policy.  Patient is now receiving enoxaparin 30mg  every 24 hours.  Pernell Dupre, PharmD, BCPS Clinical Pharmacist 09/04/2018 11:54 PM

## 2018-09-04 NOTE — Progress Notes (Signed)
[facility] at [place] NAME: Spencer Edwards    MR#:  [id number]  DATE OF BIRTH:  [birth year]  SUBJECTIVE:   Patient states he is feeling a little bit better this morning.  He still is complaining of some shortness of breath.  He endorses cough.  No fevers or chills.  REVIEW OF SYSTEMS:  Review of Systems  Constitutional: Negative for chills and fever.  HENT: Negative for congestion and sore throat.   Eyes: Negative for blurred vision and double vision.  Respiratory: Positive for cough, sputum production and shortness of breath.   Cardiovascular: Negative for chest pain and palpitations.  Gastrointestinal: Negative for nausea and vomiting.  Genitourinary: Negative for dysuria and urgency.  Musculoskeletal: Negative for back pain and neck pain.  Neurological: Negative for dizziness and headaches.  Psychiatric/Behavioral: Negative for depression. The patient is not nervous/anxious.    DRUG ALLERGIES:  No Known Allergies VITALS:  Blood pressure 120/88, pulse (!) 119, temperature 98.2 F (36.8 C), resp. rate (!) 31, height 5\' 4"  (1.626 m), weight 80.7 kg, SpO2 100 %. PHYSICAL EXAMINATION:  Physical Exam  GENERAL:  Laying in the bed with no acute distress.  HEENT: Head atraumatic, normocephalic. Pupils equal, round, reactive to light and accommodation. No scleral icterus. Extraocular muscles intact. Oropharynx and nasopharynx clear.  NECK:  Supple, no jugular venous distention. No thyroid enlargement. LUNGS: + Diffuse rhonchi present, mildly increased work of breathing, nasal cannula in place. CARDIOVASCULAR: Tachycardic, regular rhythm, S1, S2 normal. No murmurs, rubs, or gallops.  ABDOMEN: Soft, nontender, nondistended. Bowel sounds present.  EXTREMITIES: No pedal edema, cyanosis, or clubbing.  NEUROLOGIC: CN 2-12 intact, no focal deficits. 5/5 muscle strength throughout all extremities. Sensation intact throughout. Gait not checked.   PSYCHIATRIC: The patient is alert and oriented x 3.  SKIN: No obvious rash, lesion, or ulcer.  LABORATORY PANEL:  Male CBC Recent Labs  Lab 09/04/18 0306  WBC 32.6*  HGB 9.8*  HCT 33.1*  PLT 418*   ------------------------------------------------------------------------------------------------------------------ Chemistries  Recent Labs  Lab 08/22/2018 0430  09/11/2018 2002 09/04/18 0306  NA  --    < > 149* 148*  K  --    < > 3.8 4.8  CL  --    < > 116* 120*  CO2  --    < > 23 20*  GLUCOSE  --    < > 112* 250*  BUN  --    < > 25* 29*  CREATININE  --    < > 1.41* 1.61*  CALCIUM  --    < > 8.3* 7.5*  MG 2.4  --   --   --   AST  --   --  24  --   ALT  --   --  14  --   ALKPHOS  --   --  74  --   BILITOT  --   --  0.7  --    < > = values in this interval not displayed.   RADIOLOGY:  Dg Chest Port 1 View  Result Date: 09/04/2018 CLINICAL DATA:  Post right IJ central line placement. EXAM: PORTABLE CHEST 1 VIEW COMPARISON:  08/27/2018 FINDINGS: Placement of right IJ central venous catheter which has tip below the cavoatrial junction as this could be pulled back 4-5 cm. Lungs are adequately inflated demonstrate slight interval worsening of moderate bilateral perihilar airspace opacification likely worsening moderate interstitial edema. Multifocal infection is less likely. No pneumothorax.  Small bilateral pleural effusions without significant change. Stable cardiomegaly. Remainder of the exam is unchanged. IMPRESSION: Slight interval worsening of moderate bilateral perihilar airspace opacification likely moderate interstitial edema and less likely infection. Stable small bilateral pleural effusions. Right IJ central venous catheter with tip below the cavoatrial junction as this could be pulled back 4-5 cm. No pneumothorax. Electronically Signed   By: Elberta Fortisaniel  Boyle M.D.   On: 09/04/2018 10:57   Dg Chest Port 1 View  Result Date: 08/22/18 CLINICAL DATA:  Shortness of breath. EXAM:  PORTABLE CHEST 1 VIEW COMPARISON:  08/20/2018, additional priors FINDINGS: Unchanged heart size and mediastinal contours. Aortic tortuosity and atherosclerosis. Development of bilateral pleural effusions. Development of prominent bilateral perihilar opacities. No pneumothorax. IMPRESSION: Development of bilateral pleural effusions and perihilar opacities, pulmonary edema versus airspace disease such as pneumonia or aspiration. Significant change in radiograph over the past 2 weeks. Electronically Signed   By: Narda RutherfordMelanie  Sanford M.D.   On: 007/11/20 20:21   ASSESSMENT AND PLAN:   Septic shock- sepsis is secondary to HCAP and possible UTI. -Continue vancomycin and cefepime -Continue pressors -Lactic acid has not resolved -Trend procalcitonin -Continue stress dose steroids -Follow-up cultures  Acute hypoxic respiratory failure-due to HCAP.  Patient is currently requiring high flow nasal cannula. -Treatment as above -Duo nebs PRN -Wean O2 as able  Coronary artery disease-stable, no active chest pain -Continue home meds  Type 2 diabetes -Continue SSI  Hyperlipidemia-stable -Continue home statin  GERD-stable -Continue home PPI  All the records are reviewed and case discussed with Care Management/Social Worker. Management plans discussed with the patient, family and they are in agreement.  CODE STATUS: DNR  TOTAL TIME TAKING CARE OF THIS PATIENT: 40 minutes.   More than 50% of the time was spent in counseling/coordination of care: YES  POSSIBLE D/C unknown, DEPENDING ON CLINICAL CONDITION.   Jinny BlossomKaty D Mayo M.D on 09/04/2018 at 3:58 PM  Between 7am to 6pm - Pager - [phone]  After 6pm go to [email or web address] - Social research officer, governmentpassword [email or web address] [email or web address]  [employer] [place] Hospitalists  Office  [phone]  CC: Primary care physician; Patient, No Pcp Per  Note: This dictation was prepared with Dragon dictation along with smaller phrase technology. Any transcriptional errors that result from  this process are unintentional.

## 2018-09-04 NOTE — Consult Note (Signed)
[facility] Nurse wound consult note Patient receiving care in [facility] ICU 11 Reason for Consult: sacral and should ulcers Wound type: The right upper posterior shoulder area has an unstageable PI that measures 0.4 cm x 1.0 cm and is surrounded by darkly discolored tissue, but it blanches.  The left upper posterior shoulder area also has an unstageable PI that measures 2 cm x 1.8 cm is 100% brown/yellow slough, surrounded by darkly discolored tissue that blanches.  The sacral wound is unstageable, measures 7.8 cm x 8.4 cm is 100% yellow/brown slough surrounded by erythema.  All are covered by foam dressings. Pressure Injury POA: Yes Dressing procedure/placement/frequency: Hydrocolloid dressings to the bilateral shoulder wounds; change every other day.  Saline moistened gauze to the sacral wound, cover with a foam pad; change twice daily. Monitor the wound area(s) for worsening of condition such as: Signs/symptoms of infection,  Increase in size,  Development of or worsening of odor, Development of pain, or increased pain at the affected locations.  Notify the medical team if any of these develop.  Thank you for the consult.  Discussed plan of care with the patient and bedside nurse.  [facility] nurse will not follow at this time.  Please re-consult the [facility] team if needed.  Val Riles, RN, MSN, CWOCN, CNS-BC, pager [phone]

## 2018-09-04 NOTE — Progress Notes (Signed)
CRITICAL VALUE ALERT  Critical Value: Potassium 6.3   Date & Time Notied:  09/04/2018 2225   Provider Notified: Jeoffrey Massed, NP  Orders Received/Actions taken: No new orders at this time.

## 2018-09-05 LAB — URINE CULTURE: Culture: 50000 — AB

## 2018-09-05 LAB — CBC
HCT: 30.4 % — ABNORMAL LOW (ref 39.0–52.0)
HCT: 31.5 % — ABNORMAL LOW (ref 39.0–52.0)
Hemoglobin: 9.2 g/dL — ABNORMAL LOW (ref 13.0–17.0)
Hemoglobin: 9.2 g/dL — ABNORMAL LOW (ref 13.0–17.0)
MCH: 27.7 pg (ref 26.0–34.0)
MCH: 26.8 pg (ref 26.0–34.0)
MCHC: 30.3 g/dL (ref 30.0–36.0)
MCHC: 29.2 g/dL — ABNORMAL LOW (ref 30.0–36.0)
MCV: 91.6 fL (ref 80.0–100.0)
MCV: 91.8 fL (ref 80.0–100.0)
Platelets: 375 10*3/uL (ref 150–400)
Platelets: 342 10*3/uL (ref 150–400)
RBC: 3.32 MIL/uL — ABNORMAL LOW (ref 4.22–5.81)
RBC: 3.43 MIL/uL — ABNORMAL LOW (ref 4.22–5.81)
RDW: 16.2 % — ABNORMAL HIGH (ref 11.5–15.5)
RDW: 16.1 % — ABNORMAL HIGH (ref 11.5–15.5)
WBC: 26.9 10*3/uL — ABNORMAL HIGH (ref 4.0–10.5)
WBC: 18.6 10*3/uL — ABNORMAL HIGH (ref 4.0–10.5)
nRBC: 0.2 % (ref 0.0–0.2)
nRBC: 1.6 % — ABNORMAL HIGH (ref 0.0–0.2)

## 2018-09-05 LAB — COMPREHENSIVE METABOLIC PANEL
ALT: 1080 U/L — ABNORMAL HIGH (ref 0–44)
ALT: 1428 U/L — ABNORMAL HIGH (ref 0–44)
AST: 4575 U/L — ABNORMAL HIGH (ref 15–41)
AST: 4767 U/L — ABNORMAL HIGH (ref 15–41)
Albumin: 2.3 g/dL — ABNORMAL LOW (ref 3.5–5.0)
Albumin: 2.4 g/dL — ABNORMAL LOW (ref 3.5–5.0)
Alkaline Phosphatase: 62 U/L (ref 38–126)
Alkaline Phosphatase: 75 U/L (ref 38–126)
Anion gap: 13 (ref 5–15)
Anion gap: 14 (ref 5–15)
BUN: 49 mg/dL — ABNORMAL HIGH (ref 8–23)
BUN: 67 mg/dL — ABNORMAL HIGH (ref 8–23)
CO2: 20 mmol/L — ABNORMAL LOW (ref 22–32)
CO2: 19 mmol/L — ABNORMAL LOW (ref 22–32)
Calcium: 7.1 mg/dL — ABNORMAL LOW (ref 8.9–10.3)
Calcium: 6.8 mg/dL — ABNORMAL LOW (ref 8.9–10.3)
Chloride: 114 mmol/L — ABNORMAL HIGH (ref 98–111)
Chloride: 116 mmol/L — ABNORMAL HIGH (ref 98–111)
Creatinine, Ser: 2.19 mg/dL — ABNORMAL HIGH (ref 0.61–1.24)
Creatinine, Ser: 2.97 mg/dL — ABNORMAL HIGH (ref 0.61–1.24)
GFR calc Af Amer: 32 mL/min — ABNORMAL LOW (ref >60)
GFR calc Af Amer: 22 mL/min — ABNORMAL LOW (ref >60)
GFR calc non Af Amer: 28 mL/min — ABNORMAL LOW (ref >60)
GFR calc non Af Amer: 19 mL/min — ABNORMAL LOW (ref >60)
Glucose, Bld: 458 mg/dL — ABNORMAL HIGH (ref 70–99)
Glucose, Bld: 407 mg/dL — ABNORMAL HIGH (ref 70–99)
Potassium: 5.5 mmol/L — ABNORMAL HIGH (ref 3.5–5.1)
Potassium: 5.9 mmol/L — ABNORMAL HIGH (ref 3.5–5.1)
Sodium: 147 mmol/L — ABNORMAL HIGH (ref 135–145)
Sodium: 149 mmol/L — ABNORMAL HIGH (ref 135–145)
Total Bilirubin: 1.5 mg/dL — ABNORMAL HIGH (ref 0.3–1.2)
Total Bilirubin: 1.8 mg/dL — ABNORMAL HIGH (ref 0.3–1.2)
Total Protein: 4.8 g/dL — ABNORMAL LOW (ref 6.5–8.1)
Total Protein: 4.9 g/dL — ABNORMAL LOW (ref 6.5–8.1)

## 2018-09-05 LAB — MAGNESIUM
Magnesium: 2.3 mg/dL (ref 1.7–2.4)
Magnesium: 2.6 mg/dL — ABNORMAL HIGH (ref 1.7–2.4)

## 2018-09-05 LAB — GLUCOSE, CAPILLARY
Glucose-Capillary: 172 mg/dL — ABNORMAL HIGH (ref 70–99)
Glucose-Capillary: 324 mg/dL — ABNORMAL HIGH (ref 70–99)
Glucose-Capillary: 329 mg/dL — ABNORMAL HIGH (ref 70–99)
Glucose-Capillary: 335 mg/dL — ABNORMAL HIGH (ref 70–99)
Glucose-Capillary: 348 mg/dL — ABNORMAL HIGH (ref 70–99)
Glucose-Capillary: 377 mg/dL — ABNORMAL HIGH (ref 70–99)
Glucose-Capillary: 442 mg/dL — ABNORMAL HIGH (ref 70–99)

## 2018-09-05 LAB — PROTIME-INR
INR: 2.7 — ABNORMAL HIGH (ref 0.8–1.2)
INR: 3.4 — ABNORMAL HIGH (ref 0.8–1.2)
Prothrombin Time: 28.5 seconds — ABNORMAL HIGH (ref 11.4–15.2)
Prothrombin Time: 33.7 seconds — ABNORMAL HIGH (ref 11.4–15.2)

## 2018-09-05 LAB — PROCALCITONIN: Procalcitonin: 21.99 ng/mL

## 2018-09-05 LAB — FIBRINOGEN: Fibrinogen: 444 mg/dL (ref 210–475)

## 2018-09-05 LAB — BRAIN NATRIURETIC PEPTIDE: B Natriuretic Peptide: 2936 pg/mL — ABNORMAL HIGH (ref 0.0–100.0)

## 2018-09-05 LAB — LACTIC ACID, PLASMA
Lactic Acid, Venous: 5.2 mmol/L (ref 0.5–1.9)
Lactic Acid, Venous: 7.2 mmol/L (ref 0.5–1.9)

## 2018-09-05 LAB — PHOSPHORUS
Phosphorus: 6.7 mg/dL — ABNORMAL HIGH (ref 2.5–4.6)
Phosphorus: 6.7 mg/dL — ABNORMAL HIGH (ref 2.5–4.6)

## 2018-09-05 LAB — VANCOMYCIN, RANDOM: Vancomycin Rm: 12

## 2018-09-05 MED ORDER — INSULIN ASPART 100 UNIT/ML ~~LOC~~ SOLN
0.0000 [IU] | SUBCUTANEOUS | Status: DC
  Administered 2018-09-05: 12:00:00 15 [IU] via SUBCUTANEOUS
  Administered 2018-09-05 (×3): 11 [IU] via SUBCUTANEOUS
  Filled 2018-09-05 (×4): qty 1

## 2018-09-05 MED ORDER — HYDROCORTISONE NA SUCCINATE PF 100 MG IJ SOLR
100.0000 mg | Freq: Two times a day (BID) | INTRAMUSCULAR | Status: DC
  Administered 2018-09-05 – 2018-09-06 (×2): 100 mg via INTRAVENOUS
  Filled 2018-09-05 (×2): qty 2

## 2018-09-05 MED ORDER — FUROSEMIDE 10 MG/ML IJ SOLN
40.0000 mg | Freq: Once | INTRAMUSCULAR | Status: AC
  Administered 2018-09-05: 22:00:00 40 mg via INTRAVENOUS
  Filled 2018-09-05: qty 4

## 2018-09-05 MED ORDER — DEXTROSE 50 % IV SOLN: 25.0000 mL | INTRAVENOUS | Status: DC | PRN

## 2018-09-05 MED ORDER — SODIUM CHLORIDE 0.9 % IV SOLN
500.0000 mg | INTRAVENOUS | Status: DC
  Administered 2018-09-05 – 2018-09-06 (×2): 500 mg via INTRAVENOUS
  Filled 2018-09-05 (×3): qty 500

## 2018-09-05 MED ORDER — NOREPINEPHRINE 16 MG/250ML-% IV SOLN
0.0000 ug/min | INTRAVENOUS | Status: DC
  Administered 2018-09-05: 30 ug/min via INTRAVENOUS
  Administered 2018-09-05: 32 ug/min via INTRAVENOUS
  Administered 2018-09-05: 07:00:00 26 ug/min via INTRAVENOUS
  Administered 2018-09-06: 40 ug/min via INTRAVENOUS
  Filled 2018-09-05 (×5): qty 250

## 2018-09-05 MED ORDER — FUROSEMIDE 10 MG/ML IJ SOLN
40.0000 mg | Freq: Once | INTRAMUSCULAR | Status: AC
  Administered 2018-09-05: 08:00:00 40 mg via INTRAVENOUS
  Filled 2018-09-05: qty 4

## 2018-09-05 MED ORDER — SODIUM CHLORIDE 0.45 % IV SOLN
INTRAVENOUS | Status: DC
  Administered 2018-09-06: 01:00:00 via INTRAVENOUS

## 2018-09-05 MED ORDER — ALBUMIN HUMAN 25 % IV SOLN
12.5000 g | Freq: Once | INTRAVENOUS | Status: AC
  Administered 2018-09-06: 12.5 g via INTRAVENOUS
  Filled 2018-09-05: qty 50

## 2018-09-05 MED ORDER — INSULIN REGULAR HUMAN 100 UNIT/ML IJ SOLN
10.0000 [IU] | Freq: Once | INTRAMUSCULAR | Status: AC
  Administered 2018-09-05: 10 [IU] via INTRAVENOUS
  Filled 2018-09-05: qty 10

## 2018-09-05 MED ORDER — DEXTROSE-NACL 5-0.45 % IV SOLN
INTRAVENOUS | Status: DC
  Administered 2018-09-06: 05:00:00 via INTRAVENOUS

## 2018-09-05 MED ORDER — VANCOMYCIN HCL IN DEXTROSE 750-5 MG/150ML-% IV SOLN
750.0000 mg | Freq: Once | INTRAVENOUS | Status: AC
  Administered 2018-09-05: 06:00:00 750 mg via INTRAVENOUS
  Filled 2018-09-05: qty 150

## 2018-09-05 MED ORDER — HYDROCORTISONE NA SUCCINATE PF 100 MG IJ SOLR
100.0000 mg | Freq: Once | INTRAMUSCULAR | Status: AC
  Administered 2018-09-05: 100 mg via INTRAVENOUS
  Filled 2018-09-05: qty 2

## 2018-09-05 MED ORDER — FUROSEMIDE 10 MG/ML IJ SOLN
40.0000 mg | Freq: Once | INTRAMUSCULAR | Status: AC
  Administered 2018-09-05: 40 mg via INTRAVENOUS

## 2018-09-05 MED ORDER — ALBUMIN HUMAN 25 % IV SOLN
12.5000 g | Freq: Once | INTRAVENOUS | Status: AC
  Administered 2018-09-05: 12.5 g via INTRAVENOUS
  Filled 2018-09-05: qty 50

## 2018-09-05 MED ORDER — SODIUM BICARBONATE 8.4 % IV SOLN
100.0000 meq | Freq: Once | INTRAVENOUS | Status: AC
  Administered 2018-09-06: 100 meq via INTRAVENOUS
  Filled 2018-09-05: qty 100

## 2018-09-05 MED ORDER — VASOPRESSIN 20 UNIT/ML IV SOLN
0.0300 [IU]/min | INTRAVENOUS | Status: DC
  Administered 2018-09-05 – 2018-09-06 (×2): 0.03 [IU]/min via INTRAVENOUS
  Filled 2018-09-05 (×2): qty 2

## 2018-09-05 MED ORDER — CALCIUM GLUCONATE-NACL 1-0.675 GM/50ML-% IV SOLN
1.0000 g | Freq: Once | INTRAVENOUS | Status: AC
  Administered 2018-09-06: 1000 mg via INTRAVENOUS
  Filled 2018-09-05: qty 50

## 2018-09-05 MED ORDER — INSULIN REGULAR(HUMAN) IN NACL 100-0.9 UT/100ML-% IV SOLN
INTRAVENOUS | Status: DC
  Administered 2018-09-06: 2.6 [IU]/h via INTRAVENOUS
  Administered 2018-09-06: 7.6 [IU]/h via INTRAVENOUS
  Filled 2018-09-05 (×2): qty 100

## 2018-09-05 NOTE — Progress Notes (Addendum)
   [facility] at [place] NAME: Yonah Tangeman    MR#:  [id number]  DATE OF BIRTH:  September 22, 1940  SUBJECTIVE:   unresponsive today REVIEW OF SYSTEMS:  Minimally responsive not able to obtain review of systems. DRUG ALLERGIES:  No Known Allergies VITALS:  Blood pressure 102/62, pulse (!) 127, temperature 98.3 F (36.8 C), temperature source Axillary, resp. rate (!) 37, height 5\' 4"  (1.626 m), weight 80.7 kg, SpO2 100 %. PHYSICAL EXAMINATION:  Physical Exam  GENERAL:  Laying in the bed , intermittently responsive. HEENT: Head atraumatic, normocephalic. Pupils equal, round, reactive to light and accommodation. No scleral icterus. Extraocular muscles intact. Oropharynx and nasopharynx clear.  NECK:  Supple, no jugular venous distention. No thyroid enlargement. LUNGS: + Diffuse rhonchi present, mildly increased work of breathing, nasal cannula in place. CARDIOVASCULAR: Tachycardic, regular rhythm, S1, S2 normal. No murmurs, rubs, or gallops.  ABDOMEN: Soft, nontender, nondistended. Bowel sounds present.  EXTREMITIES: No pedal edema, cyanosis, or clubbing.  NEUROLOGIC: CN 2-12 intact, no focal deficits. 5/5 muscle strength throughout all extremities. Sensation intact throughout. Gait not checked.  PSYCHIATRIC: Lethargic today sKIN: No obvious rash, lesion, or ulcer.  LABORATORY PANEL:  Male CBC Recent Labs  Lab 09/05/18 0338  WBC 26.9*  HGB 9.2*  HCT 30.4*  PLT 375   ------------------------------------------------------------------------------------------------------------------ Chemistries  Recent Labs  Lab 09/05/18 0338  NA 147*  K 5.5*  CL 114*  CO2 20*  GLUCOSE 458*  BUN 49*  CREATININE 2.19*  CALCIUM 7.1*  MG 2.3  AST 4,575*  ALT 1,080*  ALKPHOS 62  BILITOT 1.5*   RADIOLOGY:  No results found. ASSESSMENT AND PLAN:   Septic shock- sepsis is secondary to HCAP and possible UTI. -Continue vancomycin and cefepime septic  shock associated with worsening renal failure, now has hyperkalemia, patient received calcium gluconate, Lasix for hyperkalemia, For septic shock patient is started on vasopressin. Shock liver, patient AST elevated to 4575, ALT thousand 80 today, getting work-up for DIC, patient continues to have elevated white count up to 26.9, -Continue pressors -Lactic acid has not resolved -Trend procalcitonin,-procalcitonin up at 21.  White count also elevated to 26.9 continue stress dose steroids -Follow-up cultures  Acute hypoxic respiratory failure-due to HCAP.  Patient is currently requiring high flow nasal cannula. -Treatment as above -Duo nebs PRN -Wean O2 as able  Coronary artery disease-stable, no active chest pain -Continue home meds  Type 2 diabetes -Continue SSI  Hyperlipidemia-stable -Continue home statin  GERD-stable -Continue home PPI  Shock liver, LFTs are worse today along with worsening renal failure, condition is critical, high risk for cardiac arrest, CODE STATUS DNR.  All the records are reviewed and case discussed with Care Management/Social Worker. Management plans discussed with the patient, family and they are in agreement.  CODE STATUS: DNR  TOTAL TIME TAKING CARE OF THIS PATIENT: 40 minutes.   More than 50% of the time was spent in counseling/coordination of care: YES  POSSIBLE D/C unknown, DEPENDING ON CLINICAL CONDITION.   Epifanio Lesches M.D on 09/05/2018 at 2:22 PM  Between 7am to 6pm - Pager - [phone]  After 6pm go to [email or web address] - Proofreader  [employer]  Hospitalists  Office  [phone]  CC: Primary care physician; Patient, No Pcp Per  Note: This dictation was prepared with Dragon dictation along with smaller phrase technology. Any transcriptional errors that result from this process are unintentional.

## 2018-09-05 NOTE — Progress Notes (Signed)
CRITICAL CARE PROGRESS NOTE       SUBJECTIVE FINDINGS & SIGNIFICANT EVENTS   Patient remains critically ill Prognosis is guarded  Patient is in septic shock with shock liver/AKI on multiple vasopressors.   Previous blood culture with +MORGANELLA MORGANI and MRSE  Charlett Nose Arms - family contact first  cousin in [phone]  -discussed case in detail, overall poor prognosis. Discussed possible need for intubation if patient continues to decline. Charlett Nose consents to endotracheal intubation.   PAST MEDICAL HISTORY   Past Medical History:  Diagnosis Date  . Acute kidney failure ([place])   . Anxiety disorder   . Coronary artery disease    Non-ST elevation myocardial infarction in November 2015 in the setting of septic shock and multiorgan failure. Peak troponin was 25. Echo showed normal LV systolic function with inferior and posterior hypokinesis.  . Diabetes mellitus without complication ([place])   . Dysphagia   . Gastro-esophageal reflux   . Hematuria   . Hyperlipidemia   . Hypernatremia   . Hypertension   . MI (myocardial infarction) ([place])   . Muscle weakness   . PVD (peripheral vascular disease) ([place])   . Rhabdomyolysis   . Urinary retention      SURGICAL HISTORY   Past Surgical History:  Procedure Laterality Date  . ABDOMINAL SURGERY    . 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 leg amputation       FAMILY HISTORY   Family History  Problem Relation Age of Onset  . Heart Problems Father   . Heart disease Father      SOCIAL HISTORY   Social History   Tobacco Use  . Smoking status: Former Smoker    Types: Cigarettes  . Smokeless tobacco: Never Used  Substance  Use Topics  . Alcohol use: No  . Drug use: No     MEDICATIONS   Current Medication:  Current Facility-Administered Medications:  .  acetaminophen (TYLENOL) tablet 650 mg, 650 mg, Oral, Q6H PRN **OR** acetaminophen (TYLENOL) suppository 650 mg, 650 mg, Rectal, Q6H PRN, Lance Coon, MD .  albumin human 25 % solution 25 g, 25 g, Intravenous, Once, Devoiry Corriher, MD .  aspirin EC tablet 81 mg, 81 mg, Oral, Daily, Lance Coon, MD, 81 mg at 09/04/18 1048 .  atorvastatin (LIPITOR) tablet 80 mg, 80 mg, Oral, Daily, Lance Coon, MD, 80 mg at 09/04/18 1049 .  benzonatate (TESSALON) capsule 200 mg, 200 mg, Oral, TID PRN, Lance Coon, MD .  ceFEPIme (MAXIPIME) 2 g in sodium chloride 0.9 % 100 mL IVPB, 2 g, Intravenous, Q24H, Hallaji, Sheema M, RPH .  Chlorhexidine Gluconate Cloth 2 % PADS 6 each, 6 each, Topical, Q0600, Lance Coon, MD, 6 each at 09/05/18 0533 .  clopidogrel (PLAVIX) tablet 75 mg, 75 mg, Oral, Daily, Lance Coon, MD, 75 mg at 09/04/18 1048 .  enoxaparin (LOVENOX) injection 30 mg, 30 mg, Subcutaneous, Q24H, Hallaji, Sheema M, RPH .  guaiFENesin-dextromethorphan (ROBITUSSIN DM) 100-10 MG/5ML syrup 5 mL, 5 mL, Oral, Q4H PRN, Lance Coon, MD .  insulin aspart (novoLOG) injection 0-9 Units, 0-9 Units, Subcutaneous, Q4H, Tukov-Yual, Magdalene S, NP, 7 Units at 09/05/18 0329 .  lactated ringers infusion, , Intravenous, Continuous, Blakeney, Dreama Saa, NP, Last Rate: 75 mL/hr at 09/05/18 0600 .  levalbuterol (XOPENEX) nebulizer solution 1.25 mg, 1.25 mg, Nebulization, Q6H PRN, Lance Coon, MD .  methylPREDNISolone sodium  succinate (SOLU-MEDROL) 40 mg/mL injection 40 mg, 40 mg, Intravenous, Q12H, Blakeney, Neldon Newportana G, NP, 40 mg at 09/05/18 0329 .  morphine 2 MG/ML injection 2 mg, 2 mg, Intravenous, Q3H PRN, Tukov-Yual, Magdalene S, NP, 2 mg at 09/05/18 0556 .  mupirocin ointment (BACTROBAN) 2 %, , Nasal, BID, Blakeney, Neldon Newportana G, NP .  norepinephrine (LEVOPHED) 4 mg in dextrose 5 % 250  mL (0.016 mg/mL) infusion, 0-40 mcg/min, Intravenous, Titrated, Hallaji, Sheema M, RPH, Last Rate: 90 mL/hr at 09/05/18 0600, 24 mcg/min at 09/05/18 0600 .  ondansetron (ZOFRAN) tablet 4 mg, 4 mg, Oral, Q6H PRN **OR** ondansetron (ZOFRAN) injection 4 mg, 4 mg, Intravenous, Q6H PRN, Oralia ManisWillis, David, MD .  pantoprazole (PROTONIX) injection 40 mg, 40 mg, Intravenous, Q24H, Blakeney, Dana G, NP, 40 mg at 09/05/18 0329 .  sodium bicarbonate 150 mEq in sterile water 1,000 mL infusion, , Intravenous, Continuous, Tukov-Yual, Magdalene S, NP, Last Rate: 125 mL/hr at 09/05/18 0600 .  tamsulosin (FLOMAX) capsule 0.4 mg, 0.4 mg, Oral, Daily, Oralia ManisWillis, David, MD, 0.4 mg at 09/04/18 1048 .  vancomycin variable dose per unstable renal function (pharmacist dosing), , Does not apply, See admin instructions, Gardner CandleHallaji, Sheema M, RPH    ALLERGIES   Patient has no known allergies.    REVIEW OF SYSTEMS    Unable to obtain lethargic state in septic shock  PHYSICAL EXAMINATION   Vitals:   09/05/18 0530 09/05/18 0600  BP: 108/66 103/65  Pulse: (!) 120 (!) 123  Resp: (!) 39 (!) 38  Temp:    SpO2: 100% 99%    GENERAL: HEAD: Normocephalic, atraumatic.  EYES: Pupils equal, round, reactive to light.  No scleral icterus.  MOUTH: Moist mucosal membrane. NECK: Supple. No thyromegaly. No nodules. No JVD.  PULMONARY: crackles bilaterally  CARDIOVASCULAR: S1 and S2. Regular rate and rhythm. No murmurs, rubs, or gallops.  GASTROINTESTINAL: Soft, nontender, non-distended. No masses. Positive bowel sounds. No hepatosplenomegaly.  MUSCULOSKELETAL: No swelling, clubbing, or edema.  NEUROLOGIC: Mild distress due to acute illness SKIN:intact,warm,dry   LABS AND IMAGING       LAB RESULTS: Recent Labs  Lab 09/04/18 0306 09/04/18 2126 09/05/18 0338  NA 148* 147* 147*  K 4.8 6.3* 5.5*  CL 120* 118* 114*  CO2 20* 13* 20*  BUN 29* 46* 49*  CREATININE 1.61* 2.31* 2.19*  GLUCOSE 250* 338* 458*   Recent Labs   Lab 09/04/18 0306 09/04/18 2126 09/05/18 0338  HGB 9.8* 9.2* 9.2*  HCT 33.1* 32.2* 30.4*  WBC 32.6* 24.5* 26.9*  PLT 418* 407* 375     IMAGING RESULTS: Dg Chest Port 1 View  Result Date: 09/04/2018 CLINICAL DATA:  Post right IJ central line placement. EXAM: PORTABLE CHEST 1 VIEW COMPARISON:  05/03/18 FINDINGS: Placement of right IJ central venous catheter which has tip below the cavoatrial junction as this could be pulled back 4-5 cm. Lungs are adequately inflated demonstrate slight interval worsening of moderate bilateral perihilar airspace opacification likely worsening moderate interstitial edema. Multifocal infection is less likely. No pneumothorax. Small bilateral pleural effusions without significant change. Stable cardiomegaly. Remainder of the exam is unchanged. IMPRESSION: Slight interval worsening of moderate bilateral perihilar airspace opacification likely moderate interstitial edema and less likely infection. Stable small bilateral pleural effusions. Right IJ central venous catheter with tip below the cavoatrial junction as this could be pulled back 4-5 cm. No pneumothorax. Electronically Signed   By: Elberta Fortisaniel  Boyle M.D.   On: 09/04/2018 10:57      ASSESSMENT AND PLAN    -  Multidisciplinary rounds held today  Septic shock -bacteremia - MRSE and morganella -complicated by shock liver,severe aki,anasarca -vasopressors- Levophed and Vaopressin - checking DIC labs today for possible need to deliver cryoprecipitate - s/p Right IJ triple lumen central venous catheter -on Cefepime/Vanco/zithromax -Solucortef 100 bid  -Albumin 25% 1 amp today -discussed with next of kin Bosie ClosJudith Arms    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%   - currently requiring levophed and vasopressin -oxygen as needed -Lasix as tolerated -follow up cardiac enzymes as indicated ICU monitoring    Acute Renal Failure KDIGO stage 3- Likely due to ATN secondary to septic shock -follow chem 7 -follow UO -continue Foley Catheter-assess need daily    GI/Nutrition GI PROPHYLAXIS as indicated DIET-->TF's as tolerated Constipation protocol as indicated   ENDO - ICU hypoglycemic\Hyperglycemia protocol -check FSBS per protocol   ELECTROLYTES -follow labs as needed -replace as needed -pharmacy consultation   DVT/GI PRX ordered -SCDs  TRANSFUSIONS AS NEEDED MONITOR FSBS ASSESS the need for LABS as needed   Critical care provider statement:    Critical care time (minutes):  109   Critical care time was exclusive of:  Separately billable procedures and treating other patients   Critical care was necessary to treat or prevent imminent or life-threatening deterioration of the following conditions:  septic shock, acute decompensated systolic CHF, altered mental status,    Critical care was time spent personally by me on the following activities:  Development of treatment plan with patient or surrogate, discussions with consultants, evaluation of patient's response to treatment, examination of patient, obtaining history from patient or surrogate, ordering and performing treatments and interventions, ordering and review of laboratory studies and re-evaluation of patient's condition.  I assumed direction of critical care for this patient from another provider in my specialty: no    This document was prepared using Dragon voice recognition software and may include unintentional dictation errors.    Vida RiggerFuad Sueanne Maniaci, M.D.  Division of Pulmonary & Critical Care Medicine  [facility]

## 2018-09-05 NOTE — Consult Note (Addendum)
Pharmacy Antibiotic Note  Spencer Edwards is a 78 y.o. male admitted on 09-26-2018 with sepsis.  Pharmacy has been consulted for Cefepime and Vancomcyin dosing.   Patient received total loading dose of 1750mg  IV.  Plan: 7/25 @ 0338 AM: Vancomycin Random: 12.  Scr: 1.41>> 1.61>> 2.31>> 2.19. Renal function is not stable.  Will continue to dose by vancomycin random levels. Will order Vancomycin 750mg  IV x 1 dose.   Vancomycin random level ordered with AM labs.    Continue Cefepime 2g IV every 24 hours   Height: 5\' 4"  (162.6 cm) Weight: 177 lb 14.6 oz (80.7 kg) IBW/kg (Calculated) : 59.2  Temp (24hrs), Avg:98.6 F (37 C), Min:98.2 F (36.8 C), Max:99.7 F (37.6 C)  Recent Labs  Lab 09/26/18 2002 09/26/2018 2300 09/04/18 0306 09/04/18 2126 09/05/18 0338  WBC 17.7*  --  32.6* 24.5* 26.9*  CREATININE 1.41*  --  1.61* 2.31* 2.19*  LATICACIDVEN 3.6* 2.2* 2.5* 7.3* 5.2*  VANCORANDOM  --   --   --   --  12    Estimated Creatinine Clearance: 26.2 mL/min (A) (by C-G formula based on SCr of 2.19 mg/dL (H)).    No Known Allergies  Antimicrobials this admission: 7/23 vancomyicn >>  7/23 cefepime >>   Microbiology results: 7/23 BCx: pending 7/23 UCx: pending 7/23 SARS SARS Coronavirus 2: negative   7/24 MRSA PCR: positive   Thank you for allowing pharmacy to be a part of this patient's care.  Pernell Dupre, PharmD, BCPS Clinical Pharmacist 09/05/2018 5:09 AM

## 2018-09-06 ENCOUNTER — Inpatient Hospital Stay: Payer: Medicare Other

## 2018-09-06 LAB — COMPREHENSIVE METABOLIC PANEL
ALT: 1393 U/L — ABNORMAL HIGH (ref 0–44)
AST: 4979 U/L — ABNORMAL HIGH (ref 15–41)
Albumin: 2.5 g/dL — ABNORMAL LOW (ref 3.5–5.0)
Alkaline Phosphatase: 74 U/L (ref 38–126)
Anion gap: 11 (ref 5–15)
BUN: 70 mg/dL — ABNORMAL HIGH (ref 8–23)
CO2: 24 mmol/L (ref 22–32)
Calcium: 6.8 mg/dL — ABNORMAL LOW (ref 8.9–10.3)
Chloride: 117 mmol/L — ABNORMAL HIGH (ref 98–111)
Creatinine, Ser: 3.23 mg/dL — ABNORMAL HIGH (ref 0.61–1.24)
GFR calc Af Amer: 20 mL/min — ABNORMAL LOW (ref >60)
GFR calc non Af Amer: 17 mL/min — ABNORMAL LOW (ref >60)
Glucose, Bld: 309 mg/dL — ABNORMAL HIGH (ref 70–99)
Potassium: 5.2 mmol/L — ABNORMAL HIGH (ref 3.5–5.1)
Sodium: 152 mmol/L — ABNORMAL HIGH (ref 135–145)
Total Bilirubin: 1.8 mg/dL — ABNORMAL HIGH (ref 0.3–1.2)
Total Protein: 5 g/dL — ABNORMAL LOW (ref 6.5–8.1)

## 2018-09-06 LAB — GLUCOSE, CAPILLARY
Glucose-Capillary: 116 mg/dL — ABNORMAL HIGH (ref 70–99)
Glucose-Capillary: 119 mg/dL — ABNORMAL HIGH (ref 70–99)
Glucose-Capillary: 127 mg/dL — ABNORMAL HIGH (ref 70–99)
Glucose-Capillary: 131 mg/dL — ABNORMAL HIGH (ref 70–99)
Glucose-Capillary: 138 mg/dL — ABNORMAL HIGH (ref 70–99)
Glucose-Capillary: 155 mg/dL — ABNORMAL HIGH (ref 70–99)
Glucose-Capillary: 206 mg/dL — ABNORMAL HIGH (ref 70–99)
Glucose-Capillary: 243 mg/dL — ABNORMAL HIGH (ref 70–99)
Glucose-Capillary: 249 mg/dL — ABNORMAL HIGH (ref 70–99)
Glucose-Capillary: 256 mg/dL — ABNORMAL HIGH (ref 70–99)
Glucose-Capillary: 307 mg/dL — ABNORMAL HIGH (ref 70–99)
Glucose-Capillary: 311 mg/dL — ABNORMAL HIGH (ref 70–99)
Glucose-Capillary: 315 mg/dL — ABNORMAL HIGH (ref 70–99)
Glucose-Capillary: 420 mg/dL — ABNORMAL HIGH (ref 70–99)

## 2018-09-06 LAB — CBC
HCT: 30.9 % — ABNORMAL LOW (ref 39.0–52.0)
Hemoglobin: 9.2 g/dL — ABNORMAL LOW (ref 13.0–17.0)
MCH: 26.9 pg (ref 26.0–34.0)
MCHC: 29.8 g/dL — ABNORMAL LOW (ref 30.0–36.0)
MCV: 90.4 fL (ref 80.0–100.0)
Platelets: 317 10*3/uL (ref 150–400)
RBC: 3.42 MIL/uL — ABNORMAL LOW (ref 4.22–5.81)
RDW: 16.1 % — ABNORMAL HIGH (ref 11.5–15.5)
WBC: 16.6 10*3/uL — ABNORMAL HIGH (ref 4.0–10.5)
nRBC: 1.7 % — ABNORMAL HIGH (ref 0.0–0.2)

## 2018-09-06 LAB — VANCOMYCIN, RANDOM: Vancomycin Rm: 16

## 2018-09-06 LAB — PROCALCITONIN: Procalcitonin: 26.69 ng/mL

## 2018-09-06 MED ORDER — MORPHINE SULFATE (PF) 2 MG/ML IV SOLN
2.0000 mg | INTRAVENOUS | Status: DC | PRN
  Administered 2018-09-06: 2 mg via INTRAVENOUS
  Filled 2018-09-06: qty 1

## 2018-09-06 MED ORDER — LACTATED RINGERS IV BOLUS
1000.0000 mL | Freq: Once | INTRAVENOUS | Status: AC
  Administered 2018-09-06: 1000 mL via INTRAVENOUS

## 2018-09-06 MED ORDER — VANCOMYCIN HCL IN DEXTROSE 750-5 MG/150ML-% IV SOLN
750.0000 mg | Freq: Once | INTRAVENOUS | Status: AC
  Administered 2018-09-06: 10:00:00 750 mg via INTRAVENOUS
  Filled 2018-09-06: qty 150

## 2018-09-06 MED ORDER — DOBUTAMINE IN D5W 4-5 MG/ML-% IV SOLN
2.5000 ug/kg/min | INTRAVENOUS | Status: DC
  Administered 2018-09-06: 2.5 ug/kg/min via INTRAVENOUS
  Filled 2018-09-06: qty 250

## 2018-09-06 MED ORDER — SODIUM CHLORIDE 0.9% FLUSH
10.0000 mL | Freq: Two times a day (BID) | INTRAVENOUS | Status: DC
  Administered 2018-09-06: 20 mL
  Administered 2018-09-06: 10 mL

## 2018-09-06 MED ORDER — GLYCOPYRROLATE 0.2 MG/ML IJ SOLN
0.1000 mg | INTRAMUSCULAR | Status: DC | PRN
  Administered 2018-09-06: 15:00:00 0.1 mg via INTRAVENOUS
  Filled 2018-09-06: qty 1

## 2018-09-06 MED ORDER — HEPARIN SODIUM (PORCINE) 5000 UNIT/ML IJ SOLN: 5000.0000 [IU] | Freq: Three times a day (TID) | INTRAMUSCULAR | Status: DC

## 2018-09-06 MED ORDER — DIGOXIN 0.25 MG/ML IJ SOLN
0.5000 mg | INTRAMUSCULAR | Status: AC
  Administered 2018-09-06: 0.5 mg via INTRAVENOUS

## 2018-09-06 MED ORDER — SODIUM CHLORIDE 0.9% FLUSH: 10.0000 mL | INTRAVENOUS | Status: DC | PRN

## 2018-09-08 LAB — CULTURE, BLOOD (ROUTINE X 2)
Culture: NO GROWTH
Culture: NO GROWTH
Special Requests: ADEQUATE
Special Requests: ADEQUATE

## 2018-09-09 LAB — FUNGITELL, SERUM: Fungitell Result: 40 pg/mL (ref <80)

## 2018-09-12 NOTE — Consult Note (Signed)
Pharmacy Antibiotic Note  Spencer Edwards is a 78 y.o. male admitted on 2018-09-16 with sepsis.  Pharmacy has been consulted for Cefepime and Vancomcyin dosing.   Patient received total loading dose of 1750mg  IV.  Plan: 7/26 @ 0421 AM: Vancomycin Random: 16  Scr: 1.41>> 1.61>> 2.31>> 2.19>> 3.23. Renal function worsening.  Will continue to dose by vancomycin random levels. Will order Vancomycin 750mg  IV x 1 dose.   Vancomycin random level ordered ~ 36 hours after dose.      Continue Cefepime 2g IV every 24 hours   Height: 5\' 4"  (162.6 cm) Weight: 177 lb 14.6 oz (80.7 kg) IBW/kg (Calculated) : 59.2  Temp (24hrs), Avg:98.9 F (37.2 C), Min:97.3 F (36.3 C), Max:100.4 F (38 C)  Recent Labs  Lab September 16, 2018 2300 09/04/18 0306 09/04/18 2126 09/05/18 0338 09/05/18 2240 08/24/2018 0421  WBC  --  32.6* 24.5* 26.9* 18.6* 16.6*  CREATININE  --  1.61* 2.31* 2.19* 2.97* 3.23*  LATICACIDVEN 2.2* 2.5* 7.3* 5.2* 7.2*  --   VANCORANDOM  --   --   --  12  --  16    Estimated Creatinine Clearance: 17.8 mL/min (A) (by C-G formula based on SCr of 3.23 mg/dL (H)).    No Known Allergies  Antimicrobials this admission: 7/23 vancomyicn >>  7/23 cefepime >>   Microbiology results: 7/23 BCx: pending 7/23 UCx: pending 7/23 SARS SARS Coronavirus 2: negative   7/24 MRSA PCR: positive   Thank you for allowing pharmacy to be a part of this patient's care.  Pernell Dupre, PharmD, BCPS Clinical Pharmacist 08/29/2018 5:54 AM

## 2018-09-12 NOTE — Progress Notes (Signed)
Patient declining. Blood pressure goals unable to be maintained even after max dose levophed, vasopressin, and max dose dobutamine that was added. Two 1L boluses of LR given (second still infusing at this time). Patient sustaining in SVT even after digoxin IV dose. Dr. Lanney Gins aware of patient's condition and has assessed patient several times in person in addition to updates from this RN. Dr. Lanney Gins updated patient's cousin Bethena Roys on the phone about patient's condition and potential imminent death.

## 2018-09-12 NOTE — Progress Notes (Signed)
[place] [facility] [place] NAME: Spencer Edwards    MR#:  [id number]  DATE OF BIRTH:  [birth year]  SUBJECTIVE:   Critically ill today on high-dose Levophed, vasopressin. REVIEW OF SYSTEMS:  Minimally responsive not able to obtain review of systems. DRUG ALLERGIES:  No Known Allergies VITALS:  Blood pressure (!) 72/53, pulse (!) 175, temperature 98 F (36.7 C), temperature source Axillary, resp. rate (!) 35, height 5\' 4"  (1.626 m), weight 80.7 kg, SpO2 100 %. PHYSICAL EXAMINATION:  Physical Exam  GENERAL:   Critically ill.Marland Kitchen HEENT: Head atraumatic, normocephalic. Pupils equal, round, reactive to light . No scleral icterus. Extraocular muscles intact. Oropharynx and nasopharynx clear.  NECK:  Supple, no jugular venous distention. No thyroid enlargement. LUNGS: + Diffuse rhonchi present, mildly increased work of breathing, nasal cannula in place. CARDIOVASCULAR: Tachycardic, regular rhythm, S1, S2 normal. No murmurs, rubs, or gallops.  ABDOMEN: Soft, nontender, nondistended. Bowel sounds present.  EXTREMITIES: No pedal edema, cyanosis, or clubbing.  NEUROLOGIC:  Lethargic,  pSYCHIATRIC: Lethargic today  sKIN: No obvious rash, lesion, or ulcer.  LABORATORY PANEL:  Male CBC Recent Labs  Lab 08/21/2018 0421  WBC 16.6*  HGB 9.2*  HCT 30.9*  PLT 317   ------------------------------------------------------------------------------------------------------------------ Chemistries  Recent Labs  Lab 09/05/18 2240 08/17/2018 0421  NA 149* 152*  K 5.9* 5.2*  CL 116* 117*  CO2 19* 24  GLUCOSE 407* 309*  BUN 67* 70*  CREATININE 2.97* 3.23*  CALCIUM 6.8* 6.8*  MG 2.6*  --   AST 4,767* 4,979*  ALT 1,428* 1,393*  ALKPHOS 75 74  BILITOT 1.8* 1.8*   RADIOLOGY:  Dg Chest Port 1 View  Result Date: 08/29/2018 CLINICAL DATA:  78 year old male with history of acute respiratory distress. Former smoker. EXAM: PORTABLE CHEST 1 VIEW COMPARISON:  Chest  x-ray 09/04/2018. FINDINGS: There is a right-sided internal jugular central venous catheter with tip terminating in the right atrium. Lung volumes have decreased dramatically. Persistent patchy multifocal airspace consolidation most severe throughout the mid to lower lungs bilaterally. Increasing moderate bilateral pleural effusions. Cardiac silhouette is obscured. Upper mediastinal contours are within normal limits. Aortic atherosclerosis. IMPRESSION: 1. Support apparatus, as above. 2. Persistent multilobar bilateral pneumonia with decreasing lung volumes and moderate bilateral pleural effusions. Electronically Signed   By: Vinnie Langton M.D.   On: 09/08/2018 06:45   ASSESSMENT AND PLAN:   Septic shock- sepsis is secondary to HCAP and possible UTI. -Continue vancomycin and cefepime septic shock associated with worsening renal failure, shock liver, worsening sepsis with elevated lactic acid, now requiring 2 pressors , ICU physician is talking to the family. For septic shock patient i he is on Levophed, vasopressin  shock liver, p worsening LFTs -L worsening lactic acidosis, including procalcitonin, now has hypernatremia, worsening renal failure, patient condition is critical, high risk for cardiac arrest.  Patient is DNR, recommend comfort care measures, ICU physician is talking to the family.  Acute hypoxic respiratory failure-due to HCAP.  Patient is currently requiring high flow nasal cannula. -Treatment as above -Duo nebs PRN -Wean O2 as able   Coronary artery disease-stable, no active chest pain -Continue home meds  Type 2 diabetes -Continue SSI  Hyperlipidemia-stable -Continue home statin  GERD-stable -Continue home PPI  Shock liver, LFTs are worse today along with worsening renal failure, condition is critical, high risk for cardiac arrest, CODE STATUS DNR.  All the records are reviewed and case discussed with Care Management/Social Worker. Management plans discussed with  the  patient, family and they are in agreement.  CODE STATUS: DNR  TOTAL TIME TAKING CARE OF THIS PATIENT: 40 minutes.   More than 50% of the time was spent in counseling/coordination of care: YES  POSSIBLE D/C unknown, DEPENDING ON CLINICAL CONDITION.   Katha HammingSnehalatha Marselino Slayton M.D on 25-Nov-2018 at 2:34 PM  Between 7am to 6pm - Pager - [phone]  After 6pm go to [email or web address] - Social research officer, governmentpassword [email or web address]  [facility]  Office  [phone]  CC: Primary care physician; Patient, No Pcp Per  Note: This dictation was prepared with Dragon dictation along with smaller phrase technology. Any transcriptional errors that result from this process are unintentional.

## 2018-09-12 NOTE — Discharge Summary (Signed)
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 [facility] ER on 07[facility]23 from [facility] with tachycardia and tachypnea staff concerned of possible aspiration.  He was [facility] following treatment of sepsis secondary to 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, acute frontal CVA, and morganella and coag neg staph bacteriemia.  Per ER notes during current presentation EMS reported upon their arrival at [facility] pt febrile temp 103.1 F, bp 96/55, and tachycardic with audible crackles.  In the ER lab results revealed Na+ 149, chloride 116, BUN 25, creatinine 1.41, lactic acid 3.6, wbc 17.7, and hgb 11.3.  UA positive for UTI, COVID-19 negative, and CXR concerning for bilateral pneumonia.  He received vancomycin and cefepime. Pt remained hypotensive sbp 80's, he received 2.5 L NS bolus however he remained hypotensive requiring levophed gtt.  He was subsequently admitted to the ICU by hospitalist team for additional workup and treatment.    Due to persistent clinical worsening in context of severe septic shock with multiple organ failure additional conversation with next of kin Darel HongJudy arms regarding goals of care had been conducted and decision was made to place patient on comfort care with no additional escalation of therapy.  Patient passed at 1610.  Bosie ClosJudith Arms - family contact first  cousin in [phone]    PAST MEDICAL HISTORY   Past Medical History:  Diagnosis Date  . Acute kidney failure (HCC)   .  Anxiety disorder   . Coronary artery disease    Non-ST elevation myocardial infarction in November 2015 in the setting of septic shock and multiorgan failure. Peak troponin was 25. Echo showed normal LV systolic function with inferior and posterior hypokinesis.  . Diabetes mellitus without complication (HCC)   . Dysphagia   . Gastro-esophageal reflux   . Hematuria   . Hyperlipidemia   . Hypernatremia   . Hypertension   . MI (myocardial infarction) (HCC)   . Muscle weakness   . PVD (peripheral vascular disease) (HCC)   . Rhabdomyolysis   . Urinary retention      SURGICAL HISTORY   Past Surgical History:  Procedure Laterality Date  . ABDOMINAL SURGERY    . AMPUTATION Left 08/28/2018   Procedure: AMPUTATION ABOVE KNEE;  Surgeon: Renford DillsSchnier, Gregory G, MD;  Location: [facility] ORS;  Service: Vascular;  Laterality: Left;  . leg amputation       FAMILY HISTORY   Family History  Problem Relation Age of Onset  . Heart Problems Father   . Heart disease Father      SOCIAL HISTORY   Social History   Tobacco Use  . Smoking status: Former Smoker    Types: Cigarettes  . Smokeless tobacco: Never Used  Substance Use Topics  . Alcohol use: No  . Drug use: No     MEDICATIONS   Current Medication: No current facility-administered medications for this encounter.   Current Outpatient Medications:  .  ALPRAZolam (XANAX) 0.25 MG tablet, Take 0.25 mg by mouth 3 (three) times daily., Disp: , Rfl:  .  Amino Acids-Protein Hydrolys (FEEDING SUPPLEMENT, PRO-STAT SUGAR FREE 64,) LIQD, Take 30 mLs by mouth 2 (two) times daily., Disp: , Rfl:  .  aspirin EC 81 MG tablet, Take 81  mg by mouth daily. , Disp: , Rfl:  .  atorvastatin (LIPITOR) 80 MG tablet, Take 80 mg by mouth daily., Disp: , Rfl:  .  bisacodyl (DULCOLAX) 10 MG suppository, Place 10 mg rectally as needed for moderate constipation., Disp: , Rfl:  .  clopidogrel (PLAVIX) 75 MG tablet, Take 75 mg by mouth daily., Disp: , Rfl:  .   collagenase (SANTYL) ointment, Apply 1 application topically daily., Disp: , Rfl:  .  famotidine (PEPCID) 20 MG tablet, Take 20 mg by mouth at bedtime. , Disp: , Rfl:  .  insulin aspart (NOVOLOG) 100 UNIT/ML injection, Inject 7 Units into the skin 3 (three) times daily with meals., Disp: 10 mL, Rfl: 11 .  insulin glargine (LANTUS) 100 UNIT/ML injection, Inject 24 Units into the skin at bedtime., Disp: , Rfl:  .  metoprolol tartrate (LOPRESSOR) 25 MG tablet, Take 12.5 mg by mouth 2 (two) times daily., Disp: , Rfl:  .  oxyCODONE-acetaminophen (PERCOCET/ROXICET) 5-325 MG tablet, Take 1-2 tablets by mouth every 4 (four) hours as needed for moderate pain or severe pain., Disp: 20 tablet, Rfl: 0 .  pantoprazole (PROTONIX) 40 MG tablet, Take 1 tablet (40 mg total) by mouth daily., Disp: 30 tablet, Rfl: 0 .  promethazine (PHENERGAN) 25 MG tablet, Take 25 mg by mouth every 8 (eight) hours as needed for nausea or vomiting., Disp: , Rfl:  .  sennosides (SENOKOT) 8.8 MG/5ML syrup, Take 5 mLs by mouth every 12 (twelve) hours., Disp: , Rfl:     ALLERGIES   Patient has no known allergies.    REVIEW OF SYSTEMS    Unable to obtain lethargic state in septic shock  PHYSICAL EXAMINATION   Vitals:   January 06, 2019 1531 January 06, 2019 1610  BP:    Pulse: (!) 38   Resp: (!) 37 (!) 0  Temp:    SpO2: 94%      LABS AND IMAGING       LAB RESULTS: Recent Labs  Lab 09/05/18 0338 09/05/18 2240 January 06, 2019 0421  NA 147* 149* 152*  K 5.5* 5.9* 5.2*  CL 114* 116* 117*  CO2 20* 19* 24  BUN 49* 67* 70*  CREATININE 2.19* 2.97* 3.23*  GLUCOSE 458* 407* 309*   Recent Labs  Lab 09/05/18 0338 09/05/18 2240 January 06, 2019 0421  HGB 9.2* 9.2* 9.2*  HCT 30.4* 31.5* 30.9*  WBC 26.9* 18.6* 16.6*  PLT 375 342 317     IMAGING RESULTS: No results found.    ASSESSMENT AND PLAN    -Multidisciplinary rounds held today  Septic shock -bacteremia - MRSE and morganella -complicated by shock liver,severe aki,anasarca  -vasopressors- Levophed and Vaopressin, started dobutamine today due to concomitant HFrEF as well as 1L LR bolus - checking DIC labs today for possible need to deliver cryoprecipitate - s/p Right IJ triple lumen central venous catheter -on Cefepime/Vanco/zithromax -Solucortef 100 bid  -Albumin 25% 1 amp today -discussed with next of kin Bosie ClosJudith Arms and explained that patient is doing worse requiring more vasopressor support and with worsening multiorgan failure despite maximal medical support.     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%  - currently requiring levophed and vasopressin -oxygen as needed -Lasix as tolerated -follow up cardiac enzymes as indicated ICU monitoring  Acute Renal Failure KDIGO stage 3- Likely due to ATN secondary to septic shock -follow chem 7 -follow UO -continue Foley Catheter-assess need daily    GI/Nutrition GI PROPHYLAXIS as indicated DIET-->TF's as tolerated Constipation protocol as indicated   ENDO - ICU hypoglycemic\Hyperglycemia protocol -check FSBS per protocol   ELECTROLYTES -follow labs as needed -replace as needed -pharmacy consultation   DVT/GI PRX ordered -SCDs  TRANSFUSIONS AS NEEDED MONITOR FSBS ASSESS the need for LABS as needed   This document was prepared using Dragon voice recognition software and may include unintentional dictation errors.    Ottie Glazier, M.D.  Division of [place]

## 2018-09-12 NOTE — Progress Notes (Signed)
Patient transitioned to comfort care at 15:25 after arrival of friend from church (pastors unable to make it today and cousin in [place]). Patient passed away at 16:10. Spencer Edwards, and Dr. Lanney Gins notified of death. Patient with no belongings at bedside. CDS notified and patient not a candidate for donation.

## 2018-09-12 NOTE — Progress Notes (Signed)
CRITICAL CARE PROGRESS NOTE       SUBJECTIVE FINDINGS & SIGNIFICANT EVENTS   Patient remains critically ill  Prognosis is very poor  Patient is in septic shock with shock liver/AKI on multiple vasopressors.   Previous blood culture with +MORGANELLA MORGANI and MRSE  Bosie ClosJudith Arms - family contact first  cousin in [phone]  -discussed case in detail, overall poor prognosis.  Discussed patient is on maximal medical therapy with persistent septic shock with multi organ failure.    PAST MEDICAL HISTORY   Past Medical History:  Diagnosis Date  . Acute kidney failure (HCC)   . Anxiety disorder   . Coronary artery disease    Non-ST elevation myocardial infarction in November 2015 in the setting of septic shock and multiorgan failure. Peak troponin was 25. Echo showed normal LV systolic function with inferior and posterior hypokinesis.  . Diabetes mellitus without complication (HCC)   . Dysphagia   . Gastro-esophageal reflux   . Hematuria   . Hyperlipidemia   . Hypernatremia   . Hypertension   . MI (myocardial infarction) (HCC)   . Muscle weakness   . PVD (peripheral vascular disease) (HCC)   . Rhabdomyolysis   . Urinary retention      SURGICAL HISTORY   Past Surgical History:  Procedure Laterality Date  . ABDOMINAL SURGERY    . AMPUTATION Left 08/28/2018   Procedure: AMPUTATION ABOVE KNEE;  Surgeon: Renford DillsSchnier, Gregory G, MD;  Location: [facility] ORS;  Service: Vascular;  Laterality: Left;  . leg amputation       FAMILY HISTORY   Family History  Problem Relation Age of Onset  . Heart Problems Father   . Heart disease Father      SOCIAL HISTORY   Social History   Tobacco Use  . Smoking status: Former Smoker    Types: Cigarettes  . Smokeless tobacco: Never Used  Substance Use  Topics  . Alcohol use: No  . Drug use: No     MEDICATIONS   Current Medication:  Current Facility-Administered Medications:  .  0.45 % sodium chloride infusion, , Intravenous, Continuous, Tukov-Yual, Magdalene S, NP, Last Rate: 10 mL/hr at 09/05/2018 0424 .  acetaminophen (TYLENOL) tablet 650 mg, 650 mg, Oral, Q6H PRN **OR** acetaminophen (TYLENOL) suppository 650 mg, 650 mg, Rectal, Q6H PRN, Oralia ManisWillis, David, MD .  aspirin EC tablet 81 mg, 81 mg, Oral, Daily, Oralia ManisWillis, David, MD, 81 mg at 09/04/18 1048 .  azithromycin (ZITHROMAX) 500 mg in sodium chloride 0.9 % 250 mL IVPB, 500 mg, Intravenous, Q24H, Vida RiggerAleskerov, Monigue Spraggins, MD, Stopped at 09/05/18 [phone] .  benzonatate (TESSALON) capsule 200 mg, 200 mg, Oral, TID PRN, Oralia ManisWillis, David, MD .  ceFEPIme (MAXIPIME) 2 g in sodium chloride 0.9 % 100 mL IVPB, 2 g, Intravenous, Q24H, Hallaji, Sheema M, RPH, Stopped at 09/05/18 2037 .  Chlorhexidine Gluconate Cloth 2 % PADS 6 each, 6 each, Topical, Q0600, Oralia ManisWillis, David, MD, 6 each at 08/14/2018 0425 .  clopidogrel (PLAVIX) tablet 75 mg, 75 mg, Oral, Daily, Oralia ManisWillis, David, MD, 75 mg at 09/04/18 1048 .  dextrose 5 %-0.45 % sodium chloride infusion, , Intravenous, Continuous, Tukov-Yual, Magdalene S, NP, Last Rate: 50 mL/hr at 08/23/2018 0642 .  dextrose 50 % solution 25 mL, 25 mL, Intravenous, PRN, Tukov-Yual, Magdalene S, NP .  guaiFENesin-dextromethorphan (ROBITUSSIN DM) 100-10 MG/5ML syrup 5 mL, 5 mL, Oral, Q4H PRN, Oralia ManisWillis, David, MD .  heparin injection 5,000 Units, 5,000 Units, Subcutaneous, Q8H, Tukov-Yual, Magdalene S, NP .  hydrocortisone  sodium succinate (SOLU-CORTEF) 100 MG injection 100 mg, 100 mg, Intravenous, Q12H, Karna ChristmasAleskerov, Damek Ende, MD, 100 mg at 09/05/18 2007 .  insulin regular, human (MYXREDLIN) 100 units/ 100 mL infusion, , Intravenous, Continuous, Tukov-Yual, Magdalene S, NP, Last Rate: 10.2 mL/hr at 08/31/2018 0642, 10.2 Units/hr at 09/07/2018 0642 .  levalbuterol (XOPENEX) nebulizer solution 1.25 mg, 1.25 mg,  Nebulization, Q6H PRN, Oralia ManisWillis, David, MD .  morphine 2 MG/ML injection 2 mg, 2 mg, Intravenous, Q3H PRN, Tukov-Yual, Magdalene S, NP, 2 mg at 09/05/18 2353 .  mupirocin ointment (BACTROBAN) 2 %, , Nasal, BID, Blakeney, Neldon Newportana G, NP .  norepinephrine (LEVOPHED) 16 mg in 250mL premix infusion, 0-40 mcg/min, Intravenous, Titrated, Mariany Mackintosh, MD, Last Rate: 30.9 mL/hr at 08/18/2018 0642, 33 mcg/min at 08/24/2018 0642 .  ondansetron (ZOFRAN) tablet 4 mg, 4 mg, Oral, Q6H PRN **OR** ondansetron (ZOFRAN) injection 4 mg, 4 mg, Intravenous, Q6H PRN, Oralia ManisWillis, David, MD .  pantoprazole (PROTONIX) injection 40 mg, 40 mg, Intravenous, Q24H, Blakeney, Dana G, NP, 40 mg at 09/01/2018 0358 .  sodium chloride flush (NS) 0.9 % injection 10-40 mL, 10-40 mL, Intracatheter, Q12H, Tukov-Yual, Magdalene S, NP, 10 mL at 09/05/2018 0139 .  sodium chloride flush (NS) 0.9 % injection 10-40 mL, 10-40 mL, Intracatheter, PRN, Tukov-Yual, Magdalene S, NP .  tamsulosin (FLOMAX) capsule 0.4 mg, 0.4 mg, Oral, Daily, Oralia ManisWillis, David, MD, 0.4 mg at 09/04/18 1048 .  vancomycin (VANCOCIN) IVPB 750 mg/150 ml premix, 750 mg, Intravenous, Once, Hallaji, Sheema M, RPH .  vancomycin variable dose per unstable renal function (pharmacist dosing), , Does not apply, See admin instructions, Gardner CandleHallaji, Sheema M, RPH .  vasopressin (PITRESSIN) 40 Units in sodium chloride 0.9 % 250 mL (0.16 Units/mL) infusion, 0.03 Units/min, Intravenous, Continuous, Tukov-Yual, Magdalene S, NP, Last Rate: 11.25 mL/hr at 08/14/2018 0642, 0.03 Units/min at 08/20/2018 [id number]    ALLERGIES   Patient has no known allergies.    REVIEW OF SYSTEMS    Unable to obtain lethargic state in septic shock  PHYSICAL EXAMINATION   Vitals:   08/15/2018 0600 09/01/2018 0630  BP: (!) 88/69 94/62  Pulse: (!) 131 (!) 132  Resp: (!) 35 (!) 36  Temp:    SpO2: 100% 100%    GENERAL: HEAD: Normocephalic, atraumatic.  EYES: Pupils equal, round, reactive to light.  No scleral icterus.   MOUTH: Moist mucosal membrane. NECK: Supple. No thyromegaly. No nodules. No JVD.  PULMONARY: crackles bilaterally  CARDIOVASCULAR: S1 and S2. Regular rate and rhythm. No murmurs, rubs, or gallops.  GASTROINTESTINAL: Soft, nontender, non-distended. No masses. Positive bowel sounds. No hepatosplenomegaly.  MUSCULOSKELETAL: No swelling, clubbing, or edema.  NEUROLOGIC: Mild distress due to acute illness SKIN:intact,warm,dry   LABS AND IMAGING       LAB RESULTS: Recent Labs  Lab 09/05/18 0338 09/05/18 2240 08/13/2018 0421  NA 147* 149* 152*  K 5.5* 5.9* 5.2*  CL 114* 116* 117*  CO2 20* 19* 24  BUN 49* 67* 70*  CREATININE 2.19* 2.97* 3.23*  GLUCOSE 458* 407* 309*   Recent Labs  Lab 09/05/18 0338 09/05/18 2240 09/04/2018 0421  HGB 9.2* 9.2* 9.2*  HCT 30.4* 31.5* 30.9*  WBC 26.9* 18.6* 16.6*  PLT 375 342 317     IMAGING RESULTS: Dg Chest Port 1 View  Result Date: 08/15/2018 CLINICAL DATA:  78 year old male with history of acute respiratory distress. Former smoker. EXAM: PORTABLE CHEST 1 VIEW COMPARISON:  Chest x-ray 09/04/2018. FINDINGS: There is a right-sided internal jugular central venous catheter with tip terminating  in the right atrium. Lung volumes have decreased dramatically. Persistent patchy multifocal airspace consolidation most severe throughout the mid to lower lungs bilaterally. Increasing moderate bilateral pleural effusions. Cardiac silhouette is obscured. Upper mediastinal contours are within normal limits. Aortic atherosclerosis. IMPRESSION: 1. Support apparatus, as above. 2. Persistent multilobar bilateral pneumonia with decreasing lung volumes and moderate bilateral pleural effusions. Electronically Signed   By: Vinnie Langton M.D.   On: 16-Sep-2018 06:45      ASSESSMENT AND PLAN    -Multidisciplinary rounds held today  Septic shock -bacteremia - MRSE and morganella -complicated by shock liver,severe aki,anasarca -vasopressors- Levophed and Vaopressin,  started dobutamine today due to concomitant HFrEF as well as 1L LR bolus - checking DIC labs today for possible need to deliver cryoprecipitate - s/p Right IJ triple lumen central venous catheter -on Cefepime/Vanco/zithromax -Solucortef 100 bid  -Albumin 25% 1 amp today -discussed with next of kin Charlett Nose Arms and explained that patient is doing worse requiring more vasopressor support and with worsening multiorgan failure despite maximal medical support.     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%  - currently requiring levophed and vasopressin -oxygen as needed -Lasix as tolerated -follow up cardiac enzymes as indicated ICU monitoring    Acute Renal Failure KDIGO stage 3- Likely due to ATN secondary to septic shock -follow chem 7 -follow UO -continue Foley Catheter-assess need daily    GI/Nutrition GI PROPHYLAXIS as indicated DIET-->TF's as tolerated Constipation protocol as indicated   ENDO - ICU hypoglycemic\Hyperglycemia protocol -check FSBS per protocol   ELECTROLYTES -follow labs as needed -replace as needed -pharmacy consultation   DVT/GI PRX ordered -SCDs  TRANSFUSIONS AS NEEDED MONITOR FSBS ASSESS the need for LABS as needed   Critical care provider statement:    Critical care time (minutes):  112     Critical care time was exclusive of:  Separately billable procedures and treating other patients   Critical care was necessary to treat or prevent imminent or life-threatening deterioration of the following conditions:  septic shock, acute decompensated systolic CHF, altered mental status,    Critical care was time spent personally by me on the following activities:  Development of treatment plan with  patient or surrogate, discussions with consultants, evaluation of patient's response to treatment, examination of patient, obtaining history from patient or surrogate, ordering and performing treatments and interventions, ordering and review of laboratory studies and re-evaluation of patient's condition.  I assumed direction of critical care for this patient from another provider in my specialty: no    This document was prepared using Dragon voice recognition software and may include unintentional dictation errors.    Ottie Glazier, M.D.  [facility]

## 2018-09-12 NOTE — Care Plan (Signed)
Patient continues to deteriorate despite maximal medical management and is in septic shock with multi-organ failure.   I have reached out to next of kin who is patients cousin and had multiple conversations with her over past 24 hours. Bethena Roys Arms).   We have discussed that despite fully aggressive care unfortunately condition is grim and patient is very likley to pass away.  He is DNR as per his own wishes in past.   I think at this point aggressive measures are potentially innappropriate and Bethena Roys agrees.  His code status is now DNR comfort care and his NOK is agreeable to this. She is thankful for his care. Additionally we are making arrangements for any friends including minister from church to come to bedside to pray and see patient prior to him passing away.     Ottie Glazier, M.D.  [facility]

## 2018-09-12 DEATH — deceased

## 2018-09-21 ENCOUNTER — Ambulatory Visit (INDEPENDENT_AMBULATORY_CARE_PROVIDER_SITE_OTHER): Payer: Medicare Other | Admitting: Vascular Surgery
